# Patient Record
Sex: Female | Born: 1935 | Race: Black or African American | Hispanic: No | Smoking: Never smoker
Health system: Southern US, Community
[De-identification: ages and names within clinical notes are randomized; demographics above are authoritative.]

## PROBLEM LIST (undated history)

## (undated) DIAGNOSIS — I10 Essential (primary) hypertension: Secondary | ICD-10-CM

## (undated) DIAGNOSIS — E119 Type 2 diabetes mellitus without complications: Secondary | ICD-10-CM

## (undated) DIAGNOSIS — N189 Chronic kidney disease, unspecified: Secondary | ICD-10-CM

## (undated) DIAGNOSIS — I4891 Unspecified atrial fibrillation: Secondary | ICD-10-CM

## (undated) DIAGNOSIS — F419 Anxiety disorder, unspecified: Secondary | ICD-10-CM

## (undated) DIAGNOSIS — I509 Heart failure, unspecified: Secondary | ICD-10-CM

## (undated) DIAGNOSIS — I5022 Chronic systolic (congestive) heart failure: Secondary | ICD-10-CM

## (undated) HISTORY — PX: BACK SURGERY: SHX140

## (undated) HISTORY — DX: Unspecified atrial fibrillation: I48.91

## (undated) HISTORY — DX: Chronic kidney disease, unspecified: N18.9

## (undated) HISTORY — DX: Chronic systolic (congestive) heart failure: I50.22

## (undated) HISTORY — PX: EYE SURGERY: SHX253

---

## 2004-10-08 ENCOUNTER — Ambulatory Visit (HOSPITAL_COMMUNITY): Admission: RE | Admit: 2004-10-08 | Discharge: 2004-10-08 | Payer: Self-pay | Admitting: Family Medicine

## 2005-12-29 ENCOUNTER — Ambulatory Visit (HOSPITAL_COMMUNITY): Admission: RE | Admit: 2005-12-29 | Discharge: 2005-12-29 | Payer: Self-pay | Admitting: Family Medicine

## 2006-01-07 ENCOUNTER — Ambulatory Visit (HOSPITAL_COMMUNITY): Admission: RE | Admit: 2006-01-07 | Discharge: 2006-01-08 | Payer: Self-pay | Admitting: Ophthalmology

## 2006-07-06 ENCOUNTER — Emergency Department (HOSPITAL_COMMUNITY): Admission: EM | Admit: 2006-07-06 | Discharge: 2006-07-06 | Payer: Self-pay | Admitting: Emergency Medicine

## 2006-07-14 ENCOUNTER — Ambulatory Visit (HOSPITAL_COMMUNITY): Admission: RE | Admit: 2006-07-14 | Discharge: 2006-07-14 | Payer: Self-pay | Admitting: Family Medicine

## 2006-09-07 ENCOUNTER — Ambulatory Visit (HOSPITAL_COMMUNITY): Admission: RE | Admit: 2006-09-07 | Discharge: 2006-09-08 | Payer: Self-pay | Admitting: Orthopedic Surgery

## 2010-06-10 NOTE — Op Note (Signed)
NAMEEMELEE, KIEPER NO.:  [id number]   MEDICAL RECORD NO.:  [id number]          PATIENT TYPE:  OIB   LOCATION:  [id number]                         FACILITY:  [facility]   PHYSICIAN:  Kipp Brood. Gioffre, M.D.DATE OF BIRTH:  December 04, 1935   DATE OF PROCEDURE:  09/07/2006  DATE OF DISCHARGE:  09/08/2006                               OPERATIVE REPORT   SURGEON:  Jori Moll A. Gladstone Lighter, M.D.   ASSISTANT:  Susa Day, M.D.   PREOPERATIVE DIAGNOSIS:  1. Spinal stenosis with severe stenosis at L5-S1 on the left.  2. Herniated lumbar disc L5-S1 on the left.  This was a foraminal disc      compressing the L5 root.   POSTOPERATIVE DIAGNOSIS:  1. Spinal stenosis with severe stenosis at L5-S1 on the left.  2. Herniated lumbar disc L5-S1 on the left.  This was a foraminal disc      compressing the L5 root.  Note, all her pain was on the left leg      only.   OPERATION:  1. Decompressive lumbar laminectomy at L5-S1 for spinal stenosis.  2. Microdiscectomy and foraminotomy of L5 root and the S1 root at L5-      S1 on the left.  She had a foraminal disc herniation.   PROCEDURE:  Under general anesthesia with the patient on a spinal frame,  routine orthopedic prepping and draping of the back was carried out.  She had 1 gram of IV Ancef preop.  At this time, two needles were placed  on the back for localization purpose and an x-ray was taken.  Once we  identified the L5-S1 interspace, an incision was made.  Bleeders were  identified and cauterized.  We separated the muscle from the spinous  process and the lamina.  Self-retaining retractors were inserted.  Another x-ray was taken to verify our position.  Once we established the  L5-S1 interspace, we then carried out a decompressive lumbar laminectomy  and decompressed the lateral recess.  She had rather significant lateral  recess stenosis on the left.  We did foraminotomy to follow the L5 root  out as well as the foraminotomies for the  S1 root.  We cauterized the  lateral recess veins.  We gently retracted the dura, identified the  root, and then made a cruciate incision into the disc space.  At this  time, we went out far laterally under the root of L5 to decompress the  L5 root.  We did a microdiscectomy.  We then went down into the space  per se and cleaned the space out, as well.  Once the procedure was  completed, we were able to easily pass a hockey stick out the foramina  of both L5 roots and S1 roots on the left.  We had a nice decompression.  We thoroughly irrigated out the area.  Good hemostasis was maintained.  We did check the dura to make sure there were no leaks and there were  none at the time of surgery.  We then loosely applied thrombin soaked  Gelfoam and closed the wound in layers  in the usual fashion.  Note, I  left a small portion of the proximal and distal deep portions of the  wound open for  drainage purposes.  The subcu was closed with 0 Vicryl.  The skin was  closed with metal staples.  A sterile Neosporin dressing was applied.  The patient left the operating room in satisfactory condition.  She had  15 mg IV of Toradol before leaving surgery.           ______________________________  Kipp Brood Gladstone Lighter, M.D.     RAG/MEDQ  D:  09/07/2006  T:  09/08/2006  Job:  [id number]

## 2010-06-13 NOTE — Op Note (Signed)
Barbara Stone, THURNER NO.:  [id number]   MEDICAL RECORD NO.:  [id number]          PATIENT TYPE:  OIB   LOCATION:  [id number]                         FACILITY:  [facility]   PHYSICIAN:  Chrystie Nose. Zigmund Daniel, M.D. DATE OF BIRTH:  Aug 28, 1935   DATE OF PROCEDURE:  01/07/2006  DATE OF DISCHARGE:                               OPERATIVE REPORT   ADMISSION DIAGNOSIS:  Vitreous hemorrhage, proliferative diabetic  retinopathy, traction retinal detachment, neovascularization, left eye.   PROCEDURES:  Pars plana vitrectomy with retinal photocoagulation,  membrane peel, left eye.   SURGEON:  Chrystie Nose. Zigmund Daniel, M.D.   ASSISTANT:  Deatra Ina, MA   ANESTHESIA:  General.   DETAILS:  Usual prep and drape, 25 gauge trocars placed at 10, 2, and 4  o'clock.  Infusion at 4 o'clock.  Provisc placed on the corneal surface.  BIOM viewing system moved into place.  Pars plana vitrectomy was begun  just behind the cataractous lens.  The vitrectomy was carried down  posteriorly to the retinal surface where several areas of traction  detachment were seen on the lower arcade, the upper arcade, and nasally.  At this point, it was decided to convert to 20 gauge in order to have  increased flow and access to 20 gauge  instruments.  The trocars were  removed and held until tight.  Conjunctival peritomies were made at 10,  2 and 4 o'clock.  The 5 mm infusion port anchored into place at 4  o'clock.  The pars plana vitrectomy was then continued with the lighted  pick and the 20 gauge vitreous cutter.  The traction detachment hyaloid  was incised along the lower arcade, along the upper arcade, and nasally.  The hyaloid was teased away from its attachments to the vascular  arcades.  Large clots of blood were encountered and these were carefully  removed under low suction and rapid cutting.  The [place] ophthalmics  brush was used for vacuuming of blood from the surface of the retina.  Each area of traction was  carefully removed with scissors, forceps, and  vitreous cutters. Once all blood and all traction was removed, the  endolaser was positioned in the eye. 1215 burns were placed around the  retinal periphery with a power 1000 milliwatts, 1000 microns each, and  0.1 seconds each.  The blood was vacuumed from the macular surface with  the [place] ophthalmics brush. The vitreous cavity was clear.  The  instruments were removed from the eye.  A 9-0 nylon was used to close  the sclerotomy sites.  The conjunctiva was closed with wet field  cautery.  The wounds were tested and found to be tight.  Polymyxin and  gentamicin were irrigated into Ten[place]'s space.  Atropine solution was  applied.  Marcaine was injected around the globe for postop pain.  Decadron 10 mg was injected to the lower subconjunctival space.  TobraDex ophthalmic ointment, a patch and shield were placed.  Closing  pressure was 15 with a Production manager.  Complications none.  Duration one hour.  The patient was awakened and taken to recovery  in  satisfactory condition.      Chrystie Nose. Zigmund Daniel, M.D.  Electronically Signed    JDM/MEDQ  D:  01/07/2006  T:  01/07/2006  Job:  [id number]

## 2010-08-25 ENCOUNTER — Encounter (INDEPENDENT_AMBULATORY_CARE_PROVIDER_SITE_OTHER): Payer: Medicare Other | Admitting: Ophthalmology

## 2010-08-25 DIAGNOSIS — E11311 Type 2 diabetes mellitus with unspecified diabetic retinopathy with macular edema: Secondary | ICD-10-CM

## 2010-09-10 ENCOUNTER — Encounter (INDEPENDENT_AMBULATORY_CARE_PROVIDER_SITE_OTHER): Payer: Medicare Other | Admitting: Ophthalmology

## 2010-09-10 DIAGNOSIS — H43819 Vitreous degeneration, unspecified eye: Secondary | ICD-10-CM

## 2010-09-10 DIAGNOSIS — E11311 Type 2 diabetes mellitus with unspecified diabetic retinopathy with macular edema: Secondary | ICD-10-CM

## 2010-09-10 DIAGNOSIS — H2510 Age-related nuclear cataract, unspecified eye: Secondary | ICD-10-CM

## 2010-09-10 DIAGNOSIS — E11359 Type 2 diabetes mellitus with proliferative diabetic retinopathy without macular edema: Secondary | ICD-10-CM

## 2010-09-24 ENCOUNTER — Encounter (INDEPENDENT_AMBULATORY_CARE_PROVIDER_SITE_OTHER): Payer: Medicare Other | Admitting: Ophthalmology

## 2010-09-24 DIAGNOSIS — H3581 Retinal edema: Secondary | ICD-10-CM

## 2010-09-24 DIAGNOSIS — H348392 Tributary (branch) retinal vein occlusion, unspecified eye, stable: Secondary | ICD-10-CM

## 2010-10-08 ENCOUNTER — Encounter (INDEPENDENT_AMBULATORY_CARE_PROVIDER_SITE_OTHER): Payer: Medicare Other | Admitting: Ophthalmology

## 2010-10-08 DIAGNOSIS — E11311 Type 2 diabetes mellitus with unspecified diabetic retinopathy with macular edema: Secondary | ICD-10-CM

## 2010-11-05 ENCOUNTER — Encounter (INDEPENDENT_AMBULATORY_CARE_PROVIDER_SITE_OTHER): Payer: Medicare Other | Admitting: Ophthalmology

## 2010-11-05 DIAGNOSIS — E11311 Type 2 diabetes mellitus with unspecified diabetic retinopathy with macular edema: Secondary | ICD-10-CM

## 2010-11-05 DIAGNOSIS — H43819 Vitreous degeneration, unspecified eye: Secondary | ICD-10-CM

## 2010-11-05 DIAGNOSIS — E11359 Type 2 diabetes mellitus with proliferative diabetic retinopathy without macular edema: Secondary | ICD-10-CM

## 2010-11-10 LAB — BASIC METABOLIC PANEL
CO2: 26
Chloride: 108
GFR calc Af Amer: 55 — ABNORMAL LOW
Potassium: 3.9
Sodium: 143

## 2010-11-10 LAB — URINALYSIS, ROUTINE W REFLEX MICROSCOPIC
Hgb urine dipstick: NEGATIVE
Nitrite: NEGATIVE
Specific Gravity, Urine: 1.026
Urobilinogen, UA: 0.2
pH: 5

## 2010-11-10 LAB — PROTIME-INR: INR: 0.9

## 2010-11-10 LAB — APTT: aPTT: 23 — ABNORMAL LOW

## 2010-11-10 LAB — HEMOGLOBIN AND HEMATOCRIT, BLOOD: Hemoglobin: 12.7

## 2010-11-19 ENCOUNTER — Encounter (INDEPENDENT_AMBULATORY_CARE_PROVIDER_SITE_OTHER): Payer: Medicare Other | Admitting: Ophthalmology

## 2010-11-19 DIAGNOSIS — E11359 Type 2 diabetes mellitus with proliferative diabetic retinopathy without macular edema: Secondary | ICD-10-CM

## 2010-11-19 DIAGNOSIS — E11311 Type 2 diabetes mellitus with unspecified diabetic retinopathy with macular edema: Secondary | ICD-10-CM

## 2010-11-19 DIAGNOSIS — H2510 Age-related nuclear cataract, unspecified eye: Secondary | ICD-10-CM

## 2010-11-19 DIAGNOSIS — E1139 Type 2 diabetes mellitus with other diabetic ophthalmic complication: Secondary | ICD-10-CM

## 2010-11-19 DIAGNOSIS — H43819 Vitreous degeneration, unspecified eye: Secondary | ICD-10-CM

## 2010-12-17 ENCOUNTER — Encounter (INDEPENDENT_AMBULATORY_CARE_PROVIDER_SITE_OTHER): Payer: Medicare Other | Admitting: Ophthalmology

## 2010-12-17 DIAGNOSIS — E11359 Type 2 diabetes mellitus with proliferative diabetic retinopathy without macular edema: Secondary | ICD-10-CM

## 2010-12-17 DIAGNOSIS — E1139 Type 2 diabetes mellitus with other diabetic ophthalmic complication: Secondary | ICD-10-CM

## 2010-12-17 DIAGNOSIS — H43819 Vitreous degeneration, unspecified eye: Secondary | ICD-10-CM

## 2010-12-17 DIAGNOSIS — E11311 Type 2 diabetes mellitus with unspecified diabetic retinopathy with macular edema: Secondary | ICD-10-CM

## 2010-12-31 ENCOUNTER — Encounter (INDEPENDENT_AMBULATORY_CARE_PROVIDER_SITE_OTHER): Payer: Medicare Other | Admitting: Ophthalmology

## 2010-12-31 DIAGNOSIS — E11359 Type 2 diabetes mellitus with proliferative diabetic retinopathy without macular edema: Secondary | ICD-10-CM

## 2010-12-31 DIAGNOSIS — H43819 Vitreous degeneration, unspecified eye: Secondary | ICD-10-CM

## 2010-12-31 DIAGNOSIS — E1139 Type 2 diabetes mellitus with other diabetic ophthalmic complication: Secondary | ICD-10-CM

## 2010-12-31 DIAGNOSIS — E11311 Type 2 diabetes mellitus with unspecified diabetic retinopathy with macular edema: Secondary | ICD-10-CM

## 2011-01-28 ENCOUNTER — Encounter (INDEPENDENT_AMBULATORY_CARE_PROVIDER_SITE_OTHER): Payer: Medicare Other | Admitting: Ophthalmology

## 2011-01-28 DIAGNOSIS — H43819 Vitreous degeneration, unspecified eye: Secondary | ICD-10-CM

## 2011-01-28 DIAGNOSIS — H2510 Age-related nuclear cataract, unspecified eye: Secondary | ICD-10-CM

## 2011-01-28 DIAGNOSIS — E11359 Type 2 diabetes mellitus with proliferative diabetic retinopathy without macular edema: Secondary | ICD-10-CM

## 2011-01-28 DIAGNOSIS — E11311 Type 2 diabetes mellitus with unspecified diabetic retinopathy with macular edema: Secondary | ICD-10-CM

## 2011-01-28 DIAGNOSIS — E1165 Type 2 diabetes mellitus with hyperglycemia: Secondary | ICD-10-CM

## 2011-02-11 ENCOUNTER — Encounter (INDEPENDENT_AMBULATORY_CARE_PROVIDER_SITE_OTHER): Payer: Medicare Other | Admitting: Ophthalmology

## 2011-02-11 DIAGNOSIS — E1139 Type 2 diabetes mellitus with other diabetic ophthalmic complication: Secondary | ICD-10-CM

## 2011-02-11 DIAGNOSIS — E11359 Type 2 diabetes mellitus with proliferative diabetic retinopathy without macular edema: Secondary | ICD-10-CM

## 2011-02-11 DIAGNOSIS — E11311 Type 2 diabetes mellitus with unspecified diabetic retinopathy with macular edema: Secondary | ICD-10-CM

## 2011-02-11 DIAGNOSIS — H43819 Vitreous degeneration, unspecified eye: Secondary | ICD-10-CM

## 2011-02-25 ENCOUNTER — Encounter (INDEPENDENT_AMBULATORY_CARE_PROVIDER_SITE_OTHER): Payer: Medicare Other | Admitting: Ophthalmology

## 2011-02-25 DIAGNOSIS — H43819 Vitreous degeneration, unspecified eye: Secondary | ICD-10-CM

## 2011-02-25 DIAGNOSIS — E1139 Type 2 diabetes mellitus with other diabetic ophthalmic complication: Secondary | ICD-10-CM

## 2011-02-25 DIAGNOSIS — E11359 Type 2 diabetes mellitus with proliferative diabetic retinopathy without macular edema: Secondary | ICD-10-CM

## 2011-02-25 DIAGNOSIS — E11311 Type 2 diabetes mellitus with unspecified diabetic retinopathy with macular edema: Secondary | ICD-10-CM

## 2011-03-09 ENCOUNTER — Encounter (INDEPENDENT_AMBULATORY_CARE_PROVIDER_SITE_OTHER): Payer: Medicare Other | Admitting: Ophthalmology

## 2011-03-16 ENCOUNTER — Encounter (INDEPENDENT_AMBULATORY_CARE_PROVIDER_SITE_OTHER): Payer: Medicare Other | Admitting: Ophthalmology

## 2011-03-16 DIAGNOSIS — E11359 Type 2 diabetes mellitus with proliferative diabetic retinopathy without macular edema: Secondary | ICD-10-CM

## 2011-03-16 DIAGNOSIS — H2510 Age-related nuclear cataract, unspecified eye: Secondary | ICD-10-CM

## 2011-03-16 DIAGNOSIS — E1139 Type 2 diabetes mellitus with other diabetic ophthalmic complication: Secondary | ICD-10-CM

## 2011-03-16 DIAGNOSIS — E11311 Type 2 diabetes mellitus with unspecified diabetic retinopathy with macular edema: Secondary | ICD-10-CM

## 2011-03-16 DIAGNOSIS — H43819 Vitreous degeneration, unspecified eye: Secondary | ICD-10-CM

## 2011-03-25 ENCOUNTER — Encounter (INDEPENDENT_AMBULATORY_CARE_PROVIDER_SITE_OTHER): Payer: Medicare Other | Admitting: Ophthalmology

## 2011-03-25 DIAGNOSIS — E11359 Type 2 diabetes mellitus with proliferative diabetic retinopathy without macular edema: Secondary | ICD-10-CM

## 2011-03-25 DIAGNOSIS — E11311 Type 2 diabetes mellitus with unspecified diabetic retinopathy with macular edema: Secondary | ICD-10-CM

## 2011-03-25 DIAGNOSIS — H43819 Vitreous degeneration, unspecified eye: Secondary | ICD-10-CM

## 2011-03-25 DIAGNOSIS — E1165 Type 2 diabetes mellitus with hyperglycemia: Secondary | ICD-10-CM

## 2011-03-25 DIAGNOSIS — H2510 Age-related nuclear cataract, unspecified eye: Secondary | ICD-10-CM

## 2011-04-13 ENCOUNTER — Encounter (INDEPENDENT_AMBULATORY_CARE_PROVIDER_SITE_OTHER): Payer: Medicare Other | Admitting: Ophthalmology

## 2011-04-13 DIAGNOSIS — H43819 Vitreous degeneration, unspecified eye: Secondary | ICD-10-CM

## 2011-04-13 DIAGNOSIS — E11359 Type 2 diabetes mellitus with proliferative diabetic retinopathy without macular edema: Secondary | ICD-10-CM

## 2011-04-13 DIAGNOSIS — E1139 Type 2 diabetes mellitus with other diabetic ophthalmic complication: Secondary | ICD-10-CM

## 2011-04-13 DIAGNOSIS — E11311 Type 2 diabetes mellitus with unspecified diabetic retinopathy with macular edema: Secondary | ICD-10-CM

## 2011-05-06 ENCOUNTER — Encounter (INDEPENDENT_AMBULATORY_CARE_PROVIDER_SITE_OTHER): Payer: Medicare Other | Admitting: Ophthalmology

## 2011-05-07 ENCOUNTER — Encounter (INDEPENDENT_AMBULATORY_CARE_PROVIDER_SITE_OTHER): Payer: Medicare Other | Admitting: Ophthalmology

## 2011-05-07 DIAGNOSIS — E1139 Type 2 diabetes mellitus with other diabetic ophthalmic complication: Secondary | ICD-10-CM

## 2011-05-07 DIAGNOSIS — E11359 Type 2 diabetes mellitus with proliferative diabetic retinopathy without macular edema: Secondary | ICD-10-CM

## 2011-05-07 DIAGNOSIS — H43819 Vitreous degeneration, unspecified eye: Secondary | ICD-10-CM

## 2011-05-07 DIAGNOSIS — E11311 Type 2 diabetes mellitus with unspecified diabetic retinopathy with macular edema: Secondary | ICD-10-CM

## 2011-05-11 ENCOUNTER — Encounter (INDEPENDENT_AMBULATORY_CARE_PROVIDER_SITE_OTHER): Payer: Medicare Other | Admitting: Ophthalmology

## 2011-05-11 DIAGNOSIS — H43819 Vitreous degeneration, unspecified eye: Secondary | ICD-10-CM

## 2011-05-11 DIAGNOSIS — E1165 Type 2 diabetes mellitus with hyperglycemia: Secondary | ICD-10-CM

## 2011-05-11 DIAGNOSIS — E11359 Type 2 diabetes mellitus with proliferative diabetic retinopathy without macular edema: Secondary | ICD-10-CM

## 2011-05-11 DIAGNOSIS — E11311 Type 2 diabetes mellitus with unspecified diabetic retinopathy with macular edema: Secondary | ICD-10-CM

## 2011-06-08 ENCOUNTER — Encounter (INDEPENDENT_AMBULATORY_CARE_PROVIDER_SITE_OTHER): Payer: Medicare Other | Admitting: Ophthalmology

## 2011-06-08 DIAGNOSIS — E11311 Type 2 diabetes mellitus with unspecified diabetic retinopathy with macular edema: Secondary | ICD-10-CM

## 2011-06-08 DIAGNOSIS — E1165 Type 2 diabetes mellitus with hyperglycemia: Secondary | ICD-10-CM

## 2011-06-17 ENCOUNTER — Encounter (INDEPENDENT_AMBULATORY_CARE_PROVIDER_SITE_OTHER): Payer: Medicare Other | Admitting: Ophthalmology

## 2011-06-23 ENCOUNTER — Encounter (INDEPENDENT_AMBULATORY_CARE_PROVIDER_SITE_OTHER): Payer: Medicare Other | Admitting: Ophthalmology

## 2011-06-23 DIAGNOSIS — H43819 Vitreous degeneration, unspecified eye: Secondary | ICD-10-CM

## 2011-06-23 DIAGNOSIS — E11359 Type 2 diabetes mellitus with proliferative diabetic retinopathy without macular edema: Secondary | ICD-10-CM

## 2011-06-23 DIAGNOSIS — E11311 Type 2 diabetes mellitus with unspecified diabetic retinopathy with macular edema: Secondary | ICD-10-CM

## 2011-06-23 DIAGNOSIS — E1139 Type 2 diabetes mellitus with other diabetic ophthalmic complication: Secondary | ICD-10-CM

## 2011-06-23 DIAGNOSIS — H2510 Age-related nuclear cataract, unspecified eye: Secondary | ICD-10-CM

## 2011-07-06 ENCOUNTER — Encounter (INDEPENDENT_AMBULATORY_CARE_PROVIDER_SITE_OTHER): Payer: Medicare Other | Admitting: Ophthalmology

## 2011-07-06 DIAGNOSIS — E1165 Type 2 diabetes mellitus with hyperglycemia: Secondary | ICD-10-CM

## 2011-07-06 DIAGNOSIS — H43819 Vitreous degeneration, unspecified eye: Secondary | ICD-10-CM

## 2011-07-06 DIAGNOSIS — E1139 Type 2 diabetes mellitus with other diabetic ophthalmic complication: Secondary | ICD-10-CM

## 2011-07-06 DIAGNOSIS — E11359 Type 2 diabetes mellitus with proliferative diabetic retinopathy without macular edema: Secondary | ICD-10-CM

## 2011-07-06 DIAGNOSIS — H2510 Age-related nuclear cataract, unspecified eye: Secondary | ICD-10-CM

## 2011-07-06 DIAGNOSIS — E11311 Type 2 diabetes mellitus with unspecified diabetic retinopathy with macular edema: Secondary | ICD-10-CM

## 2011-08-05 ENCOUNTER — Encounter (INDEPENDENT_AMBULATORY_CARE_PROVIDER_SITE_OTHER): Payer: Medicare Other | Admitting: Ophthalmology

## 2011-08-07 ENCOUNTER — Encounter (INDEPENDENT_AMBULATORY_CARE_PROVIDER_SITE_OTHER): Payer: Medicare Other | Admitting: Ophthalmology

## 2011-08-07 DIAGNOSIS — I10 Essential (primary) hypertension: Secondary | ICD-10-CM

## 2011-08-07 DIAGNOSIS — E11359 Type 2 diabetes mellitus with proliferative diabetic retinopathy without macular edema: Secondary | ICD-10-CM

## 2011-08-07 DIAGNOSIS — H35039 Hypertensive retinopathy, unspecified eye: Secondary | ICD-10-CM

## 2011-08-07 DIAGNOSIS — E11311 Type 2 diabetes mellitus with unspecified diabetic retinopathy with macular edema: Secondary | ICD-10-CM

## 2011-08-07 DIAGNOSIS — H43399 Other vitreous opacities, unspecified eye: Secondary | ICD-10-CM

## 2011-08-07 DIAGNOSIS — E1165 Type 2 diabetes mellitus with hyperglycemia: Secondary | ICD-10-CM

## 2011-08-07 DIAGNOSIS — H2510 Age-related nuclear cataract, unspecified eye: Secondary | ICD-10-CM

## 2011-08-10 ENCOUNTER — Encounter (INDEPENDENT_AMBULATORY_CARE_PROVIDER_SITE_OTHER): Payer: Medicare Other | Admitting: Ophthalmology

## 2011-08-10 DIAGNOSIS — E11359 Type 2 diabetes mellitus with proliferative diabetic retinopathy without macular edema: Secondary | ICD-10-CM

## 2011-08-10 DIAGNOSIS — E11311 Type 2 diabetes mellitus with unspecified diabetic retinopathy with macular edema: Secondary | ICD-10-CM

## 2011-08-10 DIAGNOSIS — E1139 Type 2 diabetes mellitus with other diabetic ophthalmic complication: Secondary | ICD-10-CM

## 2011-08-10 DIAGNOSIS — H35039 Hypertensive retinopathy, unspecified eye: Secondary | ICD-10-CM

## 2011-08-10 DIAGNOSIS — I10 Essential (primary) hypertension: Secondary | ICD-10-CM

## 2011-09-14 ENCOUNTER — Encounter (INDEPENDENT_AMBULATORY_CARE_PROVIDER_SITE_OTHER): Payer: Medicare Other | Admitting: Ophthalmology

## 2011-09-14 DIAGNOSIS — H35379 Puckering of macula, unspecified eye: Secondary | ICD-10-CM

## 2011-09-14 DIAGNOSIS — H35039 Hypertensive retinopathy, unspecified eye: Secondary | ICD-10-CM

## 2011-09-14 DIAGNOSIS — E11311 Type 2 diabetes mellitus with unspecified diabetic retinopathy with macular edema: Secondary | ICD-10-CM

## 2011-09-14 DIAGNOSIS — E11359 Type 2 diabetes mellitus with proliferative diabetic retinopathy without macular edema: Secondary | ICD-10-CM

## 2011-09-14 DIAGNOSIS — I10 Essential (primary) hypertension: Secondary | ICD-10-CM

## 2011-09-14 DIAGNOSIS — H2510 Age-related nuclear cataract, unspecified eye: Secondary | ICD-10-CM

## 2011-09-14 DIAGNOSIS — H43819 Vitreous degeneration, unspecified eye: Secondary | ICD-10-CM

## 2011-09-14 DIAGNOSIS — E1139 Type 2 diabetes mellitus with other diabetic ophthalmic complication: Secondary | ICD-10-CM

## 2011-09-18 ENCOUNTER — Encounter (INDEPENDENT_AMBULATORY_CARE_PROVIDER_SITE_OTHER): Payer: Medicare Other | Admitting: Ophthalmology

## 2011-09-21 ENCOUNTER — Encounter (INDEPENDENT_AMBULATORY_CARE_PROVIDER_SITE_OTHER): Payer: Medicare Other | Admitting: Ophthalmology

## 2011-09-21 DIAGNOSIS — E1139 Type 2 diabetes mellitus with other diabetic ophthalmic complication: Secondary | ICD-10-CM

## 2011-09-21 DIAGNOSIS — E11359 Type 2 diabetes mellitus with proliferative diabetic retinopathy without macular edema: Secondary | ICD-10-CM

## 2011-09-21 DIAGNOSIS — H35039 Hypertensive retinopathy, unspecified eye: Secondary | ICD-10-CM

## 2011-09-21 DIAGNOSIS — I10 Essential (primary) hypertension: Secondary | ICD-10-CM

## 2011-09-21 DIAGNOSIS — H43819 Vitreous degeneration, unspecified eye: Secondary | ICD-10-CM

## 2011-09-21 DIAGNOSIS — E11311 Type 2 diabetes mellitus with unspecified diabetic retinopathy with macular edema: Secondary | ICD-10-CM

## 2011-10-19 ENCOUNTER — Encounter (INDEPENDENT_AMBULATORY_CARE_PROVIDER_SITE_OTHER): Payer: Medicare Other | Admitting: Ophthalmology

## 2011-10-19 DIAGNOSIS — H2510 Age-related nuclear cataract, unspecified eye: Secondary | ICD-10-CM

## 2011-10-19 DIAGNOSIS — I10 Essential (primary) hypertension: Secondary | ICD-10-CM

## 2011-10-19 DIAGNOSIS — E11359 Type 2 diabetes mellitus with proliferative diabetic retinopathy without macular edema: Secondary | ICD-10-CM

## 2011-10-19 DIAGNOSIS — H43819 Vitreous degeneration, unspecified eye: Secondary | ICD-10-CM

## 2011-10-19 DIAGNOSIS — H35039 Hypertensive retinopathy, unspecified eye: Secondary | ICD-10-CM

## 2011-10-19 DIAGNOSIS — E11311 Type 2 diabetes mellitus with unspecified diabetic retinopathy with macular edema: Secondary | ICD-10-CM

## 2011-10-19 DIAGNOSIS — E1165 Type 2 diabetes mellitus with hyperglycemia: Secondary | ICD-10-CM

## 2011-11-02 ENCOUNTER — Encounter (INDEPENDENT_AMBULATORY_CARE_PROVIDER_SITE_OTHER): Payer: Medicare Other | Admitting: Ophthalmology

## 2011-11-04 ENCOUNTER — Encounter (INDEPENDENT_AMBULATORY_CARE_PROVIDER_SITE_OTHER): Payer: Medicare Other | Admitting: Ophthalmology

## 2011-11-04 DIAGNOSIS — H35039 Hypertensive retinopathy, unspecified eye: Secondary | ICD-10-CM

## 2011-11-04 DIAGNOSIS — E11311 Type 2 diabetes mellitus with unspecified diabetic retinopathy with macular edema: Secondary | ICD-10-CM

## 2011-11-04 DIAGNOSIS — H43819 Vitreous degeneration, unspecified eye: Secondary | ICD-10-CM

## 2011-11-04 DIAGNOSIS — I10 Essential (primary) hypertension: Secondary | ICD-10-CM

## 2011-11-04 DIAGNOSIS — E11359 Type 2 diabetes mellitus with proliferative diabetic retinopathy without macular edema: Secondary | ICD-10-CM

## 2011-11-04 DIAGNOSIS — E1165 Type 2 diabetes mellitus with hyperglycemia: Secondary | ICD-10-CM

## 2011-11-05 ENCOUNTER — Encounter (INDEPENDENT_AMBULATORY_CARE_PROVIDER_SITE_OTHER): Payer: Medicare Other | Admitting: Ophthalmology

## 2011-11-20 ENCOUNTER — Encounter (INDEPENDENT_AMBULATORY_CARE_PROVIDER_SITE_OTHER): Payer: Medicare Other | Admitting: Ophthalmology

## 2011-11-20 DIAGNOSIS — E11311 Type 2 diabetes mellitus with unspecified diabetic retinopathy with macular edema: Secondary | ICD-10-CM

## 2011-11-20 DIAGNOSIS — H43819 Vitreous degeneration, unspecified eye: Secondary | ICD-10-CM

## 2011-11-20 DIAGNOSIS — H35039 Hypertensive retinopathy, unspecified eye: Secondary | ICD-10-CM

## 2011-11-20 DIAGNOSIS — E11359 Type 2 diabetes mellitus with proliferative diabetic retinopathy without macular edema: Secondary | ICD-10-CM

## 2011-11-20 DIAGNOSIS — H2510 Age-related nuclear cataract, unspecified eye: Secondary | ICD-10-CM

## 2011-11-20 DIAGNOSIS — E1165 Type 2 diabetes mellitus with hyperglycemia: Secondary | ICD-10-CM

## 2011-11-20 DIAGNOSIS — I10 Essential (primary) hypertension: Secondary | ICD-10-CM

## 2011-11-23 ENCOUNTER — Encounter (INDEPENDENT_AMBULATORY_CARE_PROVIDER_SITE_OTHER): Payer: Medicare Other | Admitting: Ophthalmology

## 2011-12-16 ENCOUNTER — Encounter (INDEPENDENT_AMBULATORY_CARE_PROVIDER_SITE_OTHER): Payer: Medicare Other | Admitting: Ophthalmology

## 2011-12-18 ENCOUNTER — Encounter (INDEPENDENT_AMBULATORY_CARE_PROVIDER_SITE_OTHER): Payer: Medicare Other | Admitting: Ophthalmology

## 2011-12-18 DIAGNOSIS — E1165 Type 2 diabetes mellitus with hyperglycemia: Secondary | ICD-10-CM

## 2011-12-18 DIAGNOSIS — I10 Essential (primary) hypertension: Secondary | ICD-10-CM

## 2011-12-18 DIAGNOSIS — H35039 Hypertensive retinopathy, unspecified eye: Secondary | ICD-10-CM

## 2011-12-18 DIAGNOSIS — H2510 Age-related nuclear cataract, unspecified eye: Secondary | ICD-10-CM

## 2011-12-18 DIAGNOSIS — E11359 Type 2 diabetes mellitus with proliferative diabetic retinopathy without macular edema: Secondary | ICD-10-CM

## 2011-12-18 DIAGNOSIS — E11311 Type 2 diabetes mellitus with unspecified diabetic retinopathy with macular edema: Secondary | ICD-10-CM

## 2011-12-18 DIAGNOSIS — H43819 Vitreous degeneration, unspecified eye: Secondary | ICD-10-CM

## 2011-12-21 ENCOUNTER — Encounter (INDEPENDENT_AMBULATORY_CARE_PROVIDER_SITE_OTHER): Payer: Medicare Other | Admitting: Ophthalmology

## 2011-12-21 DIAGNOSIS — E11359 Type 2 diabetes mellitus with proliferative diabetic retinopathy without macular edema: Secondary | ICD-10-CM

## 2011-12-21 DIAGNOSIS — E1139 Type 2 diabetes mellitus with other diabetic ophthalmic complication: Secondary | ICD-10-CM

## 2011-12-21 DIAGNOSIS — E11311 Type 2 diabetes mellitus with unspecified diabetic retinopathy with macular edema: Secondary | ICD-10-CM

## 2011-12-21 DIAGNOSIS — I10 Essential (primary) hypertension: Secondary | ICD-10-CM

## 2011-12-21 DIAGNOSIS — H2510 Age-related nuclear cataract, unspecified eye: Secondary | ICD-10-CM

## 2011-12-21 DIAGNOSIS — H43819 Vitreous degeneration, unspecified eye: Secondary | ICD-10-CM

## 2011-12-21 DIAGNOSIS — H35039 Hypertensive retinopathy, unspecified eye: Secondary | ICD-10-CM

## 2012-01-29 ENCOUNTER — Encounter (INDEPENDENT_AMBULATORY_CARE_PROVIDER_SITE_OTHER): Payer: Medicare Other | Admitting: Ophthalmology

## 2012-01-29 DIAGNOSIS — I10 Essential (primary) hypertension: Secondary | ICD-10-CM

## 2012-01-29 DIAGNOSIS — H43819 Vitreous degeneration, unspecified eye: Secondary | ICD-10-CM

## 2012-01-29 DIAGNOSIS — E11311 Type 2 diabetes mellitus with unspecified diabetic retinopathy with macular edema: Secondary | ICD-10-CM

## 2012-01-29 DIAGNOSIS — E1139 Type 2 diabetes mellitus with other diabetic ophthalmic complication: Secondary | ICD-10-CM

## 2012-01-29 DIAGNOSIS — H35039 Hypertensive retinopathy, unspecified eye: Secondary | ICD-10-CM

## 2012-01-29 DIAGNOSIS — E11359 Type 2 diabetes mellitus with proliferative diabetic retinopathy without macular edema: Secondary | ICD-10-CM

## 2012-02-22 ENCOUNTER — Encounter (INDEPENDENT_AMBULATORY_CARE_PROVIDER_SITE_OTHER): Payer: Medicare Other | Admitting: Ophthalmology

## 2012-02-22 DIAGNOSIS — I10 Essential (primary) hypertension: Secondary | ICD-10-CM

## 2012-02-22 DIAGNOSIS — E1139 Type 2 diabetes mellitus with other diabetic ophthalmic complication: Secondary | ICD-10-CM

## 2012-02-22 DIAGNOSIS — E11359 Type 2 diabetes mellitus with proliferative diabetic retinopathy without macular edema: Secondary | ICD-10-CM

## 2012-02-22 DIAGNOSIS — H2510 Age-related nuclear cataract, unspecified eye: Secondary | ICD-10-CM

## 2012-02-22 DIAGNOSIS — H35039 Hypertensive retinopathy, unspecified eye: Secondary | ICD-10-CM

## 2012-02-22 DIAGNOSIS — H43819 Vitreous degeneration, unspecified eye: Secondary | ICD-10-CM

## 2012-02-22 DIAGNOSIS — E11311 Type 2 diabetes mellitus with unspecified diabetic retinopathy with macular edema: Secondary | ICD-10-CM

## 2012-03-14 ENCOUNTER — Encounter (INDEPENDENT_AMBULATORY_CARE_PROVIDER_SITE_OTHER): Payer: Medicare Other | Admitting: Ophthalmology

## 2012-03-17 ENCOUNTER — Encounter (INDEPENDENT_AMBULATORY_CARE_PROVIDER_SITE_OTHER): Payer: Medicare Other | Admitting: Ophthalmology

## 2012-03-21 ENCOUNTER — Encounter (INDEPENDENT_AMBULATORY_CARE_PROVIDER_SITE_OTHER): Payer: Medicare Other | Admitting: Ophthalmology

## 2012-03-21 DIAGNOSIS — H35039 Hypertensive retinopathy, unspecified eye: Secondary | ICD-10-CM

## 2012-03-21 DIAGNOSIS — E11359 Type 2 diabetes mellitus with proliferative diabetic retinopathy without macular edema: Secondary | ICD-10-CM

## 2012-03-21 DIAGNOSIS — I10 Essential (primary) hypertension: Secondary | ICD-10-CM

## 2012-03-21 DIAGNOSIS — E1139 Type 2 diabetes mellitus with other diabetic ophthalmic complication: Secondary | ICD-10-CM

## 2012-03-21 DIAGNOSIS — H2510 Age-related nuclear cataract, unspecified eye: Secondary | ICD-10-CM

## 2012-03-21 DIAGNOSIS — H43819 Vitreous degeneration, unspecified eye: Secondary | ICD-10-CM

## 2012-03-21 DIAGNOSIS — E11311 Type 2 diabetes mellitus with unspecified diabetic retinopathy with macular edema: Secondary | ICD-10-CM

## 2012-04-25 ENCOUNTER — Encounter (INDEPENDENT_AMBULATORY_CARE_PROVIDER_SITE_OTHER): Payer: Medicare Other | Admitting: Ophthalmology

## 2012-04-25 DIAGNOSIS — E11311 Type 2 diabetes mellitus with unspecified diabetic retinopathy with macular edema: Secondary | ICD-10-CM

## 2012-04-25 DIAGNOSIS — I10 Essential (primary) hypertension: Secondary | ICD-10-CM

## 2012-04-25 DIAGNOSIS — H43819 Vitreous degeneration, unspecified eye: Secondary | ICD-10-CM

## 2012-04-25 DIAGNOSIS — E11359 Type 2 diabetes mellitus with proliferative diabetic retinopathy without macular edema: Secondary | ICD-10-CM

## 2012-04-25 DIAGNOSIS — H35039 Hypertensive retinopathy, unspecified eye: Secondary | ICD-10-CM

## 2012-04-25 DIAGNOSIS — E1139 Type 2 diabetes mellitus with other diabetic ophthalmic complication: Secondary | ICD-10-CM

## 2012-05-03 ENCOUNTER — Encounter (INDEPENDENT_AMBULATORY_CARE_PROVIDER_SITE_OTHER): Payer: Medicare Other | Admitting: Ophthalmology

## 2012-05-03 DIAGNOSIS — E1139 Type 2 diabetes mellitus with other diabetic ophthalmic complication: Secondary | ICD-10-CM

## 2012-05-03 DIAGNOSIS — E11359 Type 2 diabetes mellitus with proliferative diabetic retinopathy without macular edema: Secondary | ICD-10-CM

## 2012-05-03 DIAGNOSIS — E11311 Type 2 diabetes mellitus with unspecified diabetic retinopathy with macular edema: Secondary | ICD-10-CM

## 2012-05-03 DIAGNOSIS — I10 Essential (primary) hypertension: Secondary | ICD-10-CM

## 2012-05-03 DIAGNOSIS — H35039 Hypertensive retinopathy, unspecified eye: Secondary | ICD-10-CM

## 2012-05-03 DIAGNOSIS — H2510 Age-related nuclear cataract, unspecified eye: Secondary | ICD-10-CM

## 2012-05-03 DIAGNOSIS — H43819 Vitreous degeneration, unspecified eye: Secondary | ICD-10-CM

## 2012-06-14 ENCOUNTER — Encounter (INDEPENDENT_AMBULATORY_CARE_PROVIDER_SITE_OTHER): Payer: Medicare Other | Admitting: Ophthalmology

## 2012-06-14 DIAGNOSIS — E1139 Type 2 diabetes mellitus with other diabetic ophthalmic complication: Secondary | ICD-10-CM

## 2012-06-14 DIAGNOSIS — H43819 Vitreous degeneration, unspecified eye: Secondary | ICD-10-CM

## 2012-06-14 DIAGNOSIS — H35039 Hypertensive retinopathy, unspecified eye: Secondary | ICD-10-CM

## 2012-06-14 DIAGNOSIS — I10 Essential (primary) hypertension: Secondary | ICD-10-CM

## 2012-06-14 DIAGNOSIS — E11359 Type 2 diabetes mellitus with proliferative diabetic retinopathy without macular edema: Secondary | ICD-10-CM

## 2012-06-27 ENCOUNTER — Encounter (INDEPENDENT_AMBULATORY_CARE_PROVIDER_SITE_OTHER): Payer: Medicare Other | Admitting: Ophthalmology

## 2012-06-27 DIAGNOSIS — E1139 Type 2 diabetes mellitus with other diabetic ophthalmic complication: Secondary | ICD-10-CM

## 2012-06-27 DIAGNOSIS — H2510 Age-related nuclear cataract, unspecified eye: Secondary | ICD-10-CM

## 2012-06-27 DIAGNOSIS — H35039 Hypertensive retinopathy, unspecified eye: Secondary | ICD-10-CM

## 2012-06-27 DIAGNOSIS — H43819 Vitreous degeneration, unspecified eye: Secondary | ICD-10-CM

## 2012-06-27 DIAGNOSIS — E11311 Type 2 diabetes mellitus with unspecified diabetic retinopathy with macular edema: Secondary | ICD-10-CM

## 2012-06-27 DIAGNOSIS — E11359 Type 2 diabetes mellitus with proliferative diabetic retinopathy without macular edema: Secondary | ICD-10-CM

## 2012-06-27 DIAGNOSIS — I10 Essential (primary) hypertension: Secondary | ICD-10-CM

## 2012-07-19 ENCOUNTER — Encounter (INDEPENDENT_AMBULATORY_CARE_PROVIDER_SITE_OTHER): Payer: Medicare Other | Admitting: Ophthalmology

## 2012-07-19 DIAGNOSIS — H43819 Vitreous degeneration, unspecified eye: Secondary | ICD-10-CM

## 2012-07-19 DIAGNOSIS — H35039 Hypertensive retinopathy, unspecified eye: Secondary | ICD-10-CM

## 2012-07-19 DIAGNOSIS — I10 Essential (primary) hypertension: Secondary | ICD-10-CM

## 2012-07-19 DIAGNOSIS — E11311 Type 2 diabetes mellitus with unspecified diabetic retinopathy with macular edema: Secondary | ICD-10-CM

## 2012-07-19 DIAGNOSIS — H2510 Age-related nuclear cataract, unspecified eye: Secondary | ICD-10-CM

## 2012-07-19 DIAGNOSIS — E1165 Type 2 diabetes mellitus with hyperglycemia: Secondary | ICD-10-CM

## 2012-07-19 DIAGNOSIS — E1139 Type 2 diabetes mellitus with other diabetic ophthalmic complication: Secondary | ICD-10-CM

## 2012-07-19 DIAGNOSIS — E11359 Type 2 diabetes mellitus with proliferative diabetic retinopathy without macular edema: Secondary | ICD-10-CM

## 2012-08-15 ENCOUNTER — Encounter (INDEPENDENT_AMBULATORY_CARE_PROVIDER_SITE_OTHER): Payer: Medicare Other | Admitting: Ophthalmology

## 2012-08-15 DIAGNOSIS — H2510 Age-related nuclear cataract, unspecified eye: Secondary | ICD-10-CM

## 2012-08-15 DIAGNOSIS — I10 Essential (primary) hypertension: Secondary | ICD-10-CM

## 2012-08-15 DIAGNOSIS — E11311 Type 2 diabetes mellitus with unspecified diabetic retinopathy with macular edema: Secondary | ICD-10-CM

## 2012-08-15 DIAGNOSIS — E11359 Type 2 diabetes mellitus with proliferative diabetic retinopathy without macular edema: Secondary | ICD-10-CM

## 2012-08-15 DIAGNOSIS — E1139 Type 2 diabetes mellitus with other diabetic ophthalmic complication: Secondary | ICD-10-CM

## 2012-08-15 DIAGNOSIS — H35039 Hypertensive retinopathy, unspecified eye: Secondary | ICD-10-CM

## 2012-08-15 DIAGNOSIS — H43819 Vitreous degeneration, unspecified eye: Secondary | ICD-10-CM

## 2012-08-30 ENCOUNTER — Encounter (INDEPENDENT_AMBULATORY_CARE_PROVIDER_SITE_OTHER): Payer: Medicare Other | Admitting: Ophthalmology

## 2012-09-15 ENCOUNTER — Encounter (INDEPENDENT_AMBULATORY_CARE_PROVIDER_SITE_OTHER): Payer: Medicare Other | Admitting: Ophthalmology

## 2012-09-15 DIAGNOSIS — I10 Essential (primary) hypertension: Secondary | ICD-10-CM

## 2012-09-15 DIAGNOSIS — E11359 Type 2 diabetes mellitus with proliferative diabetic retinopathy without macular edema: Secondary | ICD-10-CM

## 2012-09-15 DIAGNOSIS — H43819 Vitreous degeneration, unspecified eye: Secondary | ICD-10-CM

## 2012-09-15 DIAGNOSIS — H35039 Hypertensive retinopathy, unspecified eye: Secondary | ICD-10-CM

## 2012-09-15 DIAGNOSIS — E1139 Type 2 diabetes mellitus with other diabetic ophthalmic complication: Secondary | ICD-10-CM

## 2012-09-15 DIAGNOSIS — E11311 Type 2 diabetes mellitus with unspecified diabetic retinopathy with macular edema: Secondary | ICD-10-CM

## 2012-09-19 ENCOUNTER — Encounter (INDEPENDENT_AMBULATORY_CARE_PROVIDER_SITE_OTHER): Payer: Medicare Other | Admitting: Ophthalmology

## 2012-09-19 DIAGNOSIS — I10 Essential (primary) hypertension: Secondary | ICD-10-CM

## 2012-09-19 DIAGNOSIS — H43819 Vitreous degeneration, unspecified eye: Secondary | ICD-10-CM

## 2012-09-19 DIAGNOSIS — E1139 Type 2 diabetes mellitus with other diabetic ophthalmic complication: Secondary | ICD-10-CM

## 2012-09-19 DIAGNOSIS — H35039 Hypertensive retinopathy, unspecified eye: Secondary | ICD-10-CM

## 2012-09-19 DIAGNOSIS — E11359 Type 2 diabetes mellitus with proliferative diabetic retinopathy without macular edema: Secondary | ICD-10-CM

## 2012-09-19 DIAGNOSIS — E11311 Type 2 diabetes mellitus with unspecified diabetic retinopathy with macular edema: Secondary | ICD-10-CM

## 2012-10-25 ENCOUNTER — Encounter (INDEPENDENT_AMBULATORY_CARE_PROVIDER_SITE_OTHER): Payer: Medicare Other | Admitting: Ophthalmology

## 2012-10-25 DIAGNOSIS — H35039 Hypertensive retinopathy, unspecified eye: Secondary | ICD-10-CM

## 2012-10-25 DIAGNOSIS — E11359 Type 2 diabetes mellitus with proliferative diabetic retinopathy without macular edema: Secondary | ICD-10-CM

## 2012-10-25 DIAGNOSIS — H2510 Age-related nuclear cataract, unspecified eye: Secondary | ICD-10-CM

## 2012-10-25 DIAGNOSIS — E1139 Type 2 diabetes mellitus with other diabetic ophthalmic complication: Secondary | ICD-10-CM

## 2012-10-25 DIAGNOSIS — H43819 Vitreous degeneration, unspecified eye: Secondary | ICD-10-CM

## 2012-10-25 DIAGNOSIS — H3581 Retinal edema: Secondary | ICD-10-CM

## 2012-10-25 DIAGNOSIS — I10 Essential (primary) hypertension: Secondary | ICD-10-CM

## 2012-10-27 ENCOUNTER — Encounter (INDEPENDENT_AMBULATORY_CARE_PROVIDER_SITE_OTHER): Payer: Medicare Other | Admitting: Ophthalmology

## 2012-10-31 ENCOUNTER — Encounter (INDEPENDENT_AMBULATORY_CARE_PROVIDER_SITE_OTHER): Payer: Medicare Other | Admitting: Ophthalmology

## 2012-10-31 DIAGNOSIS — I10 Essential (primary) hypertension: Secondary | ICD-10-CM

## 2012-10-31 DIAGNOSIS — E11359 Type 2 diabetes mellitus with proliferative diabetic retinopathy without macular edema: Secondary | ICD-10-CM

## 2012-10-31 DIAGNOSIS — E11311 Type 2 diabetes mellitus with unspecified diabetic retinopathy with macular edema: Secondary | ICD-10-CM

## 2012-10-31 DIAGNOSIS — E1139 Type 2 diabetes mellitus with other diabetic ophthalmic complication: Secondary | ICD-10-CM

## 2012-10-31 DIAGNOSIS — H35039 Hypertensive retinopathy, unspecified eye: Secondary | ICD-10-CM

## 2012-10-31 DIAGNOSIS — H43819 Vitreous degeneration, unspecified eye: Secondary | ICD-10-CM

## 2012-12-26 ENCOUNTER — Encounter (INDEPENDENT_AMBULATORY_CARE_PROVIDER_SITE_OTHER): Payer: Medicare Other | Admitting: Ophthalmology

## 2012-12-26 DIAGNOSIS — I10 Essential (primary) hypertension: Secondary | ICD-10-CM

## 2012-12-26 DIAGNOSIS — H43819 Vitreous degeneration, unspecified eye: Secondary | ICD-10-CM

## 2012-12-26 DIAGNOSIS — E11359 Type 2 diabetes mellitus with proliferative diabetic retinopathy without macular edema: Secondary | ICD-10-CM

## 2012-12-26 DIAGNOSIS — E1139 Type 2 diabetes mellitus with other diabetic ophthalmic complication: Secondary | ICD-10-CM

## 2012-12-26 DIAGNOSIS — H35039 Hypertensive retinopathy, unspecified eye: Secondary | ICD-10-CM

## 2012-12-26 DIAGNOSIS — E11311 Type 2 diabetes mellitus with unspecified diabetic retinopathy with macular edema: Secondary | ICD-10-CM

## 2012-12-26 DIAGNOSIS — H2510 Age-related nuclear cataract, unspecified eye: Secondary | ICD-10-CM

## 2013-02-27 ENCOUNTER — Encounter (INDEPENDENT_AMBULATORY_CARE_PROVIDER_SITE_OTHER): Payer: Medicare Other | Admitting: Ophthalmology

## 2013-02-27 DIAGNOSIS — E11359 Type 2 diabetes mellitus with proliferative diabetic retinopathy without macular edema: Secondary | ICD-10-CM

## 2013-02-27 DIAGNOSIS — H35039 Hypertensive retinopathy, unspecified eye: Secondary | ICD-10-CM

## 2013-02-27 DIAGNOSIS — H2510 Age-related nuclear cataract, unspecified eye: Secondary | ICD-10-CM

## 2013-02-27 DIAGNOSIS — I10 Essential (primary) hypertension: Secondary | ICD-10-CM

## 2013-02-27 DIAGNOSIS — H43819 Vitreous degeneration, unspecified eye: Secondary | ICD-10-CM

## 2013-02-27 DIAGNOSIS — E11311 Type 2 diabetes mellitus with unspecified diabetic retinopathy with macular edema: Secondary | ICD-10-CM

## 2013-02-27 DIAGNOSIS — E1165 Type 2 diabetes mellitus with hyperglycemia: Secondary | ICD-10-CM

## 2013-02-27 DIAGNOSIS — E1139 Type 2 diabetes mellitus with other diabetic ophthalmic complication: Secondary | ICD-10-CM

## 2013-03-14 ENCOUNTER — Encounter (INDEPENDENT_AMBULATORY_CARE_PROVIDER_SITE_OTHER): Payer: Medicare Other | Admitting: Ophthalmology

## 2013-03-20 ENCOUNTER — Encounter (INDEPENDENT_AMBULATORY_CARE_PROVIDER_SITE_OTHER): Payer: Medicare Other | Admitting: Ophthalmology

## 2013-03-20 DIAGNOSIS — E1139 Type 2 diabetes mellitus with other diabetic ophthalmic complication: Secondary | ICD-10-CM

## 2013-03-20 DIAGNOSIS — H2510 Age-related nuclear cataract, unspecified eye: Secondary | ICD-10-CM

## 2013-03-20 DIAGNOSIS — I10 Essential (primary) hypertension: Secondary | ICD-10-CM

## 2013-03-20 DIAGNOSIS — E1165 Type 2 diabetes mellitus with hyperglycemia: Secondary | ICD-10-CM

## 2013-03-20 DIAGNOSIS — H43819 Vitreous degeneration, unspecified eye: Secondary | ICD-10-CM

## 2013-03-20 DIAGNOSIS — E11359 Type 2 diabetes mellitus with proliferative diabetic retinopathy without macular edema: Secondary | ICD-10-CM

## 2013-03-20 DIAGNOSIS — H35039 Hypertensive retinopathy, unspecified eye: Secondary | ICD-10-CM

## 2013-04-18 ENCOUNTER — Encounter (INDEPENDENT_AMBULATORY_CARE_PROVIDER_SITE_OTHER): Payer: Medicare Other | Admitting: Ophthalmology

## 2013-04-18 DIAGNOSIS — E1139 Type 2 diabetes mellitus with other diabetic ophthalmic complication: Secondary | ICD-10-CM

## 2013-04-18 DIAGNOSIS — H43819 Vitreous degeneration, unspecified eye: Secondary | ICD-10-CM

## 2013-04-18 DIAGNOSIS — I10 Essential (primary) hypertension: Secondary | ICD-10-CM

## 2013-04-18 DIAGNOSIS — E11311 Type 2 diabetes mellitus with unspecified diabetic retinopathy with macular edema: Secondary | ICD-10-CM

## 2013-04-18 DIAGNOSIS — E11359 Type 2 diabetes mellitus with proliferative diabetic retinopathy without macular edema: Secondary | ICD-10-CM

## 2013-04-18 DIAGNOSIS — H35039 Hypertensive retinopathy, unspecified eye: Secondary | ICD-10-CM

## 2013-04-18 DIAGNOSIS — E1165 Type 2 diabetes mellitus with hyperglycemia: Secondary | ICD-10-CM

## 2013-04-24 ENCOUNTER — Encounter (INDEPENDENT_AMBULATORY_CARE_PROVIDER_SITE_OTHER): Payer: Medicare Other | Admitting: Ophthalmology

## 2013-04-25 ENCOUNTER — Encounter (INDEPENDENT_AMBULATORY_CARE_PROVIDER_SITE_OTHER): Payer: Medicare Other | Admitting: Ophthalmology

## 2013-04-25 DIAGNOSIS — E11311 Type 2 diabetes mellitus with unspecified diabetic retinopathy with macular edema: Secondary | ICD-10-CM

## 2013-04-25 DIAGNOSIS — E11359 Type 2 diabetes mellitus with proliferative diabetic retinopathy without macular edema: Secondary | ICD-10-CM

## 2013-04-25 DIAGNOSIS — H2510 Age-related nuclear cataract, unspecified eye: Secondary | ICD-10-CM

## 2013-04-25 DIAGNOSIS — H35039 Hypertensive retinopathy, unspecified eye: Secondary | ICD-10-CM

## 2013-04-25 DIAGNOSIS — H43819 Vitreous degeneration, unspecified eye: Secondary | ICD-10-CM

## 2013-04-25 DIAGNOSIS — I10 Essential (primary) hypertension: Secondary | ICD-10-CM

## 2013-04-25 DIAGNOSIS — E1139 Type 2 diabetes mellitus with other diabetic ophthalmic complication: Secondary | ICD-10-CM

## 2013-04-25 DIAGNOSIS — E1165 Type 2 diabetes mellitus with hyperglycemia: Secondary | ICD-10-CM

## 2013-05-16 ENCOUNTER — Encounter (INDEPENDENT_AMBULATORY_CARE_PROVIDER_SITE_OTHER): Payer: Medicare Other | Admitting: Ophthalmology

## 2013-05-16 DIAGNOSIS — H43819 Vitreous degeneration, unspecified eye: Secondary | ICD-10-CM

## 2013-05-16 DIAGNOSIS — E11359 Type 2 diabetes mellitus with proliferative diabetic retinopathy without macular edema: Secondary | ICD-10-CM

## 2013-05-16 DIAGNOSIS — I10 Essential (primary) hypertension: Secondary | ICD-10-CM

## 2013-05-16 DIAGNOSIS — E11311 Type 2 diabetes mellitus with unspecified diabetic retinopathy with macular edema: Secondary | ICD-10-CM

## 2013-05-16 DIAGNOSIS — H35039 Hypertensive retinopathy, unspecified eye: Secondary | ICD-10-CM

## 2013-05-16 DIAGNOSIS — E1139 Type 2 diabetes mellitus with other diabetic ophthalmic complication: Secondary | ICD-10-CM

## 2013-05-16 DIAGNOSIS — E1165 Type 2 diabetes mellitus with hyperglycemia: Secondary | ICD-10-CM

## 2013-06-13 ENCOUNTER — Encounter (INDEPENDENT_AMBULATORY_CARE_PROVIDER_SITE_OTHER): Payer: Medicare Other | Admitting: Ophthalmology

## 2013-06-13 DIAGNOSIS — E11311 Type 2 diabetes mellitus with unspecified diabetic retinopathy with macular edema: Secondary | ICD-10-CM

## 2013-06-13 DIAGNOSIS — I10 Essential (primary) hypertension: Secondary | ICD-10-CM

## 2013-06-13 DIAGNOSIS — E1139 Type 2 diabetes mellitus with other diabetic ophthalmic complication: Secondary | ICD-10-CM

## 2013-06-13 DIAGNOSIS — E11359 Type 2 diabetes mellitus with proliferative diabetic retinopathy without macular edema: Secondary | ICD-10-CM

## 2013-06-13 DIAGNOSIS — H35039 Hypertensive retinopathy, unspecified eye: Secondary | ICD-10-CM

## 2013-06-13 DIAGNOSIS — E1165 Type 2 diabetes mellitus with hyperglycemia: Secondary | ICD-10-CM

## 2013-07-11 ENCOUNTER — Encounter (INDEPENDENT_AMBULATORY_CARE_PROVIDER_SITE_OTHER): Payer: Medicare Other | Admitting: Ophthalmology

## 2013-07-11 DIAGNOSIS — H2510 Age-related nuclear cataract, unspecified eye: Secondary | ICD-10-CM

## 2013-07-11 DIAGNOSIS — H43819 Vitreous degeneration, unspecified eye: Secondary | ICD-10-CM

## 2013-07-11 DIAGNOSIS — E1139 Type 2 diabetes mellitus with other diabetic ophthalmic complication: Secondary | ICD-10-CM

## 2013-07-11 DIAGNOSIS — E1165 Type 2 diabetes mellitus with hyperglycemia: Secondary | ICD-10-CM

## 2013-07-11 DIAGNOSIS — E11311 Type 2 diabetes mellitus with unspecified diabetic retinopathy with macular edema: Secondary | ICD-10-CM

## 2013-07-11 DIAGNOSIS — E11359 Type 2 diabetes mellitus with proliferative diabetic retinopathy without macular edema: Secondary | ICD-10-CM

## 2013-07-11 DIAGNOSIS — H35039 Hypertensive retinopathy, unspecified eye: Secondary | ICD-10-CM

## 2013-07-11 DIAGNOSIS — I10 Essential (primary) hypertension: Secondary | ICD-10-CM

## 2013-08-08 ENCOUNTER — Encounter (INDEPENDENT_AMBULATORY_CARE_PROVIDER_SITE_OTHER): Payer: Medicare Other | Admitting: Ophthalmology

## 2013-08-08 DIAGNOSIS — H43819 Vitreous degeneration, unspecified eye: Secondary | ICD-10-CM

## 2013-08-08 DIAGNOSIS — E11311 Type 2 diabetes mellitus with unspecified diabetic retinopathy with macular edema: Secondary | ICD-10-CM

## 2013-08-08 DIAGNOSIS — E1165 Type 2 diabetes mellitus with hyperglycemia: Secondary | ICD-10-CM

## 2013-08-08 DIAGNOSIS — E1139 Type 2 diabetes mellitus with other diabetic ophthalmic complication: Secondary | ICD-10-CM

## 2013-08-08 DIAGNOSIS — H35039 Hypertensive retinopathy, unspecified eye: Secondary | ICD-10-CM

## 2013-08-08 DIAGNOSIS — I10 Essential (primary) hypertension: Secondary | ICD-10-CM

## 2013-08-08 DIAGNOSIS — H2510 Age-related nuclear cataract, unspecified eye: Secondary | ICD-10-CM

## 2013-08-08 DIAGNOSIS — E11359 Type 2 diabetes mellitus with proliferative diabetic retinopathy without macular edema: Secondary | ICD-10-CM

## 2013-09-05 ENCOUNTER — Encounter (INDEPENDENT_AMBULATORY_CARE_PROVIDER_SITE_OTHER): Payer: Medicare Other | Admitting: Ophthalmology

## 2013-09-05 DIAGNOSIS — E11311 Type 2 diabetes mellitus with unspecified diabetic retinopathy with macular edema: Secondary | ICD-10-CM

## 2013-09-05 DIAGNOSIS — H2510 Age-related nuclear cataract, unspecified eye: Secondary | ICD-10-CM

## 2013-09-05 DIAGNOSIS — H43819 Vitreous degeneration, unspecified eye: Secondary | ICD-10-CM

## 2013-09-05 DIAGNOSIS — E11359 Type 2 diabetes mellitus with proliferative diabetic retinopathy without macular edema: Secondary | ICD-10-CM

## 2013-09-05 DIAGNOSIS — I10 Essential (primary) hypertension: Secondary | ICD-10-CM

## 2013-09-05 DIAGNOSIS — E1165 Type 2 diabetes mellitus with hyperglycemia: Secondary | ICD-10-CM

## 2013-09-05 DIAGNOSIS — H35039 Hypertensive retinopathy, unspecified eye: Secondary | ICD-10-CM

## 2013-09-05 DIAGNOSIS — E1139 Type 2 diabetes mellitus with other diabetic ophthalmic complication: Secondary | ICD-10-CM

## 2013-10-24 ENCOUNTER — Encounter (INDEPENDENT_AMBULATORY_CARE_PROVIDER_SITE_OTHER): Payer: Medicare Other | Admitting: Ophthalmology

## 2013-10-24 DIAGNOSIS — I10 Essential (primary) hypertension: Secondary | ICD-10-CM

## 2013-10-24 DIAGNOSIS — E1139 Type 2 diabetes mellitus with other diabetic ophthalmic complication: Secondary | ICD-10-CM

## 2013-10-24 DIAGNOSIS — H43819 Vitreous degeneration, unspecified eye: Secondary | ICD-10-CM

## 2013-10-24 DIAGNOSIS — H35039 Hypertensive retinopathy, unspecified eye: Secondary | ICD-10-CM

## 2013-10-24 DIAGNOSIS — E1165 Type 2 diabetes mellitus with hyperglycemia: Secondary | ICD-10-CM

## 2013-10-24 DIAGNOSIS — H2510 Age-related nuclear cataract, unspecified eye: Secondary | ICD-10-CM

## 2013-10-24 DIAGNOSIS — E11311 Type 2 diabetes mellitus with unspecified diabetic retinopathy with macular edema: Secondary | ICD-10-CM

## 2013-10-24 DIAGNOSIS — E11359 Type 2 diabetes mellitus with proliferative diabetic retinopathy without macular edema: Secondary | ICD-10-CM

## 2013-12-12 ENCOUNTER — Encounter (INDEPENDENT_AMBULATORY_CARE_PROVIDER_SITE_OTHER): Payer: Medicare Other | Admitting: Ophthalmology

## 2013-12-13 ENCOUNTER — Encounter (INDEPENDENT_AMBULATORY_CARE_PROVIDER_SITE_OTHER): Payer: Medicare Other | Admitting: Ophthalmology

## 2013-12-13 DIAGNOSIS — E11311 Type 2 diabetes mellitus with unspecified diabetic retinopathy with macular edema: Secondary | ICD-10-CM

## 2013-12-13 DIAGNOSIS — H43811 Vitreous degeneration, right eye: Secondary | ICD-10-CM

## 2013-12-13 DIAGNOSIS — I10 Essential (primary) hypertension: Secondary | ICD-10-CM

## 2013-12-13 DIAGNOSIS — H2511 Age-related nuclear cataract, right eye: Secondary | ICD-10-CM

## 2013-12-13 DIAGNOSIS — H35033 Hypertensive retinopathy, bilateral: Secondary | ICD-10-CM

## 2013-12-13 DIAGNOSIS — E11351 Type 2 diabetes mellitus with proliferative diabetic retinopathy with macular edema: Secondary | ICD-10-CM

## 2013-12-25 ENCOUNTER — Ambulatory Visit (INDEPENDENT_AMBULATORY_CARE_PROVIDER_SITE_OTHER): Payer: Medicare Other | Admitting: Ophthalmology

## 2013-12-25 DIAGNOSIS — E11311 Type 2 diabetes mellitus with unspecified diabetic retinopathy with macular edema: Secondary | ICD-10-CM

## 2013-12-25 DIAGNOSIS — E11351 Type 2 diabetes mellitus with proliferative diabetic retinopathy with macular edema: Secondary | ICD-10-CM

## 2014-01-30 ENCOUNTER — Encounter (INDEPENDENT_AMBULATORY_CARE_PROVIDER_SITE_OTHER): Payer: Medicare Other | Admitting: Ophthalmology

## 2014-01-30 DIAGNOSIS — H43811 Vitreous degeneration, right eye: Secondary | ICD-10-CM

## 2014-01-30 DIAGNOSIS — E11351 Type 2 diabetes mellitus with proliferative diabetic retinopathy with macular edema: Secondary | ICD-10-CM

## 2014-01-30 DIAGNOSIS — I10 Essential (primary) hypertension: Secondary | ICD-10-CM

## 2014-01-30 DIAGNOSIS — E11311 Type 2 diabetes mellitus with unspecified diabetic retinopathy with macular edema: Secondary | ICD-10-CM

## 2014-01-30 DIAGNOSIS — H35033 Hypertensive retinopathy, bilateral: Secondary | ICD-10-CM

## 2014-01-30 DIAGNOSIS — H2511 Age-related nuclear cataract, right eye: Secondary | ICD-10-CM

## 2014-01-31 ENCOUNTER — Encounter (INDEPENDENT_AMBULATORY_CARE_PROVIDER_SITE_OTHER): Payer: Medicare Other | Admitting: Ophthalmology

## 2014-03-27 ENCOUNTER — Encounter (INDEPENDENT_AMBULATORY_CARE_PROVIDER_SITE_OTHER): Payer: Medicare Other | Admitting: Ophthalmology

## 2014-03-27 DIAGNOSIS — E11351 Type 2 diabetes mellitus with proliferative diabetic retinopathy with macular edema: Secondary | ICD-10-CM | POA: Diagnosis not present

## 2014-03-27 DIAGNOSIS — H35033 Hypertensive retinopathy, bilateral: Secondary | ICD-10-CM | POA: Diagnosis not present

## 2014-03-27 DIAGNOSIS — E11311 Type 2 diabetes mellitus with unspecified diabetic retinopathy with macular edema: Secondary | ICD-10-CM | POA: Diagnosis not present

## 2014-03-27 DIAGNOSIS — H43812 Vitreous degeneration, left eye: Secondary | ICD-10-CM

## 2014-03-27 DIAGNOSIS — I10 Essential (primary) hypertension: Secondary | ICD-10-CM

## 2014-05-22 ENCOUNTER — Encounter (INDEPENDENT_AMBULATORY_CARE_PROVIDER_SITE_OTHER): Payer: Medicare Other | Admitting: Ophthalmology

## 2014-05-25 ENCOUNTER — Encounter (INDEPENDENT_AMBULATORY_CARE_PROVIDER_SITE_OTHER): Payer: Medicare Other | Admitting: Ophthalmology

## 2014-05-25 DIAGNOSIS — H35033 Hypertensive retinopathy, bilateral: Secondary | ICD-10-CM

## 2014-05-25 DIAGNOSIS — H43813 Vitreous degeneration, bilateral: Secondary | ICD-10-CM

## 2014-05-25 DIAGNOSIS — H35372 Puckering of macula, left eye: Secondary | ICD-10-CM | POA: Diagnosis not present

## 2014-05-25 DIAGNOSIS — E11351 Type 2 diabetes mellitus with proliferative diabetic retinopathy with macular edema: Secondary | ICD-10-CM | POA: Diagnosis not present

## 2014-05-25 DIAGNOSIS — E11311 Type 2 diabetes mellitus with unspecified diabetic retinopathy with macular edema: Secondary | ICD-10-CM | POA: Diagnosis not present

## 2014-05-25 DIAGNOSIS — E11359 Type 2 diabetes mellitus with proliferative diabetic retinopathy without macular edema: Secondary | ICD-10-CM | POA: Diagnosis not present

## 2014-07-24 ENCOUNTER — Encounter (INDEPENDENT_AMBULATORY_CARE_PROVIDER_SITE_OTHER): Payer: Medicare Other | Admitting: Ophthalmology

## 2014-07-24 DIAGNOSIS — E11311 Type 2 diabetes mellitus with unspecified diabetic retinopathy with macular edema: Secondary | ICD-10-CM

## 2014-07-24 DIAGNOSIS — I10 Essential (primary) hypertension: Secondary | ICD-10-CM

## 2014-07-24 DIAGNOSIS — H43813 Vitreous degeneration, bilateral: Secondary | ICD-10-CM | POA: Diagnosis not present

## 2014-07-24 DIAGNOSIS — E11351 Type 2 diabetes mellitus with proliferative diabetic retinopathy with macular edema: Secondary | ICD-10-CM

## 2014-07-27 ENCOUNTER — Encounter (INDEPENDENT_AMBULATORY_CARE_PROVIDER_SITE_OTHER): Payer: Medicare Other | Admitting: Ophthalmology

## 2014-09-25 ENCOUNTER — Encounter (INDEPENDENT_AMBULATORY_CARE_PROVIDER_SITE_OTHER): Payer: Medicare Other | Admitting: Ophthalmology

## 2014-10-02 ENCOUNTER — Encounter (INDEPENDENT_AMBULATORY_CARE_PROVIDER_SITE_OTHER): Payer: Medicare Other | Admitting: Ophthalmology

## 2014-10-02 DIAGNOSIS — H35033 Hypertensive retinopathy, bilateral: Secondary | ICD-10-CM | POA: Diagnosis not present

## 2014-10-02 DIAGNOSIS — E11351 Type 2 diabetes mellitus with proliferative diabetic retinopathy with macular edema: Secondary | ICD-10-CM

## 2014-10-02 DIAGNOSIS — H43813 Vitreous degeneration, bilateral: Secondary | ICD-10-CM

## 2014-10-02 DIAGNOSIS — I10 Essential (primary) hypertension: Secondary | ICD-10-CM | POA: Diagnosis not present

## 2014-10-02 DIAGNOSIS — E11311 Type 2 diabetes mellitus with unspecified diabetic retinopathy with macular edema: Secondary | ICD-10-CM

## 2014-10-02 DIAGNOSIS — H35372 Puckering of macula, left eye: Secondary | ICD-10-CM

## 2014-10-16 ENCOUNTER — Encounter (INDEPENDENT_AMBULATORY_CARE_PROVIDER_SITE_OTHER): Payer: Medicare Other | Admitting: Ophthalmology

## 2014-10-16 DIAGNOSIS — E11311 Type 2 diabetes mellitus with unspecified diabetic retinopathy with macular edema: Secondary | ICD-10-CM

## 2014-10-16 DIAGNOSIS — E11351 Type 2 diabetes mellitus with proliferative diabetic retinopathy with macular edema: Secondary | ICD-10-CM | POA: Diagnosis not present

## 2014-11-22 ENCOUNTER — Encounter (INDEPENDENT_AMBULATORY_CARE_PROVIDER_SITE_OTHER): Payer: Medicare Other | Admitting: Ophthalmology

## 2014-11-22 DIAGNOSIS — E113513 Type 2 diabetes mellitus with proliferative diabetic retinopathy with macular edema, bilateral: Secondary | ICD-10-CM

## 2014-11-22 DIAGNOSIS — H2511 Age-related nuclear cataract, right eye: Secondary | ICD-10-CM | POA: Diagnosis not present

## 2014-11-22 DIAGNOSIS — I10 Essential (primary) hypertension: Secondary | ICD-10-CM

## 2014-11-22 DIAGNOSIS — E11311 Type 2 diabetes mellitus with unspecified diabetic retinopathy with macular edema: Secondary | ICD-10-CM | POA: Diagnosis not present

## 2014-11-22 DIAGNOSIS — H35033 Hypertensive retinopathy, bilateral: Secondary | ICD-10-CM

## 2014-11-22 DIAGNOSIS — H43811 Vitreous degeneration, right eye: Secondary | ICD-10-CM | POA: Diagnosis not present

## 2014-12-04 ENCOUNTER — Encounter (INDEPENDENT_AMBULATORY_CARE_PROVIDER_SITE_OTHER): Payer: Medicare Other | Admitting: Ophthalmology

## 2014-12-05 ENCOUNTER — Encounter (INDEPENDENT_AMBULATORY_CARE_PROVIDER_SITE_OTHER): Payer: Medicare Other | Admitting: Ophthalmology

## 2014-12-05 DIAGNOSIS — E11311 Type 2 diabetes mellitus with unspecified diabetic retinopathy with macular edema: Secondary | ICD-10-CM

## 2014-12-05 DIAGNOSIS — I10 Essential (primary) hypertension: Secondary | ICD-10-CM | POA: Diagnosis not present

## 2014-12-05 DIAGNOSIS — H2511 Age-related nuclear cataract, right eye: Secondary | ICD-10-CM | POA: Diagnosis not present

## 2014-12-05 DIAGNOSIS — H43811 Vitreous degeneration, right eye: Secondary | ICD-10-CM

## 2014-12-05 DIAGNOSIS — E113513 Type 2 diabetes mellitus with proliferative diabetic retinopathy with macular edema, bilateral: Secondary | ICD-10-CM

## 2014-12-05 DIAGNOSIS — H35033 Hypertensive retinopathy, bilateral: Secondary | ICD-10-CM | POA: Diagnosis not present

## 2014-12-18 ENCOUNTER — Encounter (INDEPENDENT_AMBULATORY_CARE_PROVIDER_SITE_OTHER): Payer: Medicare Other | Admitting: Ophthalmology

## 2014-12-19 ENCOUNTER — Encounter (INDEPENDENT_AMBULATORY_CARE_PROVIDER_SITE_OTHER): Payer: Medicare Other | Admitting: Ophthalmology

## 2014-12-19 DIAGNOSIS — H35033 Hypertensive retinopathy, bilateral: Secondary | ICD-10-CM

## 2014-12-19 DIAGNOSIS — E11311 Type 2 diabetes mellitus with unspecified diabetic retinopathy with macular edema: Secondary | ICD-10-CM | POA: Diagnosis not present

## 2014-12-19 DIAGNOSIS — H43811 Vitreous degeneration, right eye: Secondary | ICD-10-CM

## 2014-12-19 DIAGNOSIS — I10 Essential (primary) hypertension: Secondary | ICD-10-CM | POA: Diagnosis not present

## 2014-12-19 DIAGNOSIS — E113513 Type 2 diabetes mellitus with proliferative diabetic retinopathy with macular edema, bilateral: Secondary | ICD-10-CM

## 2014-12-26 ENCOUNTER — Encounter (INDEPENDENT_AMBULATORY_CARE_PROVIDER_SITE_OTHER): Payer: Medicare Other | Admitting: Ophthalmology

## 2015-01-16 ENCOUNTER — Encounter (INDEPENDENT_AMBULATORY_CARE_PROVIDER_SITE_OTHER): Payer: Medicare Other | Admitting: Ophthalmology

## 2015-01-16 DIAGNOSIS — I10 Essential (primary) hypertension: Secondary | ICD-10-CM | POA: Diagnosis not present

## 2015-01-16 DIAGNOSIS — E11311 Type 2 diabetes mellitus with unspecified diabetic retinopathy with macular edema: Secondary | ICD-10-CM

## 2015-01-16 DIAGNOSIS — H35033 Hypertensive retinopathy, bilateral: Secondary | ICD-10-CM

## 2015-01-16 DIAGNOSIS — H43813 Vitreous degeneration, bilateral: Secondary | ICD-10-CM | POA: Diagnosis not present

## 2015-01-16 DIAGNOSIS — E113513 Type 2 diabetes mellitus with proliferative diabetic retinopathy with macular edema, bilateral: Secondary | ICD-10-CM

## 2015-02-05 ENCOUNTER — Encounter (INDEPENDENT_AMBULATORY_CARE_PROVIDER_SITE_OTHER): Payer: Medicare Other | Admitting: Ophthalmology

## 2015-02-11 ENCOUNTER — Encounter (INDEPENDENT_AMBULATORY_CARE_PROVIDER_SITE_OTHER): Payer: Medicare Other | Admitting: Ophthalmology

## 2015-02-11 DIAGNOSIS — H35033 Hypertensive retinopathy, bilateral: Secondary | ICD-10-CM

## 2015-02-11 DIAGNOSIS — E11311 Type 2 diabetes mellitus with unspecified diabetic retinopathy with macular edema: Secondary | ICD-10-CM

## 2015-02-11 DIAGNOSIS — E113513 Type 2 diabetes mellitus with proliferative diabetic retinopathy with macular edema, bilateral: Secondary | ICD-10-CM

## 2015-02-11 DIAGNOSIS — I10 Essential (primary) hypertension: Secondary | ICD-10-CM | POA: Diagnosis not present

## 2015-02-11 DIAGNOSIS — H43813 Vitreous degeneration, bilateral: Secondary | ICD-10-CM

## 2015-02-28 ENCOUNTER — Encounter (INDEPENDENT_AMBULATORY_CARE_PROVIDER_SITE_OTHER): Payer: Medicare Other | Admitting: Ophthalmology

## 2015-03-11 ENCOUNTER — Encounter (INDEPENDENT_AMBULATORY_CARE_PROVIDER_SITE_OTHER): Payer: Medicare Other | Admitting: Ophthalmology

## 2015-03-11 DIAGNOSIS — H35033 Hypertensive retinopathy, bilateral: Secondary | ICD-10-CM

## 2015-03-11 DIAGNOSIS — H43813 Vitreous degeneration, bilateral: Secondary | ICD-10-CM

## 2015-03-11 DIAGNOSIS — E113513 Type 2 diabetes mellitus with proliferative diabetic retinopathy with macular edema, bilateral: Secondary | ICD-10-CM

## 2015-03-11 DIAGNOSIS — E11311 Type 2 diabetes mellitus with unspecified diabetic retinopathy with macular edema: Secondary | ICD-10-CM | POA: Diagnosis not present

## 2015-03-11 DIAGNOSIS — H2511 Age-related nuclear cataract, right eye: Secondary | ICD-10-CM

## 2015-03-11 DIAGNOSIS — I10 Essential (primary) hypertension: Secondary | ICD-10-CM | POA: Diagnosis not present

## 2015-04-05 ENCOUNTER — Other Ambulatory Visit (HOSPITAL_COMMUNITY): Payer: Self-pay | Admitting: Family Medicine

## 2015-04-05 DIAGNOSIS — Z1231 Encounter for screening mammogram for malignant neoplasm of breast: Secondary | ICD-10-CM

## 2015-04-15 ENCOUNTER — Encounter (INDEPENDENT_AMBULATORY_CARE_PROVIDER_SITE_OTHER): Payer: Medicare Other | Admitting: Ophthalmology

## 2015-04-15 DIAGNOSIS — E11311 Type 2 diabetes mellitus with unspecified diabetic retinopathy with macular edema: Secondary | ICD-10-CM | POA: Diagnosis not present

## 2015-04-15 DIAGNOSIS — H2511 Age-related nuclear cataract, right eye: Secondary | ICD-10-CM | POA: Diagnosis not present

## 2015-04-15 DIAGNOSIS — E113513 Type 2 diabetes mellitus with proliferative diabetic retinopathy with macular edema, bilateral: Secondary | ICD-10-CM

## 2015-04-15 DIAGNOSIS — H43813 Vitreous degeneration, bilateral: Secondary | ICD-10-CM

## 2015-04-15 DIAGNOSIS — H35033 Hypertensive retinopathy, bilateral: Secondary | ICD-10-CM | POA: Diagnosis not present

## 2015-04-15 DIAGNOSIS — I10 Essential (primary) hypertension: Secondary | ICD-10-CM

## 2015-04-18 ENCOUNTER — Ambulatory Visit (HOSPITAL_COMMUNITY)
Admission: RE | Admit: 2015-04-18 | Discharge: 2015-04-18 | Disposition: A | Discharge status: Home / Self Care | Payer: Medicare Other | Source: Ambulatory Visit | Attending: Family Medicine | Admitting: Family Medicine

## 2015-04-18 DIAGNOSIS — Z1231 Encounter for screening mammogram for malignant neoplasm of breast: Secondary | ICD-10-CM | POA: Diagnosis present

## 2015-04-29 ENCOUNTER — Encounter (INDEPENDENT_AMBULATORY_CARE_PROVIDER_SITE_OTHER): Payer: Medicare Other | Admitting: Ophthalmology

## 2015-05-02 ENCOUNTER — Encounter (INDEPENDENT_AMBULATORY_CARE_PROVIDER_SITE_OTHER): Payer: Medicare Other | Admitting: Ophthalmology

## 2015-05-02 DIAGNOSIS — H35033 Hypertensive retinopathy, bilateral: Secondary | ICD-10-CM

## 2015-05-02 DIAGNOSIS — E11311 Type 2 diabetes mellitus with unspecified diabetic retinopathy with macular edema: Secondary | ICD-10-CM

## 2015-05-02 DIAGNOSIS — H43813 Vitreous degeneration, bilateral: Secondary | ICD-10-CM

## 2015-05-02 DIAGNOSIS — E113513 Type 2 diabetes mellitus with proliferative diabetic retinopathy with macular edema, bilateral: Secondary | ICD-10-CM

## 2015-05-02 DIAGNOSIS — I10 Essential (primary) hypertension: Secondary | ICD-10-CM

## 2015-06-20 ENCOUNTER — Encounter (INDEPENDENT_AMBULATORY_CARE_PROVIDER_SITE_OTHER): Payer: Medicare Other | Admitting: Ophthalmology

## 2015-06-20 DIAGNOSIS — H35033 Hypertensive retinopathy, bilateral: Secondary | ICD-10-CM | POA: Diagnosis not present

## 2015-06-20 DIAGNOSIS — I10 Essential (primary) hypertension: Secondary | ICD-10-CM

## 2015-06-20 DIAGNOSIS — E11311 Type 2 diabetes mellitus with unspecified diabetic retinopathy with macular edema: Secondary | ICD-10-CM

## 2015-06-20 DIAGNOSIS — E113513 Type 2 diabetes mellitus with proliferative diabetic retinopathy with macular edema, bilateral: Secondary | ICD-10-CM

## 2015-06-20 DIAGNOSIS — H43811 Vitreous degeneration, right eye: Secondary | ICD-10-CM

## 2015-07-01 ENCOUNTER — Emergency Department (HOSPITAL_COMMUNITY)
Admission: EM | Admit: 2015-07-01 | Discharge: 2015-07-02 | Disposition: A | Discharge status: Home / Self Care | Payer: Medicare Other | Attending: Emergency Medicine | Admitting: Emergency Medicine

## 2015-07-01 ENCOUNTER — Emergency Department (HOSPITAL_COMMUNITY): Payer: Medicare Other

## 2015-07-01 ENCOUNTER — Encounter (HOSPITAL_COMMUNITY): Payer: Self-pay | Admitting: Emergency Medicine

## 2015-07-01 DIAGNOSIS — Z79899 Other long term (current) drug therapy: Secondary | ICD-10-CM | POA: Insufficient documentation

## 2015-07-01 DIAGNOSIS — I10 Essential (primary) hypertension: Secondary | ICD-10-CM | POA: Diagnosis not present

## 2015-07-01 DIAGNOSIS — E119 Type 2 diabetes mellitus without complications: Secondary | ICD-10-CM | POA: Diagnosis not present

## 2015-07-01 DIAGNOSIS — R42 Dizziness and giddiness: Secondary | ICD-10-CM | POA: Diagnosis present

## 2015-07-01 DIAGNOSIS — Z7982 Long term (current) use of aspirin: Secondary | ICD-10-CM | POA: Diagnosis not present

## 2015-07-01 HISTORY — DX: Anxiety disorder, unspecified: F41.9

## 2015-07-01 HISTORY — DX: Essential (primary) hypertension: I10

## 2015-07-01 HISTORY — DX: Type 2 diabetes mellitus without complications: E11.9

## 2015-07-01 LAB — BASIC METABOLIC PANEL
ANION GAP: 7 (ref 5–15)
BUN: 25 mg/dL — ABNORMAL HIGH (ref 6–20)
CALCIUM: 9.1 mg/dL (ref 8.9–10.3)
CO2: 26 mmol/L (ref 22–32)
Chloride: 104 mmol/L (ref 101–111)
Creatinine, Ser: 1.35 mg/dL — ABNORMAL HIGH (ref 0.44–1.00)
GFR, EST AFRICAN AMERICAN: 42 mL/min — AB (ref >60)
GFR, EST NON AFRICAN AMERICAN: 37 mL/min — AB (ref >60)
Glucose, Bld: 246 mg/dL — ABNORMAL HIGH (ref 65–99)
Potassium: 3.8 mmol/L (ref 3.5–5.1)
SODIUM: 137 mmol/L (ref 135–145)

## 2015-07-01 LAB — CBC WITH DIFFERENTIAL/PLATELET
BASOS ABS: 0 10*3/uL (ref 0.0–0.1)
BASOS PCT: 0 %
EOS ABS: 0.1 10*3/uL (ref 0.0–0.7)
EOS PCT: 3 %
HCT: 36.2 % (ref 36.0–46.0)
Hemoglobin: 11.4 g/dL — ABNORMAL LOW (ref 12.0–15.0)
Lymphocytes Relative: 24 %
Lymphs Abs: 1.3 10*3/uL (ref 0.7–4.0)
MCH: 28.9 pg (ref 26.0–34.0)
MCHC: 31.5 g/dL (ref 30.0–36.0)
MCV: 91.9 fL (ref 78.0–100.0)
MONO ABS: 0.3 10*3/uL (ref 0.1–1.0)
Monocytes Relative: 6 %
NEUTROS ABS: 3.7 10*3/uL (ref 1.7–7.7)
Neutrophils Relative %: 67 %
PLATELETS: 283 10*3/uL (ref 150–400)
RBC: 3.94 MIL/uL (ref 3.87–5.11)
RDW: 14.1 % (ref 11.5–15.5)
WBC: 5.5 10*3/uL (ref 4.0–10.5)

## 2015-07-01 LAB — URINALYSIS, ROUTINE W REFLEX MICROSCOPIC
BILIRUBIN URINE: NEGATIVE
Glucose, UA: NEGATIVE mg/dL
Hgb urine dipstick: NEGATIVE
KETONES UR: NEGATIVE mg/dL
LEUKOCYTES UA: NEGATIVE
NITRITE: NEGATIVE
PH: 6 (ref 5.0–8.0)
Protein, ur: NEGATIVE mg/dL
Specific Gravity, Urine: <1.005 — ABNORMAL LOW (ref 1.005–1.030)

## 2015-07-01 LAB — TROPONIN I: TROPONIN I: 0.03 ng/mL (ref <0.031)

## 2015-07-01 LAB — HEPATIC FUNCTION PANEL
ALBUMIN: 3.9 g/dL (ref 3.5–5.0)
ALK PHOS: 75 U/L (ref 38–126)
ALT: 12 U/L — ABNORMAL LOW (ref 14–54)
AST: 20 U/L (ref 15–41)
BILIRUBIN TOTAL: 0.8 mg/dL (ref 0.3–1.2)
Bilirubin, Direct: 0.1 mg/dL (ref 0.1–0.5)
Indirect Bilirubin: 0.7 mg/dL (ref 0.3–0.9)
TOTAL PROTEIN: 7.4 g/dL (ref 6.5–8.1)

## 2015-07-01 MED ORDER — SODIUM CHLORIDE 0.9 % IV BOLUS (SEPSIS)
500.0000 mL | Freq: Once | INTRAVENOUS | Status: AC
  Administered 2015-07-01: 500 mL via INTRAVENOUS

## 2015-07-01 MED ORDER — IOPAMIDOL (ISOVUE-370) INJECTION 76%
60.0000 mL | Freq: Once | INTRAVENOUS | Status: AC | PRN
  Administered 2015-07-01: 60 mL via INTRAVENOUS

## 2015-07-01 MED ORDER — SODIUM CHLORIDE 0.9 % IV BOLUS (SEPSIS)
1000.0000 mL | Freq: Once | INTRAVENOUS | Status: AC
  Administered 2015-07-01: 1000 mL via INTRAVENOUS

## 2015-07-01 NOTE — ED Provider Notes (Signed)
CSN: [id number]     Arrival date & time 07/01/15  1315 History   First MD Initiated Contact with Patient 07/01/15 1716     Chief Complaint  Patient presents with  . Dizziness     (Consider location/radiation/quality/duration/timing/severity/associated sxs/prior Treatment) Patient is a 80 y.o. female presenting with dizziness. The history is provided by the patient (Patient states that she was having some dizziness last night and then today she's been dizzy. She is not having vertigo symptoms just feeling lightheaded).  Dizziness Quality:  Lightheadedness Severity:  Moderate Onset quality:  Sudden Progression:  Unchanged Chronicity:  New Context: not when bending over   Associated symptoms: no chest pain, no diarrhea and no headaches     Past Medical History  Diagnosis Date  . Hypertension   . Diabetes mellitus without complication ([place])   . Anxiety    Past Surgical History  Procedure Laterality Date  . Back surgery    . Eye surgery     No family history on file. Social History  Substance Use Topics  . Smoking status: Never Smoker   . Smokeless tobacco: None  . Alcohol Use: No   OB History    No data available     Review of Systems  Constitutional: Negative for appetite change and fatigue.  HENT: Negative for congestion, ear discharge and sinus pressure.   Eyes: Negative for discharge.  Respiratory: Negative for cough.   Cardiovascular: Negative for chest pain.  Gastrointestinal: Negative for abdominal pain and diarrhea.  Genitourinary: Negative for frequency and hematuria.  Musculoskeletal: Negative for back pain.  Skin: Negative for rash.  Neurological: Positive for dizziness. Negative for seizures and headaches.  Psychiatric/Behavioral: Negative for hallucinations.      Allergies  Review of patient's allergies indicates no known allergies.  Home Medications   Prior to Admission medications   Medication Sig Start Date End Date Taking? Authorizing  Provider  amLODipine (NORVASC) 10 MG tablet Take 10 mg by mouth daily.   Yes Historical Provider, MD  aspirin EC 81 MG tablet Take 81 mg by mouth daily.   Yes Historical Provider, MD  lisinopril-hydrochlorothiazide (PRINZIDE,ZESTORETIC) 20-25 MG tablet Take 1 tablet by mouth daily.   Yes Historical Provider, MD  metFORMIN (GLUCOPHAGE) 500 MG tablet Take 1,000 mg by mouth 2 (two) times daily with a meal.   Yes Historical Provider, MD   BP 169/78 mmHg  Pulse 78  Temp(Src) 98.7 F (37.1 C) (Oral)  Resp 14  Ht 5\' 6"  (1.676 m)  Wt 215 lb (97.523 kg)  BMI 34.72 kg/m2  SpO2 100% Physical Exam  Constitutional: She is oriented to person, place, and time. She appears well-developed.  HENT:  Head: Normocephalic.  Eyes: Conjunctivae and EOM are normal. No scleral icterus.  Neck: Neck supple. No thyromegaly present.  Cardiovascular: Normal rate and regular rhythm.  Exam reveals no gallop and no friction rub.   No murmur heard. Pulmonary/Chest: No stridor. She has no wheezes. She has no rales. She exhibits no tenderness.  Abdominal: She exhibits no distension. There is no tenderness. There is no rebound.  Musculoskeletal: Normal range of motion. She exhibits no edema.  Lymphadenopathy:    She has no cervical adenopathy.  Neurological: She is oriented to person, place, and time. She exhibits normal muscle tone. Coordination normal.  Skin: No rash noted. No erythema.  Psychiatric: She has a normal mood and affect. Her behavior is normal.    ED Course  Procedures (including critical care time) Labs Review  Labs Reviewed  CBC WITH DIFFERENTIAL/PLATELET - Abnormal; Notable for the following:    Hemoglobin 11.4 (*)    All other components within normal limits  BASIC METABOLIC PANEL - Abnormal; Notable for the following:    Glucose, Bld 246 (*)    BUN 25 (*)    Creatinine, Ser 1.35 (*)    GFR calc non Af Amer 37 (*)    GFR calc Af Amer 42 (*)    All other components within normal limits   URINALYSIS, ROUTINE W REFLEX MICROSCOPIC (NOT AT [facility]) - Abnormal; Notable for the following:    Specific Gravity, Urine <1.005 (*)    All other components within normal limits  HEPATIC FUNCTION PANEL - Abnormal; Notable for the following:    ALT 12 (*)    All other components within normal limits  TROPONIN I    Imaging Review Ct Head Wo Contrast  07/01/2015  CLINICAL DATA:  Dizziness since last night. EXAM: CT HEAD WITHOUT CONTRAST TECHNIQUE: Contiguous axial images were obtained from the base of the skull through the vertex without intravenous contrast. COMPARISON:  None. FINDINGS: There is extensive intracranial atherosclerosis. There is focal calcification projecting centrally in the lumen of the basilar artery (series 2/image 9 and series 4/ image 33). No evidence of parenchymal hemorrhage or extra-axial fluid collection. No mass lesion, mass effect, or midline shift. No CT evidence of acute infarction. Nonspecific moderate subcortical and periventricular white matter hypodensity, most in keeping with chronic small vessel ischemic change. Symmetric globus pallidus calcifications. Cerebral volume is age appropriate. No ventriculomegaly. The visualized paranasal sinuses are essentially clear. The mastoid air cells are unopacified. No evidence of calvarial fracture. IMPRESSION: 1. Extensive intracranial atherosclerosis. Nonspecific focal calcification is seen centrally in the lumen of the basilar artery, which could represent eccentric calcified plaque versus an acute calcified arterial thrombus. Further management, including any decision to pursue additional imaging such as CT angiography, should be based on clinical assessment. 2. No CT evidence of acute infarction. No acute intracranial hemorrhage. Electronically Signed   By: Ilona Sorrel M.D.   On: 07/01/2015 19:25   I have personally reviewed and evaluated these images and lab results as part of my medical decision-making.   EKG  Interpretation   Date/Time:  Monday July 01 2015 17:33:14 EDT Ventricular Rate:  60 PR Interval:    QRS Duration: 134 QT Interval:  438 QTC Calculation: 438 R Axis:   -62 Text Interpretation:  Junctional rhythm Nonspecific IVCD with LAD LVH with  secondary repolarization abnormality Confirmed by Michale Weikel  MD, Ethelda Deangelo  [phone]) on 07/01/2015 11:52:08 PM      MDM   Final diagnoses:  Dizziness   Patient with dizziness. Her symptoms improved with some IV fluids. Labs suggest some dehydration with elevated glucose at about 250. Patient CT scan of her head shows possible arterial thrombus. Patient is to get a CT angiogram of her head and neck    Milton Ferguson, MD 07/02/15 0000

## 2015-07-01 NOTE — ED Notes (Signed)
Pt c/o dizziness since last night, worse today. Pt states she has inner ear problems. Per EMS-elevated bp today. Pt alert and oriented x 4. Denies weakness. Speech clear.

## 2015-07-01 NOTE — Discharge Instructions (Signed)
Drink plenty of fluids and follow up with [name] this week.

## 2015-07-02 NOTE — ED Provider Notes (Signed)
Patient signed out pending CTA. CTA shows widely patent vessels of the head and neck. She does have evidence of atheromatous plaques scattered including one seen on CT scan. However, vessels are patent and no thrombus is noted. She was updated. She continues to feel better. We'll discharge home with primary follow-up.  Results for orders placed or performed during the hospital encounter of 07/01/15  CBC with Differential  Result Value Ref Range   WBC 5.5 4.0 - 10.5 K/uL   RBC 3.94 3.87 - 5.11 MIL/uL   Hemoglobin 11.4 (L) 12.0 - 15.0 g/dL   HCT 36.2 36.0 - 46.0 %   MCV 91.9 78.0 - 100.0 fL   MCH 28.9 26.0 - 34.0 pg   MCHC 31.5 30.0 - 36.0 g/dL   RDW 14.1 11.5 - 15.5 %   Platelets 283 150 - 400 K/uL   Neutrophils Relative % 67 %   Neutro Abs 3.7 1.7 - 7.7 K/uL   Lymphocytes Relative 24 %   Lymphs Abs 1.3 0.7 - 4.0 K/uL   Monocytes Relative 6 %   Monocytes Absolute 0.3 0.1 - 1.0 K/uL   Eosinophils Relative 3 %   Eosinophils Absolute 0.1 0.0 - 0.7 K/uL   Basophils Relative 0 %   Basophils Absolute 0.0 0.0 - 0.1 K/uL  Basic metabolic panel  Result Value Ref Range   Sodium 137 135 - 145 mmol/L   Potassium 3.8 3.5 - 5.1 mmol/L   Chloride 104 101 - 111 mmol/L   CO2 26 22 - 32 mmol/L   Glucose, Bld 246 (H) 65 - 99 mg/dL   BUN 25 (H) 6 - 20 mg/dL   Creatinine, Ser 1.35 (H) 0.44 - 1.00 mg/dL   Calcium 9.1 8.9 - 10.3 mg/dL   GFR calc non Af Amer 37 (L) >60 mL/min   GFR calc Af Amer 42 (L) >60 mL/min   Anion gap 7 5 - 15  Urinalysis, Routine w reflex microscopic (not at [facility])  Result Value Ref Range   Color, Urine YELLOW YELLOW   APPearance CLEAR CLEAR   Specific Gravity, Urine <1.005 (L) 1.005 - 1.030   pH 6.0 5.0 - 8.0   Glucose, UA NEGATIVE NEGATIVE mg/dL   Hgb urine dipstick NEGATIVE NEGATIVE   Bilirubin Urine NEGATIVE NEGATIVE   Ketones, ur NEGATIVE NEGATIVE mg/dL   Protein, ur NEGATIVE NEGATIVE mg/dL   Nitrite NEGATIVE NEGATIVE   Leukocytes, UA NEGATIVE NEGATIVE  Hepatic  function panel  Result Value Ref Range   Total Protein 7.4 6.5 - 8.1 g/dL   Albumin 3.9 3.5 - 5.0 g/dL   AST 20 15 - 41 U/L   ALT 12 (L) 14 - 54 U/L   Alkaline Phosphatase 75 38 - 126 U/L   Total Bilirubin 0.8 0.3 - 1.2 mg/dL   Bilirubin, Direct 0.1 0.1 - 0.5 mg/dL   Indirect Bilirubin 0.7 0.3 - 0.9 mg/dL  Troponin I  Result Value Ref Range   Troponin I 0.03 <0.031 ng/mL   Ct Angio Head W/cm &/or Wo Cm  07/02/2015  CLINICAL DATA:  Initial valuation for acute dizziness. EXAM: CT ANGIOGRAPHY HEAD AND NECK TECHNIQUE: Multidetector CT imaging of the head and neck was performed using the standard protocol during bolus administration of intravenous contrast. Multiplanar CT image reconstructions and MIPs were obtained to evaluate the vascular anatomy. Carotid stenosis measurements (when applicable) are obtained utilizing NASCET criteria, using the distal internal carotid diameter as the denominator. CONTRAST:  60 cc of Isovue 370. COMPARISON:  Prior CT from earlier the same day. FINDINGS: CTA NECK Aortic arch: Visualized aortic arch of normal caliber with normal branch pattern. Scattered calcified plaque within the arch itself. No high-grade stenosis at the origin of the great vessels. Visualized subclavian arteries are widely patent. Right carotid system: Right common carotid artery patent from its origin to the bifurcation. A centric plaque about the right carotid bifurcation without flow-limiting stenosis. Right ICA patent from the bifurcation to the skullbase without stenosis, dissection, or occlusion. Right external carotid artery and its branches within normal limits. Left carotid system: Left common carotid artery patent from its origin to the bifurcation. Mild scattered plaque about the left bifurcation without significant stenosis. Left ICA patent from the bifurcation to the skullbase without stenosis, dissection, or occlusion. Vertebral arteries:Vertebral arteries arise from the subclavian arteries  bilaterally. Pre foraminal V1 segments not well evaluated due to extensive streak artifact. Vertebral arteries otherwise widely patent within the neck without dissection, stenosis, or occlusion. Skeleton: No acute osseous abnormality. Slight reversal of the normal cervical lordosis. No worrisome lytic or blastic osseous lesions. Other neck: Visualized lungs are clear. Visualized superior mediastinum within normal limits. Thyroid normal. No adenopathy within the neck. No acute soft tissue abnormality. CTA HEAD Anterior circulation: The petrous segments are widely patent. Scattered calcified atheromatous plaque within the cavernous/ supraclinoid ICAs with secondary mild to moderate multi focal narrowing (approximately 25-50%). Right ICA is slightly diminutive as compared to the left. Right A1 segment is hypoplastic/ absent, which likely accounts for the diminutive right ICA. Left A1 segment widely patent. Anterior communicating artery and anterior cerebral arteries well opacified. M1 segments patent without stenosis or occlusion. No proximal M2 branch occlusion or stenosis. Distal MCA branches well opacified and symmetric. Posterior circulation: Vertebral arteries are patent to the vertebrobasilar junction. Left vertebral artery is slightly dominant. Posterior inferior cerebral arteries patent bilaterally. Basilar artery is well opacified and widely patent to its distal aspect. Previously noted hyperdensity within the basilar artery favored to reflect a small atheromatous plaque (series 9, image 112). No significant intraluminal stenosis or thrombus identified. Basilar is well opacified distally. Superior cerebral arteries are patent bilaterally. Left SCA arises from the left P1 segment. Both of the posterior cerebral arteries arise from the basilar artery and are well opacified to their distal aspects. Venous sinuses: Patent without evidence for venous sinus thrombosis. Anatomic variants: Hypoplastic/absent right A1  segment with the anterior cerebral arteries supplied via the left carotid artery system. No aneurysm or vascular malformation. Delayed phase: No abnormal enhancement. IMPRESSION: CTA NECK IMPRESSION: 1. No high-grade or critical stenosis identified within the major arterial vasculature of the neck. 2. Mild for age atheromatous plaque about the carotid bifurcations without significant stenosis. 3. Atheromatous plaque within the partially visualized aortic arch. No high-grade stenosis at the origin of the great vessels. 4. Widely patent vertebral arteries within the neck. CTA HEAD IMPRESSION: 1. No large or proximal arterial branch occlusion within the intracranial circulation. No high-grade or flow-limiting stenosis. 2. Focal plaque within the distal basilar artery which accounts for abnormality seen on prior CT. No significant stenosis or intraluminal thrombus identified with this plaque. The vertebrobasilar system is otherwise widely patent. 3. Atheromatous plaque within the cavernous ICAs with secondary mild to moderate multi focal narrowing (25-50%). Electronically Signed   By: Jeannine Boga M.D.   On: 07/02/2015 00:05   Ct Head Wo Contrast  07/01/2015  CLINICAL DATA:  Dizziness since last night. EXAM: CT HEAD WITHOUT CONTRAST TECHNIQUE: Contiguous axial images  were obtained from the base of the skull through the vertex without intravenous contrast. COMPARISON:  None. FINDINGS: There is extensive intracranial atherosclerosis. There is focal calcification projecting centrally in the lumen of the basilar artery (series 2/image 9 and series 4/ image 33). No evidence of parenchymal hemorrhage or extra-axial fluid collection. No mass lesion, mass effect, or midline shift. No CT evidence of acute infarction. Nonspecific moderate subcortical and periventricular white matter hypodensity, most in keeping with chronic small vessel ischemic change. Symmetric globus pallidus calcifications. Cerebral volume is age  appropriate. No ventriculomegaly. The visualized paranasal sinuses are essentially clear. The mastoid air cells are unopacified. No evidence of calvarial fracture. IMPRESSION: 1. Extensive intracranial atherosclerosis. Nonspecific focal calcification is seen centrally in the lumen of the basilar artery, which could represent eccentric calcified plaque versus an acute calcified arterial thrombus. Further management, including any decision to pursue additional imaging such as CT angiography, should be based on clinical assessment. 2. No CT evidence of acute infarction. No acute intracranial hemorrhage. Electronically Signed   By: Ilona Sorrel M.D.   On: 07/01/2015 19:25   Ct Angio Neck W/cm &/or Wo/cm  07/02/2015  CLINICAL DATA:  Initial valuation for acute dizziness. EXAM: CT ANGIOGRAPHY HEAD AND NECK TECHNIQUE: Multidetector CT imaging of the head and neck was performed using the standard protocol during bolus administration of intravenous contrast. Multiplanar CT image reconstructions and MIPs were obtained to evaluate the vascular anatomy. Carotid stenosis measurements (when applicable) are obtained utilizing NASCET criteria, using the distal internal carotid diameter as the denominator. CONTRAST:  60 cc of Isovue 370. COMPARISON:  Prior CT from earlier the same day. FINDINGS: CTA NECK Aortic arch: Visualized aortic arch of normal caliber with normal branch pattern. Scattered calcified plaque within the arch itself. No high-grade stenosis at the origin of the great vessels. Visualized subclavian arteries are widely patent. Right carotid system: Right common carotid artery patent from its origin to the bifurcation. A centric plaque about the right carotid bifurcation without flow-limiting stenosis. Right ICA patent from the bifurcation to the skullbase without stenosis, dissection, or occlusion. Right external carotid artery and its branches within normal limits. Left carotid system: Left common carotid artery  patent from its origin to the bifurcation. Mild scattered plaque about the left bifurcation without significant stenosis. Left ICA patent from the bifurcation to the skullbase without stenosis, dissection, or occlusion. Vertebral arteries:Vertebral arteries arise from the subclavian arteries bilaterally. Pre foraminal V1 segments not well evaluated due to extensive streak artifact. Vertebral arteries otherwise widely patent within the neck without dissection, stenosis, or occlusion. Skeleton: No acute osseous abnormality. Slight reversal of the normal cervical lordosis. No worrisome lytic or blastic osseous lesions. Other neck: Visualized lungs are clear. Visualized superior mediastinum within normal limits. Thyroid normal. No adenopathy within the neck. No acute soft tissue abnormality. CTA HEAD Anterior circulation: The petrous segments are widely patent. Scattered calcified atheromatous plaque within the cavernous/ supraclinoid ICAs with secondary mild to moderate multi focal narrowing (approximately 25-50%). Right ICA is slightly diminutive as compared to the left. Right A1 segment is hypoplastic/ absent, which likely accounts for the diminutive right ICA. Left A1 segment widely patent. Anterior communicating artery and anterior cerebral arteries well opacified. M1 segments patent without stenosis or occlusion. No proximal M2 branch occlusion or stenosis. Distal MCA branches well opacified and symmetric. Posterior circulation: Vertebral arteries are patent to the vertebrobasilar junction. Left vertebral artery is slightly dominant. Posterior inferior cerebral arteries patent bilaterally. Basilar artery is well opacified  and widely patent to its distal aspect. Previously noted hyperdensity within the basilar artery favored to reflect a small atheromatous plaque (series 9, image 112). No significant intraluminal stenosis or thrombus identified. Basilar is well opacified distally. Superior cerebral arteries are  patent bilaterally. Left SCA arises from the left P1 segment. Both of the posterior cerebral arteries arise from the basilar artery and are well opacified to their distal aspects. Venous sinuses: Patent without evidence for venous sinus thrombosis. Anatomic variants: Hypoplastic/absent right A1 segment with the anterior cerebral arteries supplied via the left carotid artery system. No aneurysm or vascular malformation. Delayed phase: No abnormal enhancement. IMPRESSION: CTA NECK IMPRESSION: 1. No high-grade or critical stenosis identified within the major arterial vasculature of the neck. 2. Mild for age atheromatous plaque about the carotid bifurcations without significant stenosis. 3. Atheromatous plaque within the partially visualized aortic arch. No high-grade stenosis at the origin of the great vessels. 4. Widely patent vertebral arteries within the neck. CTA HEAD IMPRESSION: 1. No large or proximal arterial branch occlusion within the intracranial circulation. No high-grade or flow-limiting stenosis. 2. Focal plaque within the distal basilar artery which accounts for abnormality seen on prior CT. No significant stenosis or intraluminal thrombus identified with this plaque. The vertebrobasilar system is otherwise widely patent. 3. Atheromatous plaque within the cavernous ICAs with secondary mild to moderate multi focal narrowing (25-50%). Electronically Signed   By: Jeannine Boga M.D.   On: 07/02/2015 00:05      Merryl Hacker, MD 07/02/15 0020

## 2015-07-04 ENCOUNTER — Encounter (INDEPENDENT_AMBULATORY_CARE_PROVIDER_SITE_OTHER): Payer: Medicare Other | Admitting: Ophthalmology

## 2015-07-04 DIAGNOSIS — H2511 Age-related nuclear cataract, right eye: Secondary | ICD-10-CM

## 2015-07-04 DIAGNOSIS — H35033 Hypertensive retinopathy, bilateral: Secondary | ICD-10-CM

## 2015-07-04 DIAGNOSIS — H43813 Vitreous degeneration, bilateral: Secondary | ICD-10-CM | POA: Diagnosis not present

## 2015-07-04 DIAGNOSIS — E11311 Type 2 diabetes mellitus with unspecified diabetic retinopathy with macular edema: Secondary | ICD-10-CM | POA: Diagnosis not present

## 2015-07-04 DIAGNOSIS — I10 Essential (primary) hypertension: Secondary | ICD-10-CM | POA: Diagnosis not present

## 2015-07-04 DIAGNOSIS — E113513 Type 2 diabetes mellitus with proliferative diabetic retinopathy with macular edema, bilateral: Secondary | ICD-10-CM

## 2015-08-08 ENCOUNTER — Encounter (INDEPENDENT_AMBULATORY_CARE_PROVIDER_SITE_OTHER): Payer: Medicare Other | Admitting: Ophthalmology

## 2015-08-08 DIAGNOSIS — H35033 Hypertensive retinopathy, bilateral: Secondary | ICD-10-CM

## 2015-08-08 DIAGNOSIS — H2511 Age-related nuclear cataract, right eye: Secondary | ICD-10-CM | POA: Diagnosis not present

## 2015-08-08 DIAGNOSIS — I10 Essential (primary) hypertension: Secondary | ICD-10-CM | POA: Diagnosis not present

## 2015-08-08 DIAGNOSIS — H43811 Vitreous degeneration, right eye: Secondary | ICD-10-CM

## 2015-08-08 DIAGNOSIS — E113513 Type 2 diabetes mellitus with proliferative diabetic retinopathy with macular edema, bilateral: Secondary | ICD-10-CM | POA: Diagnosis not present

## 2015-09-26 ENCOUNTER — Encounter (INDEPENDENT_AMBULATORY_CARE_PROVIDER_SITE_OTHER): Payer: Medicare Other | Admitting: Ophthalmology

## 2015-09-26 DIAGNOSIS — E113513 Type 2 diabetes mellitus with proliferative diabetic retinopathy with macular edema, bilateral: Secondary | ICD-10-CM

## 2015-09-26 DIAGNOSIS — E11311 Type 2 diabetes mellitus with unspecified diabetic retinopathy with macular edema: Secondary | ICD-10-CM

## 2015-09-26 DIAGNOSIS — H35033 Hypertensive retinopathy, bilateral: Secondary | ICD-10-CM | POA: Diagnosis not present

## 2015-09-26 DIAGNOSIS — I10 Essential (primary) hypertension: Secondary | ICD-10-CM

## 2015-09-26 DIAGNOSIS — H35372 Puckering of macula, left eye: Secondary | ICD-10-CM | POA: Diagnosis not present

## 2015-09-26 DIAGNOSIS — H43813 Vitreous degeneration, bilateral: Secondary | ICD-10-CM | POA: Diagnosis not present

## 2015-10-03 ENCOUNTER — Encounter (INDEPENDENT_AMBULATORY_CARE_PROVIDER_SITE_OTHER): Payer: Medicare Other | Admitting: Ophthalmology

## 2015-10-03 DIAGNOSIS — H35033 Hypertensive retinopathy, bilateral: Secondary | ICD-10-CM

## 2015-10-03 DIAGNOSIS — H43811 Vitreous degeneration, right eye: Secondary | ICD-10-CM

## 2015-10-03 DIAGNOSIS — E11311 Type 2 diabetes mellitus with unspecified diabetic retinopathy with macular edema: Secondary | ICD-10-CM | POA: Diagnosis not present

## 2015-10-03 DIAGNOSIS — E113513 Type 2 diabetes mellitus with proliferative diabetic retinopathy with macular edema, bilateral: Secondary | ICD-10-CM

## 2015-10-03 DIAGNOSIS — I10 Essential (primary) hypertension: Secondary | ICD-10-CM | POA: Diagnosis not present

## 2015-11-28 ENCOUNTER — Encounter (INDEPENDENT_AMBULATORY_CARE_PROVIDER_SITE_OTHER): Payer: Medicare Other | Admitting: Ophthalmology

## 2015-11-28 DIAGNOSIS — I10 Essential (primary) hypertension: Secondary | ICD-10-CM | POA: Diagnosis not present

## 2015-11-28 DIAGNOSIS — H35033 Hypertensive retinopathy, bilateral: Secondary | ICD-10-CM | POA: Diagnosis not present

## 2015-11-28 DIAGNOSIS — E11311 Type 2 diabetes mellitus with unspecified diabetic retinopathy with macular edema: Secondary | ICD-10-CM | POA: Diagnosis not present

## 2015-11-28 DIAGNOSIS — H43813 Vitreous degeneration, bilateral: Secondary | ICD-10-CM | POA: Diagnosis not present

## 2015-11-28 DIAGNOSIS — E113513 Type 2 diabetes mellitus with proliferative diabetic retinopathy with macular edema, bilateral: Secondary | ICD-10-CM | POA: Diagnosis not present

## 2016-01-02 ENCOUNTER — Encounter (INDEPENDENT_AMBULATORY_CARE_PROVIDER_SITE_OTHER): Payer: Medicare Other | Admitting: Ophthalmology

## 2016-01-02 DIAGNOSIS — E113513 Type 2 diabetes mellitus with proliferative diabetic retinopathy with macular edema, bilateral: Secondary | ICD-10-CM | POA: Diagnosis not present

## 2016-01-02 DIAGNOSIS — I10 Essential (primary) hypertension: Secondary | ICD-10-CM

## 2016-01-02 DIAGNOSIS — E11311 Type 2 diabetes mellitus with unspecified diabetic retinopathy with macular edema: Secondary | ICD-10-CM | POA: Diagnosis not present

## 2016-01-02 DIAGNOSIS — H35033 Hypertensive retinopathy, bilateral: Secondary | ICD-10-CM

## 2016-01-02 DIAGNOSIS — H43813 Vitreous degeneration, bilateral: Secondary | ICD-10-CM

## 2016-01-24 ENCOUNTER — Other Ambulatory Visit (HOSPITAL_COMMUNITY): Payer: Self-pay | Admitting: Nephrology

## 2016-01-24 DIAGNOSIS — I159 Secondary hypertension, unspecified: Secondary | ICD-10-CM

## 2016-01-30 ENCOUNTER — Encounter (INDEPENDENT_AMBULATORY_CARE_PROVIDER_SITE_OTHER): Payer: Medicare Other | Admitting: Ophthalmology

## 2016-02-06 ENCOUNTER — Ambulatory Visit (HOSPITAL_COMMUNITY): Payer: Medicare Other | Attending: Nephrology

## 2016-02-06 ENCOUNTER — Encounter (HOSPITAL_COMMUNITY): Payer: Self-pay

## 2016-02-25 ENCOUNTER — Ambulatory Visit: Payer: Medicare Other | Admitting: Cardiovascular Disease

## 2016-02-26 ENCOUNTER — Encounter: Payer: Self-pay | Admitting: Cardiovascular Disease

## 2016-02-26 ENCOUNTER — Ambulatory Visit (INDEPENDENT_AMBULATORY_CARE_PROVIDER_SITE_OTHER): Payer: Medicare Other | Admitting: Cardiovascular Disease

## 2016-02-26 ENCOUNTER — Other Ambulatory Visit (HOSPITAL_COMMUNITY)
Admission: RE | Admit: 2016-02-26 | Discharge: 2016-02-26 | Disposition: A | Discharge status: Home / Self Care | Payer: Medicare Other | Source: Ambulatory Visit | Attending: Cardiovascular Disease | Admitting: Cardiovascular Disease

## 2016-02-26 VITALS — BP 174/96 | HR 71 | Ht 66.0 in | Wt 217.0 lb

## 2016-02-26 DIAGNOSIS — I4891 Unspecified atrial fibrillation: Secondary | ICD-10-CM

## 2016-02-26 DIAGNOSIS — I10 Essential (primary) hypertension: Secondary | ICD-10-CM

## 2016-02-26 DIAGNOSIS — I498 Other specified cardiac arrhythmias: Secondary | ICD-10-CM | POA: Diagnosis not present

## 2016-02-26 DIAGNOSIS — Z79899 Other long term (current) drug therapy: Secondary | ICD-10-CM | POA: Insufficient documentation

## 2016-02-26 DIAGNOSIS — Z7189 Other specified counseling: Secondary | ICD-10-CM

## 2016-02-26 LAB — BASIC METABOLIC PANEL
ANION GAP: 8 (ref 5–15)
BUN: 21 mg/dL — ABNORMAL HIGH (ref 6–20)
CO2: 27 mmol/L (ref 22–32)
Calcium: 8.5 mg/dL — ABNORMAL LOW (ref 8.9–10.3)
Chloride: 101 mmol/L (ref 101–111)
Creatinine, Ser: 1.22 mg/dL — ABNORMAL HIGH (ref 0.44–1.00)
GFR, EST AFRICAN AMERICAN: 48 mL/min — AB (ref >60)
GFR, EST NON AFRICAN AMERICAN: 41 mL/min — AB (ref >60)
Glucose, Bld: 115 mg/dL — ABNORMAL HIGH (ref 65–99)
POTASSIUM: 3.9 mmol/L (ref 3.5–5.1)
SODIUM: 136 mmol/L (ref 135–145)

## 2016-02-26 LAB — CBC WITH DIFFERENTIAL/PLATELET
BASOS PCT: 0 %
Basophils Absolute: 0 10*3/uL (ref 0.0–0.1)
EOS ABS: 0.1 10*3/uL (ref 0.0–0.7)
Eosinophils Relative: 2 %
HCT: 33.2 % — ABNORMAL LOW (ref 36.0–46.0)
Hemoglobin: 10.9 g/dL — ABNORMAL LOW (ref 12.0–15.0)
Lymphocytes Relative: 23 %
Lymphs Abs: 1.3 10*3/uL (ref 0.7–4.0)
MCH: 30.1 pg (ref 26.0–34.0)
MCHC: 32.8 g/dL (ref 30.0–36.0)
MCV: 91.7 fL (ref 78.0–100.0)
MONOS PCT: 9 %
Monocytes Absolute: 0.5 10*3/uL (ref 0.1–1.0)
NEUTROS PCT: 66 %
Neutro Abs: 3.8 10*3/uL (ref 1.7–7.7)
Platelets: 262 10*3/uL (ref 150–400)
RBC: 3.62 MIL/uL — ABNORMAL LOW (ref 3.87–5.11)
RDW: 14 % (ref 11.5–15.5)
WBC: 5.7 10*3/uL (ref 4.0–10.5)

## 2016-02-26 LAB — PROTIME-INR
INR: 1
PROTHROMBIN TIME: 13.2 s (ref 11.4–15.2)

## 2016-02-26 MED ORDER — CARVEDILOL 3.125 MG PO TABS: 3.1250 mg | ORAL_TABLET | Freq: Two times a day (BID) | ORAL | 3 refills | Status: DC

## 2016-02-26 NOTE — Patient Instructions (Signed)
Medication Instructions:  START COREG 3.125 MG - TWO TIMES DAILY  Labwork: Your physician recommends that you return for lab work in: TODAY CBC BMET INR   Testing/Procedures: Your physician has requested that you have an echocardiogram. Echocardiography is a painless test that uses sound waves to create images of your heart. It provides your doctor with information about the size and shape of your heart and how well your heart's chambers and valves are working. This procedure takes approximately one hour. There are no restrictions for this procedure.    Follow-Up: Your physician recommends that you schedule a follow-up appointment in: [place] A BP CHECK/EKG  Your physician recommends that you schedule a follow-up appointment in: [place] [facility]    Any Other Special Instructions Will Be Listed Below (If Applicable).     If you need a refill on your cardiac medications before your next appointment, please call your pharmacy.

## 2016-02-26 NOTE — Progress Notes (Signed)
CARDIOLOGY CONSULT NOTE  Patient ID: Barbara Stone MRN: [id number] DOB/AGE: 03-Aug-1935 81 y.o.  Admit date: (Not on file) Primary Physician: Robert Bellow, MD Referring Physician:   Reason for Consultation: abnormal ECG  HPI: 81 yr old woman with diabetes and hypertension referred for abnormal ECG.   ECG 06/2015: Probably normal sinus rhythm with low amplitude p waves and nonspecific IVCD (QRS 134 ms) with late R wave transition and possible LVH.  ECG at PCP's office appeared to demonstrate atrial fibrillation.  Was seen for dizziness in ED in 06/2015. Attributed to dehydration and given IV fluids.  Troponin normal. BUN 25, creatinine 1.35, Hgb 11.4.  CT angiography head and neck: No significant carotid stenosis and only focal plaque seen within distal basilar artery. No thrombus.  Denies chest pain, shortness of breath, dizziness, and palpitations. Denies h/o bleeding problems.  BP at PCP's office: 164/82.  Has sensation of "roaring in ears".  Sees nephrology for CKD stage 3 (Dr. Theador Hawthorne), who stopped lisinopril/HCTZ. Now only taking amlodipine 10 mg.  Creat 1.5.  ECG performed in our office today appears to show atrial fibrillation, nonspecific IVCD (QRS 126 ms), late R wave transition, and LAFB, HR 101 bpm.   No Known Allergies  Current Outpatient Prescriptions  Medication Sig Dispense Refill  . amLODipine (NORVASC) 10 MG tablet Take 10 mg by mouth daily.    Marland Kitchen aspirin EC 81 MG tablet Take 81 mg by mouth daily.    . metFORMIN (GLUCOPHAGE) 500 MG tablet Take 1,000 mg by mouth 2 (two) times daily with a meal.     No current facility-administered medications for this visit.     Past Medical History:  Diagnosis Date  . Anxiety   . Diabetes mellitus without complication ([place])   . Hypertension     Past Surgical History:  Procedure Laterality Date  . BACK SURGERY    . EYE SURGERY      Social History   Social History  . Marital status: Widowed      Spouse name: N/A  . Number of children: N/A  . Years of education: N/A   Occupational History  . Not on file.   Social History Main Topics  . Smoking status: Never Smoker  . Smokeless tobacco: Never Used  . Alcohol use No  . Drug use: No  . Sexual activity: Not on file   Other Topics Concern  . Not on file   Social History Narrative  . No narrative on file     No family history of premature CAD in 1st degree relatives.  Prior to Admission medications   Medication Sig Start Date End Date Taking? Authorizing Provider  amLODipine (NORVASC) 10 MG tablet Take 10 mg by mouth daily.    Historical Provider, MD  aspirin EC 81 MG tablet Take 81 mg by mouth daily.    Historical Provider, MD  lisinopril-hydrochlorothiazide (PRINZIDE,ZESTORETIC) 20-25 MG tablet Take 1 tablet by mouth daily.    Historical Provider, MD  metFORMIN (GLUCOPHAGE) 500 MG tablet Take 1,000 mg by mouth 2 (two) times daily with a meal.    Historical Provider, MD     Review of systems complete and found to be negative unless listed above in HPI     Physical exam Blood pressure (!) 174/96, pulse 71, height 5\' 6"  (1.676 m), weight 217 lb (98.4 kg), SpO2 94 %. General: NAD Neck: No JVD, no thyromegaly or thyroid nodule.  Lungs: Clear to auscultation bilaterally  with normal respiratory effort. CV: Nondisplaced PMI. Tachycardic, irregular rhythm, normal S1/S2, no S3, no murmur.  Trace b/l pretibial edema.  No carotid bruit.   Abdomen: Soft, nontender ,obese.  Skin: Intact without lesions or rashes.  Neurologic: Alert and oriented x 3.  Psych: Normal affect. Extremities: No clubbing or cyanosis.  HEENT: Normal.   ECG: Most recent ECG reviewed.  Labs:   Lab Results  Component Value Date   WBC 5.5 07/01/2015   HGB 11.4 (L) 07/01/2015   HCT 36.2 07/01/2015   MCV 91.9 07/01/2015   PLT 283 07/01/2015   No results for input(s): NA, K, CL, CO2, BUN, CREATININE, CALCIUM, PROT, BILITOT, ALKPHOS, ALT, AST,  GLUCOSE in the last 168 hours.  Invalid input(s): LABALBU Lab Results  Component Value Date   TROPONINI 0.03 07/01/2015   No results found for: CHOL No results found for: HDL No results found for: LDLCALC No results found for: TRIG No results found for: CHOLHDL No results found for: LDLDIRECT       Studies: No results found.  ASSESSMENT AND PLAN:  1. Atrial fibrillation: Symptomatically stable. Will start Coreg 3.125 mg bid for HR and BP control. Will initiate warfarin for thromboembolic risk reduction (CHADSVASC score of 5) and enroll in anticoagulation clinic. Will check BMET, CBC, and INR. I will order a 2-D echocardiogram with Doppler to evaluate cardiac structure, function, and regional wall motion. Will have her return in one week for vitals and ECG with nurse.  2. Hypertension: Will start Coreg 3.125 mg bid for HR and BP control. Will have her return in one week for vitals and ECG with nurse.  Dispo: fu with me in 6 weeks. Will have her return in one week for vitals and ECG with nurse.    Signed: Kate Sable, M.D., F.A.C.C.  02/26/2016, 2:25 PM

## 2016-03-02 ENCOUNTER — Encounter (INDEPENDENT_AMBULATORY_CARE_PROVIDER_SITE_OTHER): Payer: Medicare Other | Admitting: Ophthalmology

## 2016-03-04 ENCOUNTER — Ambulatory Visit (HOSPITAL_COMMUNITY)
Admission: RE | Admit: 2016-03-04 | Discharge: 2016-03-04 | Disposition: A | Discharge status: Home / Self Care | Payer: Medicare Other | Source: Ambulatory Visit | Attending: Cardiovascular Disease | Admitting: Cardiovascular Disease

## 2016-03-04 DIAGNOSIS — I4891 Unspecified atrial fibrillation: Secondary | ICD-10-CM | POA: Insufficient documentation

## 2016-03-04 DIAGNOSIS — I498 Other specified cardiac arrhythmias: Secondary | ICD-10-CM | POA: Diagnosis present

## 2016-03-04 NOTE — Progress Notes (Signed)
*  PRELIMINARY RESULTS* Echocardiogram 2D Echocardiogram has been performed.  Leavy Cella 03/04/2016, 3:14 PM

## 2016-03-05 ENCOUNTER — Encounter (INDEPENDENT_AMBULATORY_CARE_PROVIDER_SITE_OTHER): Payer: Medicare Other | Admitting: Ophthalmology

## 2016-03-05 DIAGNOSIS — I10 Essential (primary) hypertension: Secondary | ICD-10-CM

## 2016-03-05 DIAGNOSIS — E113513 Type 2 diabetes mellitus with proliferative diabetic retinopathy with macular edema, bilateral: Secondary | ICD-10-CM | POA: Diagnosis not present

## 2016-03-05 DIAGNOSIS — H35372 Puckering of macula, left eye: Secondary | ICD-10-CM

## 2016-03-05 DIAGNOSIS — H35033 Hypertensive retinopathy, bilateral: Secondary | ICD-10-CM

## 2016-03-05 DIAGNOSIS — H43813 Vitreous degeneration, bilateral: Secondary | ICD-10-CM

## 2016-03-05 DIAGNOSIS — E11311 Type 2 diabetes mellitus with unspecified diabetic retinopathy with macular edema: Secondary | ICD-10-CM

## 2016-03-09 ENCOUNTER — Ambulatory Visit (INDEPENDENT_AMBULATORY_CARE_PROVIDER_SITE_OTHER): Payer: Medicare Other

## 2016-03-09 ENCOUNTER — Ambulatory Visit (INDEPENDENT_AMBULATORY_CARE_PROVIDER_SITE_OTHER): Payer: Medicare Other | Admitting: *Deleted

## 2016-03-09 VITALS — BP 140/80 | HR 99

## 2016-03-09 DIAGNOSIS — I4891 Unspecified atrial fibrillation: Secondary | ICD-10-CM

## 2016-03-09 DIAGNOSIS — Z5181 Encounter for therapeutic drug level monitoring: Secondary | ICD-10-CM | POA: Diagnosis not present

## 2016-03-09 LAB — POCT INR: INR: 1.1

## 2016-03-09 MED ORDER — WARFARIN SODIUM 5 MG PO TABS
5.0000 mg | ORAL_TABLET | Freq: Every day | ORAL | 3 refills | Status: DC
Start: 2016-03-09 — End: 2016-03-16

## 2016-03-09 NOTE — Patient Instructions (Signed)
I will call you today with any changes to your to medication, after I hear from [place]        Thank you for choosing [facility] !

## 2016-03-09 NOTE — Progress Notes (Signed)
Nurse visit after seeing Coumadin clinic, BP 140/80, EKG obtained   Will forward to Dr Bronson Ing for instuctions, I will let patient go home and call her for any changes

## 2016-03-09 NOTE — Progress Notes (Signed)
Increase Coreg to 6.25 mg bid.

## 2016-03-10 ENCOUNTER — Telehealth: Payer: Self-pay

## 2016-03-10 NOTE — Telephone Encounter (Signed)
-----   Message from Laurine Blazer, LPN sent at [phone]/86/7737  5:37 PM EST -----   ----- Message ----- From: Herminio Commons, MD Sent: 03/09/2016   4:35 PM To: Laurine Blazer, LPN  Mildly reduced pumping function. Will aim to control HR.

## 2016-03-10 NOTE — Telephone Encounter (Signed)
Tried several times to reach pt, unable to leave voicemail. Mailed pt a letter with results.

## 2016-03-15 ENCOUNTER — Encounter (HOSPITAL_COMMUNITY): Payer: Self-pay | Admitting: Emergency Medicine

## 2016-03-15 ENCOUNTER — Emergency Department (HOSPITAL_COMMUNITY)
Admission: EM | Admit: 2016-03-15 | Discharge: 2016-03-15 | Disposition: A | Discharge status: Home / Self Care | Payer: Medicare Other | Attending: Emergency Medicine | Admitting: Emergency Medicine

## 2016-03-15 DIAGNOSIS — E119 Type 2 diabetes mellitus without complications: Secondary | ICD-10-CM | POA: Diagnosis not present

## 2016-03-15 DIAGNOSIS — H9312 Tinnitus, left ear: Secondary | ICD-10-CM | POA: Diagnosis present

## 2016-03-15 DIAGNOSIS — Z7982 Long term (current) use of aspirin: Secondary | ICD-10-CM | POA: Diagnosis not present

## 2016-03-15 DIAGNOSIS — I10 Essential (primary) hypertension: Secondary | ICD-10-CM | POA: Insufficient documentation

## 2016-03-15 DIAGNOSIS — Z79899 Other long term (current) drug therapy: Secondary | ICD-10-CM | POA: Insufficient documentation

## 2016-03-15 DIAGNOSIS — Z7984 Long term (current) use of oral hypoglycemic drugs: Secondary | ICD-10-CM | POA: Insufficient documentation

## 2016-03-15 DIAGNOSIS — H6692 Otitis media, unspecified, left ear: Secondary | ICD-10-CM | POA: Insufficient documentation

## 2016-03-15 MED ORDER — AMOXICILLIN 500 MG PO CAPS: 500.0000 mg | ORAL_CAPSULE | Freq: Three times a day (TID) | ORAL | 0 refills | Status: DC

## 2016-03-15 NOTE — ED Notes (Signed)
Pt followed by Dr Karie Kirks, had an recent ear infection to her left ear- treated- pt and daughter thought is was related to her hearing aid usage- hearing aids are ok per family, but pt has experienced roaring in her left ear, particularly when lying down at night- She describes it as a sound akin to cars going past and reports that it awakens her from sleep- she denies excessive asa usage, nor has she ever been evaluated by an ENT per family.

## 2016-03-15 NOTE — ED Triage Notes (Signed)
Pt reports ear infection about 2 months ago, wears bilateral hearing aids normally for age related hearing loss. Pt reports waking up to a roaring sounds twice in past 2 weeks in left ear.No other complaints at this time.

## 2016-03-15 NOTE — Discharge Instructions (Signed)
Return if any problems.  See the ENT for recheck

## 2016-03-15 NOTE — ED Provider Notes (Signed)
[facility] DEPT Provider Note   CSN: [id number] Arrival date & time: 03/15/16  1042  By signing my name below, I, Barbara Stone, attest that this documentation has been prepared under the direction and in the presence of Barbara Hashimoto, PA-C. Electronically Signed: Collene Stone, Scribe. 03/15/16. 11:24 AM.   History   Chief Complaint Chief Complaint  Patient presents with  . Tinnitus   HPI Comments: Barbara Stone is a 81 y.o. female with a hx of DM, anxiety, and HTN, who presents to the Emergency Department complaining of sudden-onset, constant left ear "roaring"  that began a few weeks ago. Patient has bilateral hearing aids due to hearing loss. Patient has no other complaints. Patient reports being treated by [name] for a left-sided ear infection, states her hearing has improved but the noise persists. Patient states the noise is worse when lying in bed. Noise improves when she is standing up, walking around. Patient denies any current pain, fever, or chills.   The history is provided by the patient. No language interpreter was used.    Past Medical History:  Diagnosis Date  . Anxiety   . Diabetes mellitus without complication ([place])   . Hypertension     Patient Active Problem List   Diagnosis Date Noted  . Atrial fibrillation ([place]) [I48.91] 03/09/2016  . Encounter for therapeutic drug monitoring 03/09/2016    Past Surgical History:  Procedure Laterality Date  . BACK SURGERY    . EYE SURGERY      OB History    No data available       Home Medications    Prior to Admission medications   Medication Sig Start Date End Date Taking? Authorizing Provider  amLODipine (NORVASC) 10 MG tablet Take 10 mg by mouth daily.    Historical Provider, MD  amoxicillin (AMOXIL) 500 MG capsule Take 1 capsule (500 mg total) by mouth 3 (three) times daily. 03/15/16   Barbara Meadow, PA-C  aspirin EC 81 MG tablet Take 81 mg by mouth daily.    Historical Provider, MD    carvedilol (COREG) 3.125 MG tablet Take 1 tablet (3.125 mg total) by mouth 2 (two) times daily. 02/26/16 05/26/16  Barbara Commons, MD  furosemide (LASIX) 20 MG tablet Take 20 mg by mouth.    Historical Provider, MD  metFORMIN (GLUCOPHAGE) 500 MG tablet Take 1,000 mg by mouth 2 (two) times daily with a meal.    Historical Provider, MD  warfarin (COUMADIN) 5 MG tablet Take 1 tablet (5 mg total) by mouth daily. 03/09/16   Barbara Commons, MD    Family History Family History  Problem Relation Age of Onset  . Family history unknown: Yes    Social History Social History  Substance Use Topics  . Smoking status: Never Smoker  . Smokeless tobacco: Never Used  . Alcohol use No     Allergies   Patient has no known allergies.   Review of Systems Review of Systems  Constitutional: Negative for chills and fever.  Eyes:       Left ear "roaring'  All other systems reviewed and are negative.    Physical Exam Updated Vital Signs BP 155/80   Pulse 99   Temp 98.6 F (37 C) (Oral)   Resp 18   Ht 5\' 6"  (1.676 m)   Wt 217 lb (98.4 kg)   SpO2 99%   BMI 35.02 kg/m   Physical Exam  Constitutional: She is oriented to person, place, and  time. She appears well-developed.  HENT:  Head: Normocephalic and atraumatic.  Mouth/Throat: Oropharynx is clear and moist.  Left TM is dull.   Eyes: Conjunctivae and EOM are normal. Pupils are equal, round, and reactive to light.  Neck: Normal range of motion. Neck supple.  Cardiovascular: Normal rate.   Pulmonary/Chest: Effort normal.  Musculoskeletal: Normal range of motion.  Neurological: She is alert and oriented to person, place, and time.  Skin: Skin is warm and dry.  Psychiatric: She has a normal mood and affect.     ED Treatments / Results  DIAGNOSTIC STUDIES: Oxygen Saturation is 99% on RA, normal by my interpretation.    COORDINATION OF CARE: 11:20 AM Discussed treatment plan with pt at bedside and pt agreed to plan, which  includes amoxicillin.   Labs (all labs ordered are listed, but only abnormal results are displayed) Labs Reviewed - No data to display  EKG  EKG Interpretation None       Radiology No results found.  Procedures Procedures (including critical care time)  Medications Ordered in ED Medications - No data to display   Initial Impression / Assessment and Plan / ED Course  I have reviewed the triage vital signs and the nursing notes.  Pertinent labs & imaging results that were available during my care of the patient were reviewed by me and considered in my medical decision making (see chart for details).     Given a prescription for amoxicillin. Patient was referred to Dr. Benjamine Stone for evaluation. I suspect symptoms are due to fluid eustachian tube dysfunction.     Final Clinical Impressions(s) / ED Diagnoses   Final diagnoses:  Tinnitus of left ear  Left otitis media, unspecified otitis media type    New Prescriptions New Prescriptions   AMOXICILLIN (AMOXIL) 500 MG CAPSULE    Take 1 capsule (500 mg total) by mouth 3 (three) times daily.   An After Visit Summary was printed and given to the patient.  I personally performed the services in this documentation, which was scribed in my presence.  The recorded information has been reviewed and considered.   Barbara Stone.   Barbara Stone [place], PA-C 03/15/16 [place], MD 03/16/16 [phone]

## 2016-03-16 ENCOUNTER — Ambulatory Visit (INDEPENDENT_AMBULATORY_CARE_PROVIDER_SITE_OTHER): Payer: Medicare Other | Admitting: *Deleted

## 2016-03-16 DIAGNOSIS — Z5181 Encounter for therapeutic drug level monitoring: Secondary | ICD-10-CM

## 2016-03-16 DIAGNOSIS — I4891 Unspecified atrial fibrillation: Secondary | ICD-10-CM | POA: Diagnosis not present

## 2016-03-16 LAB — POCT INR: INR: 1.5

## 2016-03-16 MED ORDER — WARFARIN SODIUM 5 MG PO TABS: ORAL_TABLET | ORAL | 3 refills | Status: DC

## 2016-03-19 ENCOUNTER — Inpatient Hospital Stay (HOSPITAL_COMMUNITY)
Admission: EM | Admit: 2016-03-19 | Discharge: 2016-03-22 | DRG: 291 | Disposition: A | Discharge status: Home / Self Care | Payer: Medicare Other | Attending: Internal Medicine | Admitting: Internal Medicine

## 2016-03-19 ENCOUNTER — Emergency Department (HOSPITAL_COMMUNITY): Payer: Medicare Other

## 2016-03-19 ENCOUNTER — Encounter (HOSPITAL_COMMUNITY): Payer: Self-pay

## 2016-03-19 DIAGNOSIS — I4891 Unspecified atrial fibrillation: Secondary | ICD-10-CM | POA: Diagnosis not present

## 2016-03-19 DIAGNOSIS — I130 Hypertensive heart and chronic kidney disease with heart failure and stage 1 through stage 4 chronic kidney disease, or unspecified chronic kidney disease: Secondary | ICD-10-CM | POA: Diagnosis present

## 2016-03-19 DIAGNOSIS — Z7901 Long term (current) use of anticoagulants: Secondary | ICD-10-CM | POA: Diagnosis not present

## 2016-03-19 DIAGNOSIS — N183 Chronic kidney disease, stage 3 (moderate): Secondary | ICD-10-CM | POA: Diagnosis present

## 2016-03-19 DIAGNOSIS — Z7984 Long term (current) use of oral hypoglycemic drugs: Secondary | ICD-10-CM | POA: Diagnosis not present

## 2016-03-19 DIAGNOSIS — Z79899 Other long term (current) drug therapy: Secondary | ICD-10-CM

## 2016-03-19 DIAGNOSIS — I5021 Acute systolic (congestive) heart failure: Secondary | ICD-10-CM | POA: Diagnosis not present

## 2016-03-19 DIAGNOSIS — I480 Paroxysmal atrial fibrillation: Secondary | ICD-10-CM

## 2016-03-19 DIAGNOSIS — J9601 Acute respiratory failure with hypoxia: Secondary | ICD-10-CM | POA: Diagnosis not present

## 2016-03-19 DIAGNOSIS — I509 Heart failure, unspecified: Secondary | ICD-10-CM

## 2016-03-19 DIAGNOSIS — Z7982 Long term (current) use of aspirin: Secondary | ICD-10-CM

## 2016-03-19 DIAGNOSIS — F419 Anxiety disorder, unspecified: Secondary | ICD-10-CM | POA: Diagnosis present

## 2016-03-19 DIAGNOSIS — I10 Essential (primary) hypertension: Secondary | ICD-10-CM | POA: Diagnosis not present

## 2016-03-19 DIAGNOSIS — I5022 Chronic systolic (congestive) heart failure: Secondary | ICD-10-CM

## 2016-03-19 DIAGNOSIS — R0603 Acute respiratory distress: Secondary | ICD-10-CM

## 2016-03-19 DIAGNOSIS — E1322 Other specified diabetes mellitus with diabetic chronic kidney disease: Secondary | ICD-10-CM | POA: Diagnosis present

## 2016-03-19 DIAGNOSIS — R0600 Dyspnea, unspecified: Secondary | ICD-10-CM | POA: Diagnosis present

## 2016-03-19 DIAGNOSIS — E876 Hypokalemia: Secondary | ICD-10-CM | POA: Diagnosis present

## 2016-03-19 DIAGNOSIS — J441 Chronic obstructive pulmonary disease with (acute) exacerbation: Secondary | ICD-10-CM

## 2016-03-19 DIAGNOSIS — N184 Chronic kidney disease, stage 4 (severe): Secondary | ICD-10-CM

## 2016-03-19 DIAGNOSIS — I429 Cardiomyopathy, unspecified: Secondary | ICD-10-CM | POA: Diagnosis not present

## 2016-03-19 LAB — BLOOD GAS, ARTERIAL
Acid-Base Excess: 2 mmol/L (ref 0.0–2.0)
Bicarbonate: 26.2 mmol/L (ref 20.0–28.0)
DRAWN BY: 234301
Delivery systems: POSITIVE
Expiratory PAP: 6
FIO2: 40
INSPIRATORY PAP: 12
MODE: POSITIVE
O2 Saturation: 97.3 %
Patient temperature: 37
pCO2 arterial: 41 mmHg (ref 32.0–48.0)
pH, Arterial: 7.42 (ref 7.350–7.450)
pO2, Arterial: 104 mmHg (ref 83.0–108.0)

## 2016-03-19 LAB — CBC WITH DIFFERENTIAL/PLATELET
Basophils Absolute: 0 10*3/uL (ref 0.0–0.1)
Basophils Relative: 0 %
Eosinophils Absolute: 0.1 10*3/uL (ref 0.0–0.7)
Eosinophils Relative: 1 %
HCT: 34.1 % — ABNORMAL LOW (ref 36.0–46.0)
Hemoglobin: 10.5 g/dL — ABNORMAL LOW (ref 12.0–15.0)
Lymphocytes Relative: 7 %
Lymphs Abs: 0.9 10*3/uL (ref 0.7–4.0)
MCH: 29.2 pg (ref 26.0–34.0)
MCHC: 30.8 g/dL (ref 30.0–36.0)
MCV: 94.7 fL (ref 78.0–100.0)
Monocytes Absolute: 0.6 10*3/uL (ref 0.1–1.0)
Monocytes Relative: 5 %
Neutro Abs: 10.2 10*3/uL — ABNORMAL HIGH (ref 1.7–7.7)
Neutrophils Relative %: 87 %
Platelets: 353 10*3/uL (ref 150–400)
RBC: 3.6 MIL/uL — ABNORMAL LOW (ref 3.87–5.11)
RDW: 15.1 % (ref 11.5–15.5)
WBC: 11.9 10*3/uL — ABNORMAL HIGH (ref 4.0–10.5)

## 2016-03-19 LAB — COMPREHENSIVE METABOLIC PANEL
ALT: 17 U/L (ref 14–54)
ANION GAP: 9 (ref 5–15)
AST: 26 U/L (ref 15–41)
Albumin: 3.9 g/dL (ref 3.5–5.0)
Alkaline Phosphatase: 76 U/L (ref 38–126)
BUN: 23 mg/dL — AB (ref 6–20)
CHLORIDE: 104 mmol/L (ref 101–111)
CO2: 26 mmol/L (ref 22–32)
Calcium: 8.2 mg/dL — ABNORMAL LOW (ref 8.9–10.3)
Creatinine, Ser: 1.28 mg/dL — ABNORMAL HIGH (ref 0.44–1.00)
GFR, EST AFRICAN AMERICAN: 45 mL/min — AB (ref >60)
GFR, EST NON AFRICAN AMERICAN: 39 mL/min — AB (ref >60)
Glucose, Bld: 232 mg/dL — ABNORMAL HIGH (ref 65–99)
POTASSIUM: 3.4 mmol/L — AB (ref 3.5–5.1)
Sodium: 139 mmol/L (ref 135–145)
TOTAL PROTEIN: 7.5 g/dL (ref 6.5–8.1)
Total Bilirubin: 1.4 mg/dL — ABNORMAL HIGH (ref 0.3–1.2)

## 2016-03-19 LAB — GLUCOSE, CAPILLARY
GLUCOSE-CAPILLARY: 320 mg/dL — AB (ref 65–99)
GLUCOSE-CAPILLARY: 273 mg/dL — AB (ref 65–99)

## 2016-03-19 LAB — BRAIN NATRIURETIC PEPTIDE: B NATRIURETIC PEPTIDE 5: 213 pg/mL — AB (ref 0.0–100.0)

## 2016-03-19 LAB — PROTIME-INR
INR: 1.64
PROTHROMBIN TIME: 19.6 s — AB (ref 11.4–15.2)

## 2016-03-19 LAB — TROPONIN I: Troponin I: <0.03 ng/mL (ref <0.03)

## 2016-03-19 MED ORDER — IPRATROPIUM BROMIDE 0.02 % IN SOLN
0.5000 mg | Freq: Once | RESPIRATORY_TRACT | Status: AC
  Administered 2016-03-19: 0.5 mg via RESPIRATORY_TRACT
  Filled 2016-03-19: qty 2.5

## 2016-03-19 MED ORDER — METHYLPREDNISOLONE SODIUM SUCC 125 MG IJ SOLR
125.0000 mg | Freq: Once | INTRAMUSCULAR | Status: AC
  Administered 2016-03-19: 125 mg via INTRAVENOUS
  Filled 2016-03-19: qty 2

## 2016-03-19 MED ORDER — WARFARIN SODIUM 5 MG PO TABS
7.5000 mg | ORAL_TABLET | Freq: Once | ORAL | Status: AC
  Administered 2016-03-19: 18:00:00 7.5 mg via ORAL
  Filled 2016-03-19: qty 1

## 2016-03-19 MED ORDER — FUROSEMIDE 10 MG/ML IJ SOLN
60.0000 mg | Freq: Once | INTRAMUSCULAR | Status: AC
  Administered 2016-03-19: 60 mg via INTRAVENOUS
  Filled 2016-03-19: qty 6

## 2016-03-19 MED ORDER — SODIUM CHLORIDE 0.9% FLUSH
3.0000 mL | Freq: Two times a day (BID) | INTRAVENOUS | Status: DC
  Administered 2016-03-19 – 2016-03-22 (×6): 3 mL via INTRAVENOUS

## 2016-03-19 MED ORDER — ACETAMINOPHEN 325 MG PO TABS
650.0000 mg | ORAL_TABLET | ORAL | Status: DC | PRN
  Administered 2016-03-21 – 2016-03-22 (×2): 650 mg via ORAL
  Filled 2016-03-19 (×2): qty 2

## 2016-03-19 MED ORDER — WARFARIN - PHARMACIST DOSING INPATIENT
Freq: Every day | Status: DC
  Administered 2016-03-19 – 2016-03-20 (×2)

## 2016-03-19 MED ORDER — SODIUM CHLORIDE 0.9% FLUSH: 3.0000 mL | INTRAVENOUS | Status: DC | PRN

## 2016-03-19 MED ORDER — INSULIN ASPART 100 UNIT/ML ~~LOC~~ SOLN
0.0000 [IU] | Freq: Every day | SUBCUTANEOUS | Status: DC
  Administered 2016-03-19: 3 [IU] via SUBCUTANEOUS

## 2016-03-19 MED ORDER — ASPIRIN EC 81 MG PO TBEC
81.0000 mg | DELAYED_RELEASE_TABLET | Freq: Every day | ORAL | Status: DC
  Administered 2016-03-19 – 2016-03-22 (×4): 81 mg via ORAL
  Filled 2016-03-19 (×4): qty 1

## 2016-03-19 MED ORDER — FUROSEMIDE 10 MG/ML IJ SOLN
40.0000 mg | Freq: Two times a day (BID) | INTRAMUSCULAR | Status: DC
  Administered 2016-03-19 – 2016-03-22 (×6): 40 mg via INTRAVENOUS
  Filled 2016-03-19 (×6): qty 4

## 2016-03-19 MED ORDER — CARVEDILOL 3.125 MG PO TABS
6.2500 mg | ORAL_TABLET | Freq: Two times a day (BID) | ORAL | Status: DC
  Administered 2016-03-19 – 2016-03-20 (×2): 6.25 mg via ORAL
  Filled 2016-03-19 (×2): qty 2

## 2016-03-19 MED ORDER — SODIUM CHLORIDE 0.9 % IV SOLN: 250.0000 mL | INTRAVENOUS | Status: DC | PRN

## 2016-03-19 MED ORDER — AMOXICILLIN 250 MG PO CAPS
500.0000 mg | ORAL_CAPSULE | Freq: Three times a day (TID) | ORAL | Status: AC
  Administered 2016-03-19 – 2016-03-21 (×6): 500 mg via ORAL
  Filled 2016-03-19 (×7): qty 2

## 2016-03-19 MED ORDER — ALBUTEROL SULFATE (2.5 MG/3ML) 0.083% IN NEBU
5.0000 mg | INHALATION_SOLUTION | Freq: Once | RESPIRATORY_TRACT | Status: AC
  Administered 2016-03-19: 5 mg via RESPIRATORY_TRACT
  Filled 2016-03-19: qty 6

## 2016-03-19 MED ORDER — INSULIN ASPART 100 UNIT/ML ~~LOC~~ SOLN
0.0000 [IU] | Freq: Three times a day (TID) | SUBCUTANEOUS | Status: DC
Start: 2016-03-19 — End: 2016-03-22
  Administered 2016-03-19: 7 [IU] via SUBCUTANEOUS
  Administered 2016-03-20: 1 [IU] via SUBCUTANEOUS
  Administered 2016-03-20: 2 [IU] via SUBCUTANEOUS
  Administered 2016-03-20: 1 [IU] via SUBCUTANEOUS
  Administered 2016-03-21 – 2016-03-22 (×4): 2 [IU] via SUBCUTANEOUS

## 2016-03-19 MED ORDER — ONDANSETRON HCL 4 MG/2ML IJ SOLN: 4.0000 mg | Freq: Four times a day (QID) | INTRAMUSCULAR | Status: DC | PRN

## 2016-03-19 NOTE — Progress Notes (Signed)
ANTICOAGULATION CONSULT NOTE - Initial Consult  Pharmacy Consult for coumadin Indication: atrial fibrillation  No Known Allergies  Patient Measurements: Height: 5\' 6"  (167.6 cm) Weight: 217 lb 9.5 oz (98.7 kg) IBW/kg (Calculated) : 59.3   Vital Signs: Temp: 98.6 F (37 C) (02/22 1547) Temp Source: Oral (02/22 1547) BP: 156/77 (02/22 1547) Pulse Rate: 89 (02/22 1547)  Labs:  Recent Labs  03/19/16 0552 03/19/16 0600 03/19/16 1654  HGB 10.5*  --   --   HCT 34.1*  --   --   PLT 353  --   --   LABPROT  --   --  19.6*  INR  --   --  1.64  CREATININE  --  1.28*  --   TROPONINI  --  <0.03  --     Estimated Creatinine Clearance: 42.9 mL/min (by C-G formula based on SCr of 1.28 mg/dL (H)).   Medical History: Past Medical History:  Diagnosis Date  . Anxiety   . Diabetes mellitus without complication ([place])   . Hypertension     Medications:  Prescriptions Prior to Admission  Medication Sig Dispense Refill Last Dose  . amLODipine (NORVASC) 10 MG tablet Take 10 mg by mouth daily.   03/18/2016 at Unknown time  . amoxicillin (AMOXIL) 500 MG capsule Take 1 capsule (500 mg total) by mouth 3 (three) times daily. 30 capsule 0 03/18/2016 at Unknown time  . aspirin EC 81 MG tablet Take 81 mg by mouth daily.   03/18/2016 at Unknown time  . carvedilol (COREG) 3.125 MG tablet Take 1 tablet (3.125 mg total) by mouth 2 (two) times daily. 180 tablet 3 03/18/2016 at 1800  . furosemide (LASIX) 20 MG tablet Take 20 mg by mouth.   03/18/2016 at Unknown time  . metFORMIN (GLUCOPHAGE) 500 MG tablet Take 1,000 mg by mouth 2 (two) times daily with a meal.   03/18/2016 at Unknown time  . warfarin (COUMADIN) 5 MG tablet Take 1 tablet daily except 1 1/2 tablets on Mondays and Thursdays 40 tablet 3 03/18/2016 at 1800    Assessment: 81 yo lady to continue coumadin for afib.  INR on admission is subtherapeutic. Goal of Therapy:  INR 2-3 Monitor platelets by anticoagulation protocol: Yes   Plan:   Coumadin 7.5 mg po today Daily PT/INR Monitor for bleeding complications  Aili Casillas [place] 03/19/2016,5:20 PM

## 2016-03-19 NOTE — ED Notes (Signed)
Pt wearing bi-pap.  No distress.

## 2016-03-19 NOTE — ED Notes (Signed)
Bi pap off .  Pt tolerating well.  No distress. sats 98%

## 2016-03-19 NOTE — H&P (Signed)
History and Physical    CHELLI YERKES HQI:[id number] DOB: [birth year] DOA: 03/19/2016  Referring MD/NP/PA: Rolland Porter, AGP PCP: Robert Bellow, MD  Patient coming from: Home  Chief Complaint: Shortness of breath  HPI: Barbara Stone is a 81 y.o. female with history of hypertension and type 2 diabetes on metformin was recently seen by cardiology due to dizziness and hypertension, echocardiogram was ordered and showed a mildly reduced ejection fraction of 45%. This morning patient woke up and thought she had a chest cold because she felt very short of breath. Shortness of breath progressed very rapidly to the point where they had to call EMS. She was placed on a BiPAP. Upon arrival to the ED was noted to have significant crackles and decreased oxygen sats. After a dose of Lasix with significant urinary output or shortness of breath improved and she was able to be taken off BiPAP. Admission has been requested for further evaluation and management.  Past Medical/Surgical History: Past Medical History:  Diagnosis Date  . Anxiety   . Diabetes mellitus without complication ([place])   . Hypertension     Past Surgical History:  Procedure Laterality Date  . BACK SURGERY    . EYE SURGERY      Social History:  reports that she has never smoked. She has never used smokeless tobacco. She reports that she does not drink alcohol or use drugs.  Allergies: No Known Allergies  Family History:  Family History  Problem Relation Age of Onset  . Family history unknown: Yes    Prior to Admission medications   Medication Sig Start Date End Date Taking? Authorizing Provider  amLODipine (NORVASC) 10 MG tablet Take 10 mg by mouth daily.   Yes Historical Provider, MD  amoxicillin (AMOXIL) 500 MG capsule Take 1 capsule (500 mg total) by mouth 3 (three) times daily. 03/15/16  Yes Fransico Meadow, PA-C  aspirin EC 81 MG tablet Take 81 mg by mouth daily.   Yes Historical Provider, MD  carvedilol (COREG) 3.125  MG tablet Take 1 tablet (3.125 mg total) by mouth 2 (two) times daily. 02/26/16 05/26/16 Yes Herminio Commons, MD  furosemide (LASIX) 20 MG tablet Take 20 mg by mouth.   Yes Historical Provider, MD  metFORMIN (GLUCOPHAGE) 500 MG tablet Take 1,000 mg by mouth 2 (two) times daily with a meal.   Yes Historical Provider, MD  warfarin (COUMADIN) 5 MG tablet Take 1 tablet daily except 1 1/2 tablets on Mondays and Thursdays 03/16/16  Yes Herminio Commons, MD    Review of Systems:  Constitutional: Denies fever, chills, diaphoresis, appetite change and fatigue.  HEENT: Denies photophobia, eye pain, redness, hearing loss, ear pain, congestion, sore throat, rhinorrhea, sneezing, mouth sores, trouble swallowing, neck pain, neck stiffness and tinnitus.   Respiratory: Denies  cough, chest tightness,  and wheezing.   Cardiovascular: Denies chest pain, palpitations and leg swelling.  Gastrointestinal: Denies nausea, vomiting, abdominal pain, diarrhea, constipation, blood in stool and abdominal distention.  Genitourinary: Denies dysuria, urgency, frequency, hematuria, flank pain and difficulty urinating.  Endocrine: Denies: hot or cold intolerance, sweats, changes in hair or nails, polyuria, polydipsia. Musculoskeletal: Denies myalgias, back pain, joint swelling, arthralgias and gait problem.  Skin: Denies pallor, rash and wound.  Neurological: Denies dizziness, seizures, syncope, weakness, light-headedness, numbness and headaches.  Hematological: Denies adenopathy. Easy bruising, personal or family bleeding history  Psychiatric/Behavioral: Denies suicidal ideation, mood changes, confusion, nervousness, sleep disturbance and agitation    Physical Exam: Vitals:  03/19/16 1000 03/19/16 1030 03/19/16 1217 03/19/16 1547  BP: 141/75 (!) 122/104 139/69 (!) 156/77  Pulse: 93 97 87 89  Resp: 21 20 18 18   Temp:   98.7 F (37.1 C) 98.6 F (37 C)  TempSrc:   Oral Oral  SpO2: 98% 97% 97% 99%  Weight:   98.7  kg (217 lb 9.5 oz)   Height:   5\' 6"  (1.676 m)      Constitutional: NAD, calm, comfortable Eyes: PERRL, lids and conjunctivae normal ENMT: Mucous membranes are moist. Posterior pharynx clear of any exudate or lesions.Normal dentition.  Neck: normal, supple, no masses, no thyromegaly Respiratory: clear to auscultation bilaterally, no wheezing, no crackles. Normal respiratory effort. No accessory muscle use.  Cardiovascular: Regular rate and rhythm, no murmurs / rubs / gallops. 2+ lower extremity edema. 2+ pedal pulses. No carotid bruits.  Abdomen: no tenderness, no masses palpated. No hepatosplenomegaly. Bowel sounds positive.  Musculoskeletal: no clubbing / cyanosis. No joint deformity upper and lower extremities. Good ROM, no contractures. Normal muscle tone.  Skin: no rashes, lesions, ulcers. No induration Neurologic: CN 2-12 grossly intact. Sensation intact, DTR normal. Strength 5/5 in all 4.  Psychiatric: Normal judgment and insight. Alert and oriented x 3. Normal mood.    Labs on Admission: I have personally reviewed the following labs and imaging studies  CBC:  Recent Labs Lab 03/19/16 0552  WBC 11.9*  NEUTROABS 10.2*  HGB 10.5*  HCT 34.1*  MCV 94.7  PLT 625   Basic Metabolic Panel:  Recent Labs Lab 03/19/16 0600  NA 139  K 3.4*  CL 104  CO2 26  GLUCOSE 232*  BUN 23*  CREATININE 1.28*  CALCIUM 8.2*   GFR: Estimated Creatinine Clearance: 42.9 mL/min (by C-G formula based on SCr of 1.28 mg/dL (H)). Liver Function Tests:  Recent Labs Lab 03/19/16 0600  AST 26  ALT 17  ALKPHOS 76  BILITOT 1.4*  PROT 7.5  ALBUMIN 3.9   No results for input(s): LIPASE, AMYLASE in the last 168 hours. No results for input(s): AMMONIA in the last 168 hours. Coagulation Profile:  Recent Labs Lab 03/16/16 1120  INR 1.5   Cardiac Enzymes:  Recent Labs Lab 03/19/16 0600  TROPONINI <0.03   BNP (last 3 results) No results for input(s): PROBNP in the last 8760  hours. HbA1C: No results for input(s): HGBA1C in the last 72 hours. CBG: No results for input(s): GLUCAP in the last 168 hours. Lipid Profile: No results for input(s): CHOL, HDL, LDLCALC, TRIG, CHOLHDL, LDLDIRECT in the last 72 hours. Thyroid Function Tests: No results for input(s): TSH, T4TOTAL, FREET4, T3FREE, THYROIDAB in the last 72 hours. Anemia Panel: No results for input(s): VITAMINB12, FOLATE, FERRITIN, TIBC, IRON, RETICCTPCT in the last 72 hours. Urine analysis:    Component Value Date/Time   COLORURINE YELLOW 07/01/2015 1700   APPEARANCEUR CLEAR 07/01/2015 1700   LABSPEC <1.005 (L) 07/01/2015 1700   PHURINE 6.0 07/01/2015 1700   GLUCOSEU NEGATIVE 07/01/2015 1700   HGBUR NEGATIVE 07/01/2015 1700   BILIRUBINUR NEGATIVE 07/01/2015 1700   KETONESUR NEGATIVE 07/01/2015 1700   PROTEINUR NEGATIVE 07/01/2015 1700   UROBILINOGEN 0.2 09/06/2006 1425   NITRITE NEGATIVE 07/01/2015 1700   LEUKOCYTESUR NEGATIVE 07/01/2015 1700   Sepsis Labs: @LABRCNTIP (procalcitonin:4,lacticidven:4) )No results found for this or any previous visit (from the past 240 hour(s)).   Radiological Exams on Admission: Dg Chest Port 1 View  Result Date: 03/19/2016 CLINICAL DATA:  Shortness of breath EXAM: PORTABLE CHEST 1 VIEW COMPARISON:  Chest radiograph 12/29/2005 FINDINGS: There are bibasilar opacities. Cardiomediastinal silhouette is mildly enlarged with atherosclerotic calcification in the aortic arch. No pneumothorax. IMPRESSION: Bibasilar opacities, suspected to be atelectasis. Mild cardiomegaly and aortic atherosclerosis. Electronically Signed   By: Ulyses Jarred M.D.   On: 03/19/2016 06:25    EKG: Independently reviewed. Atrial fibrillation, no acute ischemic abnormalities  Assessment/Plan Active Problems:   Acute systolic CHF (congestive heart failure) ([facility])   Atrial fibrillation ([facility]) [I48.91]   Acute respiratory failure with hypoxemia ([facility])   HTN (hypertension)   CKD (chronic kidney  disease) stage 3, GFR 30-59 ml/min    Acute systolic CHF -Per recent echo her ejection fraction is 45-50% with diffuse hypokinesis, not technically sufficient to allow evaluation of LV diastolic function. -Placed on Lasix 40 mg IV twice a day, strive for negative fluid balance. Daily weights, strict intake and output. -Continue Coreg but will increase dose from 3.125-6.25 twice daily to improve blood pressure and heart rate control. -No ACE inhibitor for now due to chronic kidney dysfunction. -Continue aspirin. Check lipid panel and consider statin initiation pending results. -Will request cardiology evaluation as this is a new finding.  Atrial fibrillation -Weights not optimally controlled, at times increasing to 120 range. -Increase Coreg, continue warfarin for anticoagulation.  Stage III chronic kidney disease -Creatinine is at baseline of 1.1-1.3. -No ACE inhibitor due to this.  Acute hypoxemic respiratory failure -Due to acute systolic CHF. -Continue to wean oxygen as tolerated. Has been able to be weaned off noninvasive positive pressure ventilation.     DVT prophylaxis: Warfarin   Code Status: Full code   Family Communication: Son at bedside updated on plan of care and all questions answered   Disposition Plan: [facility] for discharge home in approximately 48 hours   Consults called: Cardiology   Admission status: Inpatient   Time Spent: 85 minutes   Lelon Frohlich MD [facility] Pager [phone]  If 7PM-7AM, please contact night-coverage [email or web address] Password [facility]  03/19/2016, 4:35 PM

## 2016-03-19 NOTE — ED Triage Notes (Signed)
Pt in by [facility] with resp distress this am.  EMS has pt on cpap and has 1 inch nitro paste to left anterior chest.

## 2016-03-19 NOTE — ED Provider Notes (Signed)
[place] DEPT Provider Note   CSN: [id number] Arrival date & time: 03/19/16  0545  Time seem 05:44 AM  patient seen on arrival   History   Chief Complaint Chief Complaint  Patient presents with  . Respiratory Distress   Level V caveat due to respiratory distress  HPI Barbara Stone is a 81 y.o. female.  HPI  EMS report patient called her son at 4:45 AM for complaints of shortness of breath. On their arrival he states the patient was in tripod position and was diaphoretic. She had diffuse rales in all lung fields. Her blood pressure was 187/128. He states he was not able to get a pulse ox because he put her immediately into their ambulance to transport to the hospital. He did start her on CPap put her on nitroglycerin 1 inch. He states she is looking improved. He states on oxygen on the CPap her pulse ox was 97-99%. She is not on oxygen at home, patient is hard of hearing and left her hearing aids at home  PCP Robert Bellow, MD   Past Medical History:  Diagnosis Date  . Anxiety   . Diabetes mellitus without complication ([place])   . Hypertension     Patient Active Problem List   Diagnosis Date Noted  . Atrial fibrillation ([place]) [I48.91] 03/09/2016  . Encounter for therapeutic drug monitoring 03/09/2016    Past Surgical History:  Procedure Laterality Date  . BACK SURGERY    . EYE SURGERY      OB History    No data available       Home Medications    Prior to Admission medications   Medication Sig Start Date End Date Taking? Authorizing Provider  amLODipine (NORVASC) 10 MG tablet Take 10 mg by mouth daily.    Historical Provider, MD  amoxicillin (AMOXIL) 500 MG capsule Take 1 capsule (500 mg total) by mouth 3 (three) times daily. 03/15/16   Fransico Meadow, PA-C  aspirin EC 81 MG tablet Take 81 mg by mouth daily.    Historical Provider, MD  carvedilol (COREG) 3.125 MG tablet Take 1 tablet (3.125 mg total) by mouth 2 (two) times daily. 02/26/16 05/26/16  Herminio Commons, MD  furosemide (LASIX) 20 MG tablet Take 20 mg by mouth.    Historical Provider, MD  metFORMIN (GLUCOPHAGE) 500 MG tablet Take 1,000 mg by mouth 2 (two) times daily with a meal.    Historical Provider, MD  warfarin (COUMADIN) 5 MG tablet Take 1 tablet daily except 1 1/2 tablets on Mondays and Thursdays 03/16/16   Herminio Commons, MD    Family History Family History  Problem Relation Age of Onset  . Family history unknown: Yes    Social History Social History  Substance Use Topics  . Smoking status: Never Smoker  . Smokeless tobacco: Never Used  . Alcohol use No  lives at home   Allergies   Patient has no known allergies.   Review of Systems Review of Systems  Unable to perform ROS: Severe respiratory distress     Physical Exam Updated Vital Signs BP 143/82   Pulse 94   Resp 24   SpO2 100%   Vital signs normal    Physical Exam  Constitutional: She is oriented to person, place, and time. She appears well-developed and well-nourished.  Non-toxic appearance. She does not appear ill. She appears distressed.  HENT:  Head: Normocephalic and atraumatic.  Right Ear: External ear normal.  Left Ear:  External ear normal.  Nose: Nose normal. No mucosal edema or rhinorrhea.  Mouth/Throat: Oropharynx is clear and moist and mucous membranes are normal. No dental abscesses or uvula swelling.  Eyes: Conjunctivae and EOM are normal. Pupils are equal, round, and reactive to light.  Neck: Normal range of motion and full passive range of motion without pain. Neck supple.  Cardiovascular: Normal rate, regular rhythm and normal heart sounds.  Exam reveals no gallop and no friction rub.   No murmur heard. Pulmonary/Chest: Accessory muscle usage present. Tachypnea noted. She is in respiratory distress. She has decreased breath sounds. She has wheezes. She has no rhonchi. She has no rales. She exhibits no tenderness and no crepitus.  Pt has end expir wheezing  Abdominal:  Soft. Normal appearance and bowel sounds are normal. She exhibits no distension. There is no tenderness. There is no rebound and no guarding.  Musculoskeletal: Normal range of motion. She exhibits edema. She exhibits no tenderness.  Moves all extremities well.  Patient has 1+ pain edema on both lower extremities  Neurological: She is alert and oriented to person, place, and time. She has normal strength. No cranial nerve deficit.  Skin: Skin is warm, dry and intact. No rash noted. No erythema. No pallor.  Psychiatric: She has a normal mood and affect. Her speech is normal and behavior is normal. Her mood appears not anxious.  Nursing note and vitals reviewed.    ED Treatments / Results  Labs (all labs ordered are listed, but only abnormal results are displayed) Results for orders placed or performed during the hospital encounter of 03/19/16  Comprehensive metabolic panel  Result Value Ref Range   Sodium 139 135 - 145 mmol/L   Potassium 3.4 (L) 3.5 - 5.1 mmol/L   Chloride 104 101 - 111 mmol/L   CO2 26 22 - 32 mmol/L   Glucose, Bld 232 (H) 65 - 99 mg/dL   BUN 23 (H) 6 - 20 mg/dL   Creatinine, Ser 1.28 (H) 0.44 - 1.00 mg/dL   Calcium 8.2 (L) 8.9 - 10.3 mg/dL   Total Protein 7.5 6.5 - 8.1 g/dL   Albumin 3.9 3.5 - 5.0 g/dL   AST 26 15 - 41 U/L   ALT 17 14 - 54 U/L   Alkaline Phosphatase 76 38 - 126 U/L   Total Bilirubin 1.4 (H) 0.3 - 1.2 mg/dL   GFR calc non Af Amer 39 (L) >60 mL/min   GFR calc Af Amer 45 (L) >60 mL/min   Anion gap 9 5 - 15  Brain natriuretic peptide  Result Value Ref Range   B Natriuretic Peptide 213.0 (H) 0.0 - 100.0 pg/mL  CBC with Differential  Result Value Ref Range   WBC 11.9 (H) 4.0 - 10.5 K/uL   RBC 3.60 (L) 3.87 - 5.11 MIL/uL   Hemoglobin 10.5 (L) 12.0 - 15.0 g/dL   HCT 34.1 (L) 36.0 - 46.0 %   MCV 94.7 78.0 - 100.0 fL   MCH 29.2 26.0 - 34.0 pg   MCHC 30.8 30.0 - 36.0 g/dL   RDW 15.1 11.5 - 15.5 %   Platelets 353 150 - 400 K/uL   Neutrophils  Relative % 87 %   Neutro Abs 10.2 (H) 1.7 - 7.7 K/uL   Lymphocytes Relative 7 %   Lymphs Abs 0.9 0.7 - 4.0 K/uL   Monocytes Relative 5 %   Monocytes Absolute 0.6 0.1 - 1.0 K/uL   Eosinophils Relative 1 %   Eosinophils Absolute 0.1  0.0 - 0.7 K/uL   Basophils Relative 0 %   Basophils Absolute 0.0 0.0 - 0.1 K/uL  Troponin I  Result Value Ref Range   Troponin I <0.03 <0.03 ng/mL  Blood gas, arterial  Result Value Ref Range   FIO2 40.00    Delivery systems BILEVEL POSITIVE AIRWAY PRESSURE    Mode BILEVEL POSITIVE AIRWAY PRESSURE    Inspiratory PAP 12    Expiratory PAP 6    pH, Arterial 7.420 7.350 - 7.450   pCO2 arterial 41.0 32.0 - 48.0 mmHg   pO2, Arterial 104 83.0 - 108.0 mmHg   Bicarbonate 26.2 20.0 - 28.0 mmol/L   Acid-Base Excess 2.0 0.0 - 2.0 mmol/L   O2 Saturation 97.3 %   Patient temperature 37.0    Collection site LEFT BRACHIAL    Drawn by [id number]    Sample type ARTERIAL DRAW    Laboratory interpretation all normal except Stable anemia, renal insufficiency, hyperglycemia, minimal elevation of BNP     EKG  EKG Interpretation  Date/Time:  Thursday March 19 2016 05:55:24 EST Ventricular Rate:  97 PR Interval:    QRS Duration: 136 QT Interval:  400 QTC Calculation: 509 R Axis:   -61 Text Interpretation:  Undetermined rhythm Left bundle branch block Electrode noise Confirmed by Cloey Sferrazza  MD-I, Norberta Stobaugh ([id number]) on 03/19/2016 6:24:49 AM       Radiology Dg Chest Port 1 View  Result Date: 03/19/2016 CLINICAL DATA:  Shortness of breath EXAM: PORTABLE CHEST 1 VIEW COMPARISON:  Chest radiograph 12/29/2005 FINDINGS: There are bibasilar opacities. Cardiomediastinal silhouette is mildly enlarged with atherosclerotic calcification in the aortic arch. No pneumothorax. IMPRESSION: Bibasilar opacities, suspected to be atelectasis. Mild cardiomegaly and aortic atherosclerosis. Electronically Signed   By: Ulyses Jarred M.D.   On: 03/19/2016 06:25    Procedures Procedures (including  critical care time)  CRITICAL CARE Performed by: Ifrah Vest L Darnesha Diloreto Total critical care time: 39 minutes Critical care time was exclusive of separately billable procedures and treating other patients. Critical care was necessary to treat or prevent imminent or life-threatening deterioration. Critical care was time spent personally by me on the following activities: development of treatment plan with patient and/or surrogate as well as nursing, discussions with consultants, evaluation of patient's response to treatment, examination of patient, obtaining history from patient or surrogate, ordering and performing treatments and interventions, ordering and review of laboratory studies, ordering and review of radiographic studies, pulse oximetry and re-evaluation of patient's condition.   Medications Ordered in ED Medications  albuterol (PROVENTIL) (2.5 MG/3ML) 0.083% nebulizer solution 5 mg (5 mg Nebulization Given 03/19/16 0557)  ipratropium (ATROVENT) nebulizer solution 0.5 mg (0.5 mg Nebulization Given 03/19/16 0557)  furosemide (LASIX) injection 60 mg (60 mg Intravenous Given 03/19/16 0558)  methylPREDNISolone sodium succinate (SOLU-MEDROL) 125 mg/2 mL injection 125 mg (125 mg Intravenous Given 03/19/16 0716)     Initial Impression / Assessment and Plan / ED Course  I have reviewed the triage vital signs and the nursing notes.  Pertinent labs & imaging results that were available during my care of the patient were reviewed by me and considered in my medical decision making (see chart for details).  Patient was taken off EMS's CPAP and started on Bipap on arrival.   Patient was started on albuterol and Atrovent nebulizer. She was given Lasix IV.  After reviewing her chest x-ray her BMP she was given Solu-Medrol IV for possible COPD exacerbation.  Recheck at 7:15 AM patient states she's feeling much better.  When I listen to her lungs now seen clear. Discussed her test results. ABG was  ordered.  07:44 AM Dr Jerilee Hoh, we discussed she can probably come off the Bipap now, will take her off Bipap and if she does well, she can be admitted to tele, if not will need to stay on bipap and be admitted to ICU.   08:20 AM pt is doing well off the bipap.    Review of Dr. Kristian Covey note from January 31--shows she was in atrial fibrillation during her office visit. He adjusted her coreg and ordered an echo.   Echocardiogram 03/04/2016 Impressions:  - Mild LVH with LVEF 45-50% and indeterminate diastolic function in   the setting of atrial fibrillation. Moderate to severe left   atrial enlargement. Mild mitral regurgitation. Sclerotic aortic   valve with mild aortic regurgitation. Mildly dilated ascending   aorta. Mild tricuspid regurgitation with PASP 44 mmHg.  Final Clinical Impressions(s) / ED Diagnoses   Final diagnoses:  Respiratory distress  Acute congestive heart failure, unspecified congestive heart failure type ([place])  COPD exacerbation ([place])    Plan admission  Rolland Porter, MD, Barbette Or, MD 03/19/16 [phone]

## 2016-03-20 DIAGNOSIS — N183 Chronic kidney disease, stage 3 (moderate): Secondary | ICD-10-CM

## 2016-03-20 DIAGNOSIS — I429 Cardiomyopathy, unspecified: Secondary | ICD-10-CM

## 2016-03-20 DIAGNOSIS — I10 Essential (primary) hypertension: Secondary | ICD-10-CM

## 2016-03-20 DIAGNOSIS — I5021 Acute systolic (congestive) heart failure: Secondary | ICD-10-CM

## 2016-03-20 DIAGNOSIS — J9601 Acute respiratory failure with hypoxia: Secondary | ICD-10-CM

## 2016-03-20 DIAGNOSIS — I4891 Unspecified atrial fibrillation: Secondary | ICD-10-CM

## 2016-03-20 LAB — GLUCOSE, CAPILLARY
GLUCOSE-CAPILLARY: 176 mg/dL — AB (ref 65–99)
GLUCOSE-CAPILLARY: 134 mg/dL — AB (ref 65–99)
GLUCOSE-CAPILLARY: 158 mg/dL — AB (ref 65–99)
Glucose-Capillary: 139 mg/dL — ABNORMAL HIGH (ref 65–99)

## 2016-03-20 LAB — BASIC METABOLIC PANEL
Anion gap: 12 (ref 5–15)
BUN: 25 mg/dL — AB (ref 6–20)
CALCIUM: 8.5 mg/dL — AB (ref 8.9–10.3)
CHLORIDE: 102 mmol/L (ref 101–111)
CO2: 28 mmol/L (ref 22–32)
CREATININE: 1.27 mg/dL — AB (ref 0.44–1.00)
GFR calc non Af Amer: 39 mL/min — ABNORMAL LOW (ref >60)
GFR, EST AFRICAN AMERICAN: 46 mL/min — AB (ref >60)
Glucose, Bld: 179 mg/dL — ABNORMAL HIGH (ref 65–99)
Potassium: 3.2 mmol/L — ABNORMAL LOW (ref 3.5–5.1)
Sodium: 142 mmol/L (ref 135–145)

## 2016-03-20 LAB — PROTIME-INR
INR: 1.89
Prothrombin Time: 22 seconds — ABNORMAL HIGH (ref 11.4–15.2)

## 2016-03-20 LAB — HEMOGLOBIN A1C
HEMOGLOBIN A1C: 6.3 % — AB (ref 4.8–5.6)
Mean Plasma Glucose: 134

## 2016-03-20 LAB — MAGNESIUM: MAGNESIUM: 1.5 mg/dL — AB (ref 1.7–2.4)

## 2016-03-20 MED ORDER — POTASSIUM CHLORIDE CRYS ER 20 MEQ PO TBCR
40.0000 meq | EXTENDED_RELEASE_TABLET | Freq: Once | ORAL | Status: AC
  Administered 2016-03-20: 40 meq via ORAL
  Filled 2016-03-20: qty 2

## 2016-03-20 MED ORDER — POTASSIUM CHLORIDE CRYS ER 20 MEQ PO TBCR
40.0000 meq | EXTENDED_RELEASE_TABLET | Freq: Once | ORAL | Status: AC
  Administered 2016-03-20: 40 meq via ORAL

## 2016-03-20 MED ORDER — CARVEDILOL 3.125 MG PO TABS
6.2500 mg | ORAL_TABLET | Freq: Once | ORAL | Status: AC
  Administered 2016-03-20: 6.25 mg via ORAL
  Filled 2016-03-20: qty 2

## 2016-03-20 MED ORDER — CARVEDILOL 12.5 MG PO TABS
12.5000 mg | ORAL_TABLET | Freq: Two times a day (BID) | ORAL | Status: DC
  Administered 2016-03-20 – 2016-03-22 (×4): 12.5 mg via ORAL
  Filled 2016-03-20 (×4): qty 1

## 2016-03-20 MED ORDER — POTASSIUM CHLORIDE CRYS ER 20 MEQ PO TBCR
20.0000 meq | EXTENDED_RELEASE_TABLET | Freq: Every day | ORAL | Status: DC
  Administered 2016-03-21 – 2016-03-22 (×2): 20 meq via ORAL
  Filled 2016-03-20 (×4): qty 1

## 2016-03-20 MED ORDER — WARFARIN SODIUM 5 MG PO TABS
5.0000 mg | ORAL_TABLET | Freq: Once | ORAL | Status: AC
  Administered 2016-03-20: 5 mg via ORAL
  Filled 2016-03-20: qty 1

## 2016-03-20 NOTE — Progress Notes (Signed)
ANTICOAGULATION CONSULT NOTE - follow up  Pharmacy Consult for coumadin Indication: atrial fibrillation  No Known Allergies  Patient Measurements: Height: 5\' 6"  (167.6 cm) Weight: 214 lb 11.7 oz (97.4 kg) IBW/kg (Calculated) : 59.3   Vital Signs: Temp: 98.9 F (37.2 C) (02/23 0500) Temp Source: Oral (02/23 0500) BP: 164/84 (02/23 0500) Pulse Rate: 84 (02/23 0500)  Labs:  Recent Labs  03/19/16 0552 03/19/16 0600 03/19/16 1654 03/20/16 0409  HGB 10.5*  --   --   --   HCT 34.1*  --   --   --   PLT 353  --   --   --   LABPROT  --   --  19.6* 22.0*  INR  --   --  1.64 1.89  CREATININE  --  1.28*  --  1.27*  TROPONINI  --  <0.03  --   --     Estimated Creatinine Clearance: 42.9 mL/min (by C-G formula based on SCr of 1.27 mg/dL (H)).  Medical History: Past Medical History:  Diagnosis Date  . Anxiety   . Diabetes mellitus without complication ([place])   . Hypertension    Medications:  Prescriptions Prior to Admission  Medication Sig Dispense Refill Last Dose  . amLODipine (NORVASC) 10 MG tablet Take 10 mg by mouth daily.   03/18/2016 at Unknown time  . amoxicillin (AMOXIL) 500 MG capsule Take 1 capsule (500 mg total) by mouth 3 (three) times daily. 30 capsule 0 03/18/2016 at Unknown time  . aspirin EC 81 MG tablet Take 81 mg by mouth daily.   03/18/2016 at Unknown time  . carvedilol (COREG) 3.125 MG tablet Take 1 tablet (3.125 mg total) by mouth 2 (two) times daily. 180 tablet 3 03/18/2016 at 1800  . furosemide (LASIX) 20 MG tablet Take 20 mg by mouth.   03/18/2016 at Unknown time  . metFORMIN (GLUCOPHAGE) 500 MG tablet Take 1,000 mg by mouth 2 (two) times daily with a meal.   03/18/2016 at Unknown time  . warfarin (COUMADIN) 5 MG tablet Take 1 tablet daily except 1 1/2 tablets on Mondays and Thursdays 40 tablet 3 03/18/2016 at 1800   Assessment: 81 yo lady to continue coumadin for afib.  INR on admission is subtherapeutic.  INR trending up toward goal.   Goal of Therapy:   INR 2-3 Monitor platelets by anticoagulation protocol: Yes   Plan:  Coumadin 5 mg po today Daily PT/INR Monitor for bleeding complications  Hart Robinsons A 03/20/2016,8:19 AM

## 2016-03-20 NOTE — Care Management Note (Signed)
Case Management Note  Patient Details  Name: Barbara Stone MRN: [id number] Date of Birth: [birth year]  Subjective/Objective:                  Pt is from home, lives with her SIL. She is ind with ADL's. She has PCP, [name], and drives herself to appointment. She has insurance with drug coverage. She has no HH or DME PTA. She plans to return home with self care. Family at bedside for DC planning discussion.   Action/Plan: No CM needs anticipated.   Expected Discharge Date:     03/21/2016             Expected Discharge Plan:  Home/Self Care  In-House Referral:  NA  Discharge planning Services  CM Consult  Post Acute Care Choice:  NA Choice offered to:  NA  Status of Service:  Completed, signed off  Sherald Barge, RN 03/20/2016, 9:28 AM

## 2016-03-20 NOTE — Care Management Important Message (Signed)
Important Message  Patient Details  Name: Barbara Stone MRN: [id number] Date of Birth: 1935-07-14   Medicare Important Message Given:  Yes    Sherald Barge, RN 03/20/2016, 9:30 AM

## 2016-03-20 NOTE — Progress Notes (Signed)
PROGRESS NOTE    Barbara Stone  IZT:[id number] DOB: 01-22-1936 DOA: 03/19/2016 PCP: Robert Bellow, MD     Brief Narrative:  81 year old woman admitted to the hospital from home on 2/22 due to shortness of breath, found to have acute systolic heart failure which is new onset.   Assessment & Plan:   Active Problems:   Acute systolic CHF (congestive heart failure) ([facility])   Atrial fibrillation ([facility]) [I48.91]   Acute respiratory failure with hypoxemia ([facility])   HTN (hypertension)   CKD (chronic kidney disease) stage 3, GFR 30-59 ml/min   Acute systolic CHF -Echo with ejection fraction of 45-50% with diffuse hypokinesis. Suspect may be tachycardia mediated due to her uncontrolled A. Fib. -She is 1.4 L negative since admission, volume status is improved but is still hypervolemic. -Plan to continue Lasix 40 mg IV twice a day. -Not on ACE inhibitor due to chronic kidney disease.  Atrial fibrillation -Rates are improved in the 80s to 90s. -Coreg has been increased to 12.5 mg twice a day for more optimal heart rate and blood pressure control. -She is on Coumadin for anticoagulation purposes.  Acute hypoxemic respiratory failure -Due to acute systolic CHF, improved, does not have any current oxygen requirements.  Chronic kidney disease stage III  -creatinine remains at baseline of between 1.1 and 1.3, would anticipate some worsening with diuresis. -No ACE inhibitor due to this.  Hypokalemia -Due to diuresis, replete orally and check magnesium.  DVT prophylaxis: Warfarin  Code Status: Full code  Family Communication: Multiple family members at bedside updated on plan of care and questions answered  Disposition Plan: Anticipate discharge home in 48-72 hours  Consultants:  Cardiology  Procedures:  None  Antimicrobials:  Anti-infectives    Start     Dose/Rate Route Frequency Ordered Stop   03/19/16 1645  amoxicillin (AMOXIL) capsule 500 mg     500 mg Oral 3 times daily  03/19/16 1635 03/21/16 1559       Subjective: Feels much improved, anxious for discharge home.   Objective: Vitals:   03/20/16 0500 03/20/16 1007 03/20/16 1316 03/20/16 1447  BP: (!) 164/84 (!) 148/72 (!) 148/74 (!) 144/71  Pulse: 84 82 87 86  Resp: 18   18  Temp: 98.9 F (37.2 C)   98.5 F (36.9 C)  TempSrc: Oral   Oral  SpO2: 97%   98%  Weight: 97.4 kg (214 lb 11.7 oz)     Height:        Intake/Output Summary (Last 24 hours) at 03/20/16 1645 Last data filed at 03/20/16 1300  Gross per 24 hour  Intake              723 ml  Output             1000 ml  Net             -277 ml   Filed Weights   03/19/16 1217 03/19/16 1623 03/20/16 0500  Weight: 98.7 kg (217 lb 9.5 oz) 98.7 kg (217 lb 9.5 oz) 97.4 kg (214 lb 11.7 oz)    Examination:  General exam: Alert, awake, oriented x 3 Respiratory system: Clear to auscultation. Respiratory effort normal. Cardiovascular system:RRR. No murmurs, rubs, gallops. Gastrointestinal system: Abdomen is nondistended, soft and nontender. No organomegaly or masses felt. Normal bowel sounds heard. Central nervous system: Alert and oriented. No focal neurological deficits. Extremities:2+ pitting edema bilaterally  Skin: No rashes, lesions or ulcers Psychiatry: Judgement and insight appear normal. Mood &  affect appropriate.     Data Reviewed: I have personally reviewed following labs and imaging studies  CBC:  Recent Labs Lab 03/19/16 0552  WBC 11.9*  NEUTROABS 10.2*  HGB 10.5*  HCT 34.1*  MCV 94.7  PLT 629   Basic Metabolic Panel:  Recent Labs Lab 03/19/16 0600 03/20/16 0409  NA 139 142  K 3.4* 3.2*  CL 104 102  CO2 26 28  GLUCOSE 232* 179*  BUN 23* 25*  CREATININE 1.28* 1.27*  CALCIUM 8.2* 8.5*   GFR: Estimated Creatinine Clearance: 42.9 mL/min (by C-G formula based on SCr of 1.27 mg/dL (H)). Liver Function Tests:  Recent Labs Lab 03/19/16 0600  AST 26  ALT 17  ALKPHOS 76  BILITOT 1.4*  PROT 7.5  ALBUMIN 3.9    No results for input(s): LIPASE, AMYLASE in the last 168 hours. No results for input(s): AMMONIA in the last 168 hours. Coagulation Profile:  Recent Labs Lab 03/16/16 1120 03/19/16 1654 03/20/16 0409  INR 1.5 1.64 1.89   Cardiac Enzymes:  Recent Labs Lab 03/19/16 0600  TROPONINI <0.03   BNP (last 3 results) No results for input(s): PROBNP in the last 8760 hours. HbA1C:  Recent Labs  03/19/16 0600  HGBA1C 6.3*   CBG:  Recent Labs Lab 03/19/16 1658 03/19/16 2135 03/20/16 0725 03/20/16 1136 03/20/16 1627  GLUCAP 320* 273* 176* 134* 139*   Lipid Profile: No results for input(s): CHOL, HDL, LDLCALC, TRIG, CHOLHDL, LDLDIRECT in the last 72 hours. Thyroid Function Tests: No results for input(s): TSH, T4TOTAL, FREET4, T3FREE, THYROIDAB in the last 72 hours. Anemia Panel: No results for input(s): VITAMINB12, FOLATE, FERRITIN, TIBC, IRON, RETICCTPCT in the last 72 hours. Urine analysis:    Component Value Date/Time   COLORURINE YELLOW 07/01/2015 1700   APPEARANCEUR CLEAR 07/01/2015 1700   LABSPEC <1.005 (L) 07/01/2015 1700   PHURINE 6.0 07/01/2015 1700   GLUCOSEU NEGATIVE 07/01/2015 1700   HGBUR NEGATIVE 07/01/2015 1700   BILIRUBINUR NEGATIVE 07/01/2015 1700   KETONESUR NEGATIVE 07/01/2015 1700   PROTEINUR NEGATIVE 07/01/2015 1700   UROBILINOGEN 0.2 09/06/2006 1425   NITRITE NEGATIVE 07/01/2015 1700   LEUKOCYTESUR NEGATIVE 07/01/2015 1700   Sepsis Labs: @LABRCNTIP (procalcitonin:4,lacticidven:4)  )No results found for this or any previous visit (from the past 240 hour(s)).       Radiology Studies: Dg Chest Port 1 View  Result Date: 03/19/2016 CLINICAL DATA:  Shortness of breath EXAM: PORTABLE CHEST 1 VIEW COMPARISON:  Chest radiograph 12/29/2005 FINDINGS: There are bibasilar opacities. Cardiomediastinal silhouette is mildly enlarged with atherosclerotic calcification in the aortic arch. No pneumothorax. IMPRESSION: Bibasilar opacities, suspected to be  atelectasis. Mild cardiomegaly and aortic atherosclerosis. Electronically Signed   By: Ulyses Jarred M.D.   On: 03/19/2016 06:25        Scheduled Meds: . amoxicillin  500 mg Oral TID  . aspirin EC  81 mg Oral Daily  . carvedilol  12.5 mg Oral BID  . furosemide  40 mg Intravenous Q12H  . insulin aspart  0-5 Units Subcutaneous QHS  . insulin aspart  0-9 Units Subcutaneous TID WC  . [START ON 03/21/2016] potassium chloride  20 mEq Oral Daily  . sodium chloride flush  3 mL Intravenous Q12H  . warfarin  5 mg Oral Once  . Warfarin - Pharmacist Dosing Inpatient   Does not apply q1800   Continuous Infusions:   LOS: 1 day    Time spent: 25 minutes. Greater than 50% of this time was spent in direct contact  with the patient coordinating care.     Lelon Frohlich, MD [facility] Pager [phone]  If 7PM-7AM, please contact night-coverage [email or web address] Password [facility] 03/20/2016, 4:45 PM

## 2016-03-20 NOTE — Consult Note (Signed)
CARDIOLOGY CONSULT NOTE   Patient ID: MAIGAN BITTINGER MRN: [id number] DOB/AGE: 79-Apr-1937 79 y.o.  Admit Date: 03/19/2016 Referring Physician: TRH-Hernandez  Primary Physician: Robert Bellow, MD Consulting Cardiologist: Kate Sable MD Primary Cardiologist: Kate Sable MD Reason for Consultation: CHF  Clinical Summary [name] is a 81 y.o.female with known history of diabetes, hypertension, CKD, Stage III,  and atrial fibrillation. Was last seen by Dr. Bronson Ing on 02/26/2016, and was started on coreg 3.125 mg BID,warfarin due to CHADS VASC Score of 5, and enrolled in coumadin clinic. Echocardiogram was ordered. Results revealed reduced EF of 40% and she was called to come to office for closer follow up. Office was unable to contact her. She admits to frequent salt intake.   She presented to the ER with respiratory distress, acute systolic CHF, and COPD exacerbation.   On arrival to ER, BP 143/82, HR 94 bpm, O2 Sat 100%, afebrile. She was placed on BiPAP. Labs revealed hypokalemia, potassium of 3.4, creatinine of 1.28. BNP 213. Hgb 10.5.Hct 34.1. INR 1.89. CXR revealed cardiomegaly without evidence of CHF. She was treated with neb tx and IV lasix, along with IV steroids. EKG revealed LBBB with underlying atrial fib unchanged from previous EKG in January 2018. Troponin <0.03.   No Known Allergies  Medications Scheduled Medications: . amoxicillin  500 mg Oral TID  . aspirin EC  81 mg Oral Daily  . carvedilol  6.25 mg Oral BID  . furosemide  40 mg Intravenous Q12H  . insulin aspart  0-5 Units Subcutaneous QHS  . insulin aspart  0-9 Units Subcutaneous TID WC  . sodium chloride flush  3 mL Intravenous Q12H  . warfarin  5 mg Oral Once  . Warfarin - Pharmacist Dosing Inpatient   Does not apply q1800      PRN Medications:  sodium chloride, acetaminophen, ondansetron (ZOFRAN) IV, sodium chloride flush   Past Medical History:  Diagnosis Date  . Anxiety   .  Diabetes mellitus without complication ([place])   . Hypertension     Past Surgical History:  Procedure Laterality Date  . BACK SURGERY    . EYE SURGERY      Family History  Problem Relation Age of Onset  . Family history unknown: Yes     Social History [name] reports that she has never smoked. She has never used smokeless tobacco. [name] reports that she does not drink alcohol.  Review of Systems Complete review of systems are found to be negative unless outlined in H&P above.  Physical Examination Blood pressure (!) 148/72, pulse 82, temperature 98.9 F (37.2 C), temperature source Oral, resp. rate 18, height 5\' 6"  (1.676 m), weight 214 lb 11.7 oz (97.4 kg), SpO2 97 %.  Intake/Output Summary (Last 24 hours) at 03/20/16 1030 Last data filed at 03/20/16 1007  Gross per 24 hour  Intake              723 ml  Output             1500 ml  Net             -777 ml    Telemetry: Atrial fib with LBBB  GEN: No acute distress.  HEENT: Conjunctiva and lids normal, oropharynx clear with moist mucosa. Neck: Supple, no elevated JVP or carotid bruits, no thyromegaly. Lungs: Clear to auscultation, nonlabored breathing at rest. Cardiac: Irregular rate and rhythm, systolic murmur, best heard at the LSB.  Abdomen: Soft, nontender, no hepatomegaly,  bowel sounds present, no guarding or rebound. Extremities: No pitting edema, distal pulses 2+.Mild non-pitting edema of the left leg.  Skin: Warm and dry. Musculoskeletal: No kyphosis. Neuropsychiatric: Alert and oriented x3, affect grossly appropriate.  Prior Cardiac Testing/Procedures  Echocardiogram 03/04/2016 Left ventricle: The cavity size was normal. Wall thickness was   increased in a pattern of mild LVH. Systolic function was mildly   reduced. The estimated ejection fraction was in the range of 45%   to 50%. Diffuse hypokinesis. The study is not technically   sufficient to allow evaluation of LV diastolic function. - Aortic  valve: Moderately calcified annulus. Trileaflet; mildly   calcified leaflets. There was mild regurgitation. - Ascending aorta: The ascending aorta was mildly dilated. - Mitral valve: There was mild regurgitation. - Left atrium: The atrium was moderately to severely dilated. - Right atrium: Central venous pressure (est): 15 mm Hg. - Tricuspid valve: There was mild regurgitation. - Pulmonary arteries: PA peak pressure: 44 mm Hg (S). - Pericardium, extracardiac: There was no pericardial effusion.   Lab Results  Basic Metabolic Panel:  Recent Labs Lab 03/19/16 0600 03/20/16 0409  NA 139 142  K 3.4* 3.2*  CL 104 102  CO2 26 28  GLUCOSE 232* 179*  BUN 23* 25*  CREATININE 1.28* 1.27*  CALCIUM 8.2* 8.5*    Liver Function Tests:  Recent Labs Lab 03/19/16 0600  AST 26  ALT 17  ALKPHOS 76  BILITOT 1.4*  PROT 7.5  ALBUMIN 3.9    CBC:  Recent Labs Lab 03/19/16 0552  WBC 11.9*  NEUTROABS 10.2*  HGB 10.5*  HCT 34.1*  MCV 94.7  PLT 353    Cardiac Enzymes:  Recent Labs Lab 03/19/16 0600  TROPONINI <0.03     Radiology: Dg Chest Port 1 View  Result Date: 03/19/2016 CLINICAL DATA:  Shortness of breath EXAM: PORTABLE CHEST 1 VIEW COMPARISON:  Chest radiograph 12/29/2005 FINDINGS: There are bibasilar opacities. Cardiomediastinal silhouette is mildly enlarged with atherosclerotic calcification in the aortic arch. No pneumothorax. IMPRESSION: Bibasilar opacities, suspected to be atelectasis. Mild cardiomegaly and aortic atherosclerosis. Electronically Signed   By: Ulyses Jarred M.D.   On: 03/19/2016 06:25     ECG: Atrial fib with LBBB    Impression and Recommendations  1.Acute on Chronic Systolic CHF: She has diuresed 2.1 liter overnight with total output 1.627 since admission. Weight is down from 217 lbs to 214 lbs. She is breathing and feeling better with diureses. Will replete potassium to keep it at least 4.0. Will consider increasing coreg dose as HR and BP at  rest, and is not optimal for reduced EF. Continue IV diureses. Yet to determine "dry wt." Check magnesium. Creatinine 1.27 this am. Follow up labs. May need to consider ischemic work up at some point once with CVRF.   2. Hypertension: BP is slightly elevated this am. Will continue to diurese. May consider increasing coreg if remains elevated.    3. Atrial fib: Heart rate is moderately controlled. On coumadin per pharmacy. Anemia noted. Likely related to CKD.   4. Diabetes: Per PCP   Signed: Phill Myron. Lawrence NP [place]  03/20/2016, 10:30 AM Co-Sign MD  The patient was seen and examined, and I agree with the history, physical exam, assessment and plan as documented above, with modifications as noted below. Pt known to me from clinic admitted for shortness of breath which began acutely on the morning of 2/22. Denies fevers/chills and chest pain. BNP mildly elevated at 213. Chest xray  without pulmonary edema.  Admitted for acute systolic heart failure, EF 45-50% with diffuse hypokinesis. She has atrial fibrillation and I initiated Coreg and warfarin on 02/26/16.  I then increased Coreg to 6.25 mg bid for more optimal HR and BP control on 03/09/16.  She is now feeling much better. HR in high 80 bpm range and BP remains elevated. I will increase Coreg to 12.5 mg bid for more optimal BP and HR control.  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  Continue IV Lasix 40 mg bid for now.   Consider discharging on 40 mg daily if she goes home over the weekend, which I suspect she will.  Kate Sable, MD, [facility]  03/20/2016 12:05 PM

## 2016-03-20 NOTE — Plan of Care (Signed)
Problem: Food- and Nutrition-Related Knowledge Deficit (NB-1.1) Goal: Nutrition education Formal process to instruct or train a patient/client in a skill or to impart knowledge to help patients/clients voluntarily manage or modify food choices and eating behavior to maintain or improve health. Outcome: Adequate for Discharge Nutrition Education Note  RD consulted for nutrition education regarding new onset CHF.  RD provided "Eating Plan for Heart Failure" handout. Reviewed patient's dietary recall. Provided examples on ways to decrease sodium intake in diet. Discouraged intake of processed foods and use of salt shaker. Encouraged fresh fruits and vegetables as well as whole grain sources of carbohydrates to maximize fiber intake.   RD discussed why it is important for patient to adhere to diet recommendations, and emphasized the role of fluids, foods to avoid, and importance of weighing self daily. Teach back method used.  Expect good compliance. Patient affirms she is ready to make changes in her usual habits such as using the salt shaker and adding cheese to her sandwiches.   Body mass index is 34.66 kg/m. Pt meets criteria for obese class I based on current BMI.  Current diet order is Heart Healthy/CHO Modified, patient is consuming approximately 75% of meals at this time. Labs and medications reviewed. No further nutrition interventions warranted at this time. RD contact information provided. If additional nutrition issues arise, please re-consult RD.   Colman Cater MS,RD,CSG,LDN Office: [phone] Pager: [phone]

## 2016-03-21 LAB — BASIC METABOLIC PANEL
Anion gap: 12 (ref 5–15)
BUN: 25 mg/dL — AB (ref 6–20)
CO2: 28 mmol/L (ref 22–32)
CREATININE: 1.32 mg/dL — AB (ref 0.44–1.00)
Calcium: 8.5 mg/dL — ABNORMAL LOW (ref 8.9–10.3)
Chloride: 101 mmol/L (ref 101–111)
GFR calc Af Amer: 44 mL/min — ABNORMAL LOW (ref >60)
GFR, EST NON AFRICAN AMERICAN: 38 mL/min — AB (ref >60)
Glucose, Bld: 155 mg/dL — ABNORMAL HIGH (ref 65–99)
Potassium: 4 mmol/L (ref 3.5–5.1)
SODIUM: 141 mmol/L (ref 135–145)

## 2016-03-21 LAB — GLUCOSE, CAPILLARY
GLUCOSE-CAPILLARY: 154 mg/dL — AB (ref 65–99)
GLUCOSE-CAPILLARY: 117 mg/dL — AB (ref 65–99)
Glucose-Capillary: 160 mg/dL — ABNORMAL HIGH (ref 65–99)

## 2016-03-21 LAB — PROTIME-INR
INR: 2
PROTHROMBIN TIME: 23 s — AB (ref 11.4–15.2)

## 2016-03-21 MED ORDER — BISACODYL 10 MG RE SUPP
10.0000 mg | Freq: Once | RECTAL | Status: AC
  Administered 2016-03-21: 10 mg via RECTAL
  Filled 2016-03-21: qty 1

## 2016-03-21 MED ORDER — WARFARIN SODIUM 5 MG PO TABS
5.0000 mg | ORAL_TABLET | Freq: Once | ORAL | Status: AC
  Administered 2016-03-21: 5 mg via ORAL
  Filled 2016-03-21: qty 1

## 2016-03-21 NOTE — Progress Notes (Signed)
ANTICOAGULATION CONSULT NOTE - follow up  Pharmacy Consult for coumadin Indication: atrial fibrillation  No Known Allergies  Patient Measurements: Height: 5\' 6"  (167.6 cm) Weight: 214 lb 11.7 oz (97.4 kg) IBW/kg (Calculated) : 59.3   Vital Signs: Temp: 98.6 F (37 C) (02/24 0652) Temp Source: Oral (02/24 0652) BP: 134/94 (02/24 0652) Pulse Rate: 93 (02/24 0652)  Labs:  Recent Labs  03/19/16 0552 03/19/16 0600 03/19/16 1654 03/20/16 0409 03/21/16 0609  HGB 10.5*  --   --   --   --   HCT 34.1*  --   --   --   --   PLT 353  --   --   --   --   LABPROT  --   --  19.6* 22.0* 23.0*  INR  --   --  1.64 1.89 2.00  CREATININE  --  1.28*  --  1.27* 1.32*  TROPONINI  --  <0.03  --   --   --     Estimated Creatinine Clearance: 41.3 mL/min (by C-G formula based on SCr of 1.32 mg/dL (H)).  Medical History: Past Medical History:  Diagnosis Date  . Anxiety   . Diabetes mellitus without complication ([place])   . Hypertension    Medications:  Prescriptions Prior to Admission  Medication Sig Dispense Refill Last Dose  . amLODipine (NORVASC) 10 MG tablet Take 10 mg by mouth daily.   03/18/2016 at Unknown time  . amoxicillin (AMOXIL) 500 MG capsule Take 1 capsule (500 mg total) by mouth 3 (three) times daily. 30 capsule 0 03/18/2016 at Unknown time  . aspirin EC 81 MG tablet Take 81 mg by mouth daily.   03/18/2016 at Unknown time  . carvedilol (COREG) 3.125 MG tablet Take 1 tablet (3.125 mg total) by mouth 2 (two) times daily. 180 tablet 3 03/18/2016 at 1800  . furosemide (LASIX) 20 MG tablet Take 20 mg by mouth.   03/18/2016 at Unknown time  . metFORMIN (GLUCOPHAGE) 500 MG tablet Take 1,000 mg by mouth 2 (two) times daily with a meal.   03/18/2016 at Unknown time  . warfarin (COUMADIN) 5 MG tablet Take 1 tablet daily except 1 1/2 tablets on Mondays and Thursdays 40 tablet 3 03/18/2016 at 1800   Assessment: 81 yo lady to continue coumadin for afib.  INR now therapeutic.    Goal of  Therapy:  INR 2-3 Monitor platelets by anticoagulation protocol: Yes   Plan:  Coumadin 5 mg po today Daily PT/INR Monitor for bleeding complications  Hart Robinsons A 03/21/2016,8:36 AM

## 2016-03-21 NOTE — Progress Notes (Signed)
PROGRESS NOTE    Barbara Stone  DZH:[id number] DOB: 19-May-1935 DOA: 03/19/2016 PCP: Robert Bellow, MD     Brief Narrative:  81 year old woman admitted to the hospital from home on 2/22 due to shortness of breath, found to have acute systolic heart failure which is new onset.   Assessment & Plan:   Active Problems:   Acute systolic CHF (congestive heart failure) (HCC)   Atrial fibrillation (HCC) [I48.91]   Acute respiratory failure with hypoxemia (HCC)   HTN (hypertension)   CKD (chronic kidney disease) stage 3, GFR 30-59 ml/min   Acute systolic CHF -Echo with ejection fraction of 45-50% with diffuse hypokinesis. Suspect may be tachycardia mediated due to her uncontrolled A. Fib. -She is 1.4 L negative since admission, volume status is improved but is still hypervolemic. -Plan to continue Lasix 40 mg IV twice a day. -Not on ACE inhibitor due to chronic kidney disease.  Atrial fibrillation -Rates are improved in the 80s to 90s. -Coreg has been increased to 12.5 mg twice a day for more optimal heart rate and blood pressure control. -She is on Coumadin for anticoagulation purposes.  Acute hypoxemic respiratory failure -Due to acute systolic CHF, improved, does not have any current oxygen requirements.  Chronic kidney disease stage III  -creatinine remains at baseline of between 1.1 and 1.3, would anticipate some worsening with diuresis. -No ACE inhibitor due to this.  Hypokalemia -Due to diuresis, replete orally and check magnesium.  DVT prophylaxis: Warfarin  Code Status: Full code  Family Communication: Multiple family members at bedside updated on plan of care and questions answered  Disposition Plan: Anticipate discharge home in 48-72 hours  Consultants:  Cardiology  Procedures:  None  Antimicrobials:  Anti-infectives    Start     Dose/Rate Route Frequency Ordered Stop   03/19/16 1645  amoxicillin (AMOXIL) capsule 500 mg     500 mg Oral 3 times daily  03/19/16 1635 03/21/16 0947       Subjective: Feels much improved, anxious for discharge home.   Objective: Vitals:   03/20/16 1316 03/20/16 1447 03/20/16 2156 03/21/16 0652  BP: (!) 148/74 (!) 144/71 (!) 143/73 (!) 134/94  Pulse: 87 86 90 93  Resp:  18 18 18   Temp:  98.5 F (36.9 C) 98.5 F (36.9 C) 98.6 F (37 C)  TempSrc:  Oral Oral Oral  SpO2:  98% 96% 98%  Weight:      Height:       No intake or output data in the 24 hours ending 03/21/16 1650 Filed Weights   03/19/16 1217 03/19/16 1623 03/20/16 0500  Weight: 98.7 kg (217 lb 9.5 oz) 98.7 kg (217 lb 9.5 oz) 97.4 kg (214 lb 11.7 oz)    Examination:  General exam: Alert, awake, oriented x 3 Respiratory system: Clear to auscultation. Respiratory effort normal. Cardiovascular system:RRR. No murmurs, rubs, gallops. Gastrointestinal system: Abdomen is nondistended, soft and nontender. No organomegaly or masses felt. Normal bowel sounds heard. Central nervous system: Alert and oriented. No focal neurological deficits. Extremities:2+ pitting edema bilaterally  Skin: No rashes, lesions or ulcers Psychiatry: Judgement and insight appear normal. Mood & affect appropriate.     Data Reviewed: I have personally reviewed following labs and imaging studies  CBC:  Recent Labs Lab 03/19/16 0552  WBC 11.9*  NEUTROABS 10.2*  HGB 10.5*  HCT 34.1*  MCV 94.7  PLT 419   Basic Metabolic Panel:  Recent Labs Lab 03/19/16 0600 03/20/16 0409 03/20/16 1811 03/21/16 6222  NA 139 142  --  141  K 3.4* 3.2*  --  4.0  CL 104 102  --  101  CO2 26 28  --  28  GLUCOSE 232* 179*  --  155*  BUN 23* 25*  --  25*  CREATININE 1.28* 1.27*  --  1.32*  CALCIUM 8.2* 8.5*  --  8.5*  MG  --   --  1.5*  --    GFR: Estimated Creatinine Clearance: 41.3 mL/min (by C-G formula based on SCr of 1.32 mg/dL (H)). Liver Function Tests:  Recent Labs Lab 03/19/16 0600  AST 26  ALT 17  ALKPHOS 76  BILITOT 1.4*  PROT 7.5  ALBUMIN 3.9    No results for input(s): LIPASE, AMYLASE in the last 168 hours. No results for input(s): AMMONIA in the last 168 hours. Coagulation Profile:  Recent Labs Lab 03/16/16 1120 03/19/16 1654 03/20/16 0409 03/21/16 0609  INR 1.5 1.64 1.89 2.00   Cardiac Enzymes:  Recent Labs Lab 03/19/16 0600  TROPONINI <0.03   BNP (last 3 results) No results for input(s): PROBNP in the last 8760 hours. HbA1C:  Recent Labs  03/19/16 0600  HGBA1C 6.3*   CBG:  Recent Labs Lab 03/20/16 1627 03/20/16 2154 03/21/16 0743 03/21/16 1140 03/21/16 1647  GLUCAP 139* 158* 160* 154* 117*   Lipid Profile: No results for input(s): CHOL, HDL, LDLCALC, TRIG, CHOLHDL, LDLDIRECT in the last 72 hours. Thyroid Function Tests: No results for input(s): TSH, T4TOTAL, FREET4, T3FREE, THYROIDAB in the last 72 hours. Anemia Panel: No results for input(s): VITAMINB12, FOLATE, FERRITIN, TIBC, IRON, RETICCTPCT in the last 72 hours. Urine analysis:    Component Value Date/Time   COLORURINE YELLOW 07/01/2015 1700   APPEARANCEUR CLEAR 07/01/2015 1700   LABSPEC <1.005 (L) 07/01/2015 1700   PHURINE 6.0 07/01/2015 1700   GLUCOSEU NEGATIVE 07/01/2015 1700   HGBUR NEGATIVE 07/01/2015 1700   BILIRUBINUR NEGATIVE 07/01/2015 1700   KETONESUR NEGATIVE 07/01/2015 1700   PROTEINUR NEGATIVE 07/01/2015 1700   UROBILINOGEN 0.2 09/06/2006 1425   NITRITE NEGATIVE 07/01/2015 1700   LEUKOCYTESUR NEGATIVE 07/01/2015 1700   Sepsis Labs: @LABRCNTIP (procalcitonin:4,lacticidven:4)  )No results found for this or any previous visit (from the past 240 hour(s)).       Radiology Studies: No results found.      Scheduled Meds: . aspirin EC  81 mg Oral Daily  . carvedilol  12.5 mg Oral BID  . furosemide  40 mg Intravenous Q12H  . insulin aspart  0-5 Units Subcutaneous QHS  . insulin aspart  0-9 Units Subcutaneous TID WC  . potassium chloride  20 mEq Oral Daily  . sodium chloride flush  3 mL Intravenous Q12H  .  Warfarin - Pharmacist Dosing Inpatient   Does not apply q1800   Continuous Infusions:   LOS: 2 days    Time spent: 25 minutes. Greater than 50% of this time was spent in direct contact with the patient coordinating care.     Lelon Frohlich, MD [facility] Pager [phone]  If 7PM-7AM, please contact night-coverage [email or web address] Password [facility] 03/21/2016, 4:50 PM

## 2016-03-22 LAB — BASIC METABOLIC PANEL
Anion gap: 10 (ref 5–15)
BUN: 28 mg/dL — ABNORMAL HIGH (ref 6–20)
CHLORIDE: 101 mmol/L (ref 101–111)
CO2: 30 mmol/L (ref 22–32)
Calcium: 8.4 mg/dL — ABNORMAL LOW (ref 8.9–10.3)
Creatinine, Ser: 1.42 mg/dL — ABNORMAL HIGH (ref 0.44–1.00)
GFR calc non Af Amer: 34 mL/min — ABNORMAL LOW (ref >60)
GFR, EST AFRICAN AMERICAN: 40 mL/min — AB (ref >60)
Glucose, Bld: 144 mg/dL — ABNORMAL HIGH (ref 65–99)
POTASSIUM: 3.5 mmol/L (ref 3.5–5.1)
SODIUM: 141 mmol/L (ref 135–145)

## 2016-03-22 LAB — GLUCOSE, CAPILLARY
GLUCOSE-CAPILLARY: 156 mg/dL — AB (ref 65–99)
GLUCOSE-CAPILLARY: 183 mg/dL — AB (ref 65–99)
Glucose-Capillary: 191 mg/dL — ABNORMAL HIGH (ref 65–99)

## 2016-03-22 LAB — PROTIME-INR
INR: 2.17
PROTHROMBIN TIME: 24.5 s — AB (ref 11.4–15.2)

## 2016-03-22 MED ORDER — POTASSIUM CHLORIDE CRYS ER 20 MEQ PO TBCR: 20.0000 meq | EXTENDED_RELEASE_TABLET | Freq: Every day | ORAL | 2 refills | Status: DC

## 2016-03-22 MED ORDER — FUROSEMIDE 40 MG PO TABS: 40.0000 mg | ORAL_TABLET | Freq: Every day | ORAL | 2 refills | Status: DC

## 2016-03-22 MED ORDER — WARFARIN SODIUM 5 MG PO TABS: 5.0000 mg | ORAL_TABLET | Freq: Once | ORAL | Status: DC

## 2016-03-22 MED ORDER — CARVEDILOL 12.5 MG PO TABS: 12.5000 mg | ORAL_TABLET | Freq: Two times a day (BID) | ORAL | 2 refills | Status: DC

## 2016-03-22 NOTE — Progress Notes (Signed)
Pt discharged in stable condition into the care of her daughter via wheelchair via private vehicle.  PIV removed intact and w/o S&S of complications.  Discharge instructions reviewed with pt/daughter. Pt/daughter verbalized understanding.  Prescription given to pt.

## 2016-03-22 NOTE — Discharge Summary (Signed)
Physician Discharge Summary  DAN DISSINGER KKX:[id number] DOB: November 24, 1935 DOA: 03/19/2016  PCP: Robert Bellow, MD  Admit date: 03/19/2016 Discharge date: 03/22/2016  Time spent: 45 minutes  Recommendations for Outpatient Follow-up:  -Will follow up with cardiologist as scheduled.   Discharge Diagnoses:  Active Problems:   Acute systolic CHF (congestive heart failure) (HCC)   Atrial fibrillation (HCC) [I48.91]   Acute respiratory failure with hypoxemia (HCC)   HTN (hypertension)   CKD (chronic kidney disease) stage 3, GFR 30-59 ml/min   Discharge Condition: Stable and improved  Filed Weights   03/19/16 1623 03/20/16 0500 03/22/16 0500  Weight: 98.7 kg (217 lb 9.5 oz) 97.4 kg (214 lb 11.7 oz) 94.3 kg (207 lb 14.4 oz)    History of present illness:  Barbara Stone is a 81 y.o. female with history of hypertension and type 2 diabetes on metformin was recently seen by cardiology due to dizziness and hypertension, echocardiogram was ordered and showed a mildly reduced ejection fraction of 45%. This morning patient woke up and thought she had a chest cold because she felt very short of breath. Shortness of breath progressed very rapidly to the point where they had to call EMS. She was placed on a BiPAP. Upon arrival to the ED was noted to have significant crackles and decreased oxygen sats. After a dose of Lasix with significant urinary output or shortness of breath improved and she was able to be taken off BiPAP. Admission has been requested for further evaluation and management.  Hospital Course:   Acute systolic CHF -Echo with ejection fraction of 45-50% with diffuse hypokinesis. Suspect may be tachycardia mediated due to her uncontrolled A. Fib. -She is 1.4 L negative since admission, She is now euvolemic. -We'll discharge home on Lasix 40 mg daily. -Not on ACE inhibitor due to chronic kidney disease.  Atrial fibrillation -Rates are improved in the 80s to 90s. -Coreg has  been increased to 12.5 mg twice a day for more optimal heart rate and blood pressure control. -She is on Coumadin for anticoagulation purposes.  Acute hypoxemic respiratory failure -Due to acute systolic CHF, improved, does not have any current oxygen requirements.  Chronic kidney disease stage III  -creatinine remains at baseline of between 1.1 and 1.3, would anticipate some worsening with diuresis. -No ACE inhibitor due to this.  Hypokalemia -Due to diuresis, replete orally and check magnesium.   Procedures:  None   Consultations:  Cardiology  Discharge Instructions  Discharge Instructions    Diet - low sodium heart healthy    Complete by:  As directed    Increase activity slowly    Complete by:  As directed      Allergies as of 03/22/2016   No Known Allergies     Medication List    STOP taking these medications   amLODipine 10 MG tablet Commonly known as:  NORVASC   amoxicillin 500 MG capsule Commonly known as:  AMOXIL     TAKE these medications   aspirin EC 81 MG tablet Take 81 mg by mouth daily.   carvedilol 12.5 MG tablet Commonly known as:  COREG Take 1 tablet (12.5 mg total) by mouth 2 (two) times daily. What changed:  medication strength  how much to take   furosemide 40 MG tablet Commonly known as:  LASIX Take 1 tablet (40 mg total) by mouth daily. What changed:  medication strength  how much to take  when to take this   metFORMIN  500 MG tablet Commonly known as:  GLUCOPHAGE Take 1,000 mg by mouth 2 (two) times daily with a meal.   potassium chloride SA 20 MEQ tablet Commonly known as:  K-DUR,KLOR-CON Take 1 tablet (20 mEq total) by mouth daily. Start taking on:  03/23/2016   warfarin 5 MG tablet Commonly known as:  COUMADIN Take 1 tablet daily except 1 1/2 tablets on Mondays and Thursdays      No Known Allergies Follow-up Information    Kate Sable, MD. Schedule an appointment as soon as possible for a visit in 1  week(s).   Specialty:  Cardiology Contact information: [place] [address] [phone]            The results of significant diagnostics from this hospitalization (including imaging, microbiology, ancillary and laboratory) are listed below for reference.    Significant Diagnostic Studies: Dg Chest Port 1 View  Result Date: 03/19/2016 CLINICAL DATA:  Shortness of breath EXAM: PORTABLE CHEST 1 VIEW COMPARISON:  Chest radiograph 12/29/2005 FINDINGS: There are bibasilar opacities. Cardiomediastinal silhouette is mildly enlarged with atherosclerotic calcification in the aortic arch. No pneumothorax. IMPRESSION: Bibasilar opacities, suspected to be atelectasis. Mild cardiomegaly and aortic atherosclerosis. Electronically Signed   By: Ulyses Jarred M.D.   On: 03/19/2016 06:25    Microbiology: No results found for this or any previous visit (from the past 240 hour(s)).   Labs: Basic Metabolic Panel:  Recent Labs Lab 03/19/16 0600 03/20/16 0409 03/20/16 1811 03/21/16 0609 03/22/16 0613  NA 139 142  --  141 141  K 3.4* 3.2*  --  4.0 3.5  CL 104 102  --  101 101  CO2 26 28  --  28 30  GLUCOSE 232* 179*  --  155* 144*  BUN 23* 25*  --  25* 28*  CREATININE 1.28* 1.27*  --  1.32* 1.42*  CALCIUM 8.2* 8.5*  --  8.5* 8.4*  MG  --   --  1.5*  --   --    Liver Function Tests:  Recent Labs Lab 03/19/16 0600  AST 26  ALT 17  ALKPHOS 76  BILITOT 1.4*  PROT 7.5  ALBUMIN 3.9   No results for input(s): LIPASE, AMYLASE in the last 168 hours. No results for input(s): AMMONIA in the last 168 hours. CBC:  Recent Labs Lab 03/19/16 0552  WBC 11.9*  NEUTROABS 10.2*  HGB 10.5*  HCT 34.1*  MCV 94.7  PLT 353   Cardiac Enzymes:  Recent Labs Lab 03/19/16 0600  TROPONINI <0.03   BNP: BNP (last 3 results)  Recent Labs  03/19/16 0600  BNP 213.0*    ProBNP (last 3 results) No results for input(s): PROBNP in the last 8760 hours.  CBG:  Recent Labs Lab  03/21/16 1140 03/21/16 1647 03/21/16 2113 03/22/16 0800 03/22/16 1126  GLUCAP 154* 117* 191* 156* 183*       Signed:  HERNANDEZ ACOSTA,ESTELA  [facility] Pager: [phone] 03/22/2016, 4:39 PM

## 2016-03-22 NOTE — Progress Notes (Signed)
ANTICOAGULATION CONSULT NOTE - follow up  Pharmacy Consult for coumadin Indication: atrial fibrillation  No Known Allergies  Patient Measurements: Height: 5\' 6"  (167.6 cm) Weight: 207 lb 14.4 oz (94.3 kg) IBW/kg (Calculated) : 59.3   Vital Signs: Temp: 98.7 F (37.1 C) (02/25 0649) Temp Source: Oral (02/25 0649) BP: 130/71 (02/25 0649) Pulse Rate: 83 (02/25 0649)  Labs:  Recent Labs  03/20/16 0409 03/21/16 0609 03/22/16 0613  LABPROT 22.0* 23.0* 24.5*  INR 1.89 2.00 2.17  CREATININE 1.27* 1.32* 1.42*    Estimated Creatinine Clearance: 37.8 mL/min (by C-G formula based on SCr of 1.42 mg/dL (H)).  Medical History: Past Medical History:  Diagnosis Date  . Anxiety   . Diabetes mellitus without complication ([place])   . Hypertension    Medications:  Prescriptions Prior to Admission  Medication Sig Dispense Refill Last Dose  . amLODipine (NORVASC) 10 MG tablet Take 10 mg by mouth daily.   03/18/2016 at Unknown time  . amoxicillin (AMOXIL) 500 MG capsule Take 1 capsule (500 mg total) by mouth 3 (three) times daily. 30 capsule 0 03/18/2016 at Unknown time  . aspirin EC 81 MG tablet Take 81 mg by mouth daily.   03/18/2016 at Unknown time  . carvedilol (COREG) 3.125 MG tablet Take 1 tablet (3.125 mg total) by mouth 2 (two) times daily. 180 tablet 3 03/18/2016 at 1800  . furosemide (LASIX) 20 MG tablet Take 20 mg by mouth.   03/18/2016 at Unknown time  . metFORMIN (GLUCOPHAGE) 500 MG tablet Take 1,000 mg by mouth 2 (two) times daily with a meal.   03/18/2016 at Unknown time  . warfarin (COUMADIN) 5 MG tablet Take 1 tablet daily except 1 1/2 tablets on Mondays and Thursdays 40 tablet 3 03/18/2016 at 1800   Assessment: 81 yo lady to continue coumadin for afib.  INR now therapeutic.    Goal of Therapy:  INR 2-3 Monitor platelets by anticoagulation protocol: Yes   Plan:  Coumadin 5 mg po today Daily PT/INR Monitor for bleeding complications  Hart Robinsons A 03/22/2016,9:44 AM

## 2016-04-01 ENCOUNTER — Encounter: Payer: Self-pay | Admitting: Cardiovascular Disease

## 2016-04-01 ENCOUNTER — Ambulatory Visit (INDEPENDENT_AMBULATORY_CARE_PROVIDER_SITE_OTHER): Payer: Medicare Other | Admitting: Cardiovascular Disease

## 2016-04-01 ENCOUNTER — Ambulatory Visit (INDEPENDENT_AMBULATORY_CARE_PROVIDER_SITE_OTHER): Payer: Medicare Other | Admitting: *Deleted

## 2016-04-01 VITALS — BP 124/68 | HR 89 | Ht 66.0 in | Wt 201.0 lb

## 2016-04-01 DIAGNOSIS — I481 Persistent atrial fibrillation: Secondary | ICD-10-CM

## 2016-04-01 DIAGNOSIS — I10 Essential (primary) hypertension: Secondary | ICD-10-CM | POA: Diagnosis not present

## 2016-04-01 DIAGNOSIS — Z5181 Encounter for therapeutic drug level monitoring: Secondary | ICD-10-CM | POA: Diagnosis not present

## 2016-04-01 DIAGNOSIS — I4819 Other persistent atrial fibrillation: Secondary | ICD-10-CM

## 2016-04-01 DIAGNOSIS — I4891 Unspecified atrial fibrillation: Secondary | ICD-10-CM | POA: Diagnosis not present

## 2016-04-01 DIAGNOSIS — I5022 Chronic systolic (congestive) heart failure: Secondary | ICD-10-CM | POA: Diagnosis not present

## 2016-04-01 DIAGNOSIS — Z713 Dietary counseling and surveillance: Secondary | ICD-10-CM

## 2016-04-01 LAB — POCT INR: INR: 2.7

## 2016-04-01 NOTE — Progress Notes (Signed)
SUBJECTIVE: The patient presents for follow-up after being hospitalized for acute on chronic systolic CHF, EF 88-41% with diffuse hypokinesis. She also has atrial fibrillation and I initiated Coreg and warfarin on 02/26/16. She also has hypertension. 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 She also has diabetes, hypertension, and chronic kidney disease stage III.  Weight today 201 pounds (207 pounds on 03/22/16).  She is feeling very well and denies chest pain, palpitations, leg swelling, and shortness of breath. She has been trying to adhere to a low-sodium diet and trying to avoid fast foods.   Review of Systems: As per "subjective", otherwise negative.  No Known Allergies  Current Outpatient Prescriptions  Medication Sig Dispense Refill  . aspirin EC 81 MG tablet Take 81 mg by mouth daily.    . carvedilol (COREG) 12.5 MG tablet Take 1 tablet (12.5 mg total) by mouth 2 (two) times daily. 60 tablet 2  . furosemide (LASIX) 40 MG tablet Take 1 tablet (40 mg total) by mouth daily. 30 tablet 2  . metFORMIN (GLUCOPHAGE) 500 MG tablet Take 1,000 mg by mouth 2 (two) times daily with a meal.    . potassium chloride SA (K-DUR,KLOR-CON) 20 MEQ tablet Take 1 tablet (20 mEq total) by mouth daily. 30 tablet 2  . warfarin (COUMADIN) 5 MG tablet Take 1 tablet daily except 1 1/2 tablets on Mondays and Thursdays 40 tablet 3   No current facility-administered medications for this visit.     Past Medical History:  Diagnosis Date  . Anxiety   . Diabetes mellitus without complication ([place])   . Hypertension     Past Surgical History:  Procedure Laterality Date  . BACK SURGERY    . EYE SURGERY      Social History   Social History  . Marital status: Widowed    Spouse name: N/A  . Number of children: N/A  . Years of education: N/A   Occupational History  . Not on file.   Social History Main Topics  . Smoking status: Never Smoker  . Smokeless tobacco: Never Used  .  Alcohol use No  . Drug use: No  . Sexual activity: Not on file   Other Topics Concern  . Not on file   Social History Narrative  . No narrative on file     Vitals:   04/01/16 0906  BP: 124/68  Pulse: 89  SpO2: 98%  Weight: 201 lb (91.2 kg)  Height: 5\' 6"  (1.676 m)    PHYSICAL EXAM General: NAD HEENT: Normal. Neck: No JVD, no thyromegaly. Lungs: Clear to auscultation bilaterally with normal respiratory effort. CV: Nondisplaced PMI.  Regular rate and irregular rhythm, normal S1/S2, no S3, no murmur. No pretibial or periankle edema.     Abdomen: Soft, nontender, no distention.  Neurologic: Alert and oriented.  Psych: Normal affect. Skin: Normal. Musculoskeletal: No gross deformities.    ECG: Most recent ECG reviewed.      ASSESSMENT AND PLAN: 1. Chronic systolic heart failure, EF 45-50%: Currently on carvedilol. No ACE inhibitor's or ARB's due to chronic disease stage III. Euvolemic on Lasix 40 mg daily. Educated on the importance of a low sodium diet and I provided handouts. I also directed her to the [place] website which has a lot of valuable free information for patients and how to read nutritional labels.  2. Persistent atrial fibrillation: Heart rate is controlled on carvedilol 12.5 mg twice daily. Anticoagulated with warfarin. No changes.  3.  Hypertension: Controlled. No changes.  Dispo: fu 3 months   Kate Sable, M.D., F.A.C.C.

## 2016-04-01 NOTE — Patient Instructions (Signed)
Medication Instructions:  Your physician recommends that you continue on your current medications as directed. Please refer to the Current Medication list given to you today.   Labwork: NOPNE  Testing/Procedures: NONE  Follow-Up: Your physician recommends that you schedule a follow-up appointment in: 3 MONTHS    Any Other Special Instructions Will Be Listed Below (If Applicable).  I have given you a copy of a low sodium diet.   If you need a refill on your cardiac medications before your next appointment, please call your pharmacy.

## 2016-04-20 ENCOUNTER — Ambulatory Visit (INDEPENDENT_AMBULATORY_CARE_PROVIDER_SITE_OTHER): Payer: Medicare Other | Admitting: *Deleted

## 2016-04-20 DIAGNOSIS — Z5181 Encounter for therapeutic drug level monitoring: Secondary | ICD-10-CM

## 2016-04-20 DIAGNOSIS — I4891 Unspecified atrial fibrillation: Secondary | ICD-10-CM | POA: Diagnosis not present

## 2016-04-20 LAB — POCT INR: INR: 4

## 2016-04-24 ENCOUNTER — Telehealth: Payer: Self-pay | Admitting: Cardiovascular Disease

## 2016-04-24 NOTE — Telephone Encounter (Signed)
Pt is using Mrs dash which is ok but was using [employer] which is not ok,pt verbalized understanding

## 2016-04-24 NOTE — Telephone Encounter (Signed)
Pt would like to know if it's ok for her to use a salt substitute, please call @ [phone]

## 2016-05-04 ENCOUNTER — Ambulatory Visit (INDEPENDENT_AMBULATORY_CARE_PROVIDER_SITE_OTHER): Payer: Medicare Other | Admitting: *Deleted

## 2016-05-04 DIAGNOSIS — Z5181 Encounter for therapeutic drug level monitoring: Secondary | ICD-10-CM | POA: Diagnosis not present

## 2016-05-04 DIAGNOSIS — I4891 Unspecified atrial fibrillation: Secondary | ICD-10-CM

## 2016-05-04 LAB — POCT INR: INR: 2.4

## 2016-05-07 ENCOUNTER — Encounter (INDEPENDENT_AMBULATORY_CARE_PROVIDER_SITE_OTHER): Payer: Medicare Other | Admitting: Ophthalmology

## 2016-05-07 DIAGNOSIS — E11311 Type 2 diabetes mellitus with unspecified diabetic retinopathy with macular edema: Secondary | ICD-10-CM

## 2016-05-07 DIAGNOSIS — H35373 Puckering of macula, bilateral: Secondary | ICD-10-CM

## 2016-05-07 DIAGNOSIS — E113291 Type 2 diabetes mellitus with mild nonproliferative diabetic retinopathy without macular edema, right eye: Secondary | ICD-10-CM | POA: Diagnosis not present

## 2016-05-07 DIAGNOSIS — H43811 Vitreous degeneration, right eye: Secondary | ICD-10-CM

## 2016-05-07 DIAGNOSIS — E113512 Type 2 diabetes mellitus with proliferative diabetic retinopathy with macular edema, left eye: Secondary | ICD-10-CM

## 2016-05-07 DIAGNOSIS — H35033 Hypertensive retinopathy, bilateral: Secondary | ICD-10-CM | POA: Diagnosis not present

## 2016-05-07 DIAGNOSIS — I10 Essential (primary) hypertension: Secondary | ICD-10-CM

## 2016-05-19 ENCOUNTER — Telehealth: Payer: Self-pay | Admitting: Cardiovascular Disease

## 2016-05-19 NOTE — Telephone Encounter (Signed)
Patient is asking if it is okay to take a salt substitute. Please call patient./ tg

## 2016-05-19 NOTE — Telephone Encounter (Signed)
Spoke with pt. Informed she may take salt substitute that does not contain salt.

## 2016-05-20 ENCOUNTER — Ambulatory Visit (INDEPENDENT_AMBULATORY_CARE_PROVIDER_SITE_OTHER): Payer: Medicare Other | Admitting: *Deleted

## 2016-05-20 DIAGNOSIS — Z5181 Encounter for therapeutic drug level monitoring: Secondary | ICD-10-CM | POA: Diagnosis not present

## 2016-05-20 DIAGNOSIS — I4891 Unspecified atrial fibrillation: Secondary | ICD-10-CM | POA: Diagnosis not present

## 2016-05-20 LAB — POCT INR: INR: 2

## 2016-06-09 ENCOUNTER — Telehealth: Payer: Self-pay | Admitting: Cardiovascular Disease

## 2016-06-09 NOTE — Telephone Encounter (Signed)
Patient would like to speak with nurse regarding DOE. / tg

## 2016-06-09 NOTE — Telephone Encounter (Signed)
Patient notified and voiced understanding.

## 2016-06-09 NOTE — Telephone Encounter (Signed)
Is she avoiding sodium and fast food? If weight is stable it would indicate current dose of Lasix is sufficient. May have been related to hot humid weather with temps in 90's.

## 2016-06-09 NOTE — Telephone Encounter (Signed)
Patient reports DOE starting on Sunday of this week while walking into church. Pt denies swelling, cough, fever. Pt does weigh herself daily but does not record them. She states that her wt remains around 210 lbs and has not changed. Pt does report have AC in her home. Pt would like to know what the cause maybe. Please advise.

## 2016-06-10 ENCOUNTER — Ambulatory Visit (INDEPENDENT_AMBULATORY_CARE_PROVIDER_SITE_OTHER): Payer: Medicare Other | Admitting: *Deleted

## 2016-06-10 DIAGNOSIS — I4891 Unspecified atrial fibrillation: Secondary | ICD-10-CM | POA: Diagnosis not present

## 2016-06-10 DIAGNOSIS — Z5181 Encounter for therapeutic drug level monitoring: Secondary | ICD-10-CM

## 2016-06-10 LAB — POCT INR: INR: 1.8

## 2016-06-19 ENCOUNTER — Telehealth: Payer: Self-pay | Admitting: Cardiovascular Disease

## 2016-06-19 DIAGNOSIS — R011 Cardiac murmur, unspecified: Secondary | ICD-10-CM

## 2016-06-19 NOTE — Telephone Encounter (Signed)
Per Ebony Hail @ Dr. Vickey Sages office, [name] heard a harsh 3 out of 6 systolic murmur that he has not heard prior to today, just wanted to make Dr. Bronson Ing aware of this.

## 2016-06-19 NOTE — Telephone Encounter (Signed)
Will forward to Dr. Koneswaran  

## 2016-06-22 NOTE — Telephone Encounter (Signed)
Let's repeat an echocardiogram and then schedule her to see me so I can review results and assess her.

## 2016-06-23 NOTE — Telephone Encounter (Signed)
Echo apt made for Thursday 5/31 at 1 pm  No answer or machine at home number, LM at contact person angel's number   Has apt already scheduled with Dr Bronson Ing early June

## 2016-06-24 ENCOUNTER — Other Ambulatory Visit (HOSPITAL_COMMUNITY): Payer: Medicare Other

## 2016-06-25 ENCOUNTER — Ambulatory Visit (HOSPITAL_COMMUNITY)
Admission: RE | Admit: 2016-06-25 | Discharge: 2016-06-25 | Disposition: A | Discharge status: Home / Self Care | Payer: Medicare Other | Source: Ambulatory Visit | Attending: Cardiovascular Disease | Admitting: Cardiovascular Disease

## 2016-06-25 DIAGNOSIS — I083 Combined rheumatic disorders of mitral, aortic and tricuspid valves: Secondary | ICD-10-CM | POA: Insufficient documentation

## 2016-06-25 DIAGNOSIS — I110 Hypertensive heart disease with heart failure: Secondary | ICD-10-CM | POA: Insufficient documentation

## 2016-06-25 DIAGNOSIS — I509 Heart failure, unspecified: Secondary | ICD-10-CM | POA: Diagnosis not present

## 2016-06-25 DIAGNOSIS — R011 Cardiac murmur, unspecified: Secondary | ICD-10-CM | POA: Diagnosis not present

## 2016-06-25 DIAGNOSIS — I4891 Unspecified atrial fibrillation: Secondary | ICD-10-CM | POA: Diagnosis not present

## 2016-06-25 NOTE — Progress Notes (Signed)
*  PRELIMINARY RESULTS* Echocardiogram 2D Echocardiogram has been performed.  Barbara Stone 06/25/2016, 2:14 PM

## 2016-06-29 ENCOUNTER — Telehealth: Payer: Self-pay

## 2016-06-29 DIAGNOSIS — Q231 Congenital insufficiency of aortic valve: Secondary | ICD-10-CM

## 2016-06-29 NOTE — Telephone Encounter (Addendum)
-----   Message from Laurine Blazer, LPN sent at 05/01/6597 10:14 AM EDT -----   ----- Message ----- From: Herminio Commons, MD Sent: 06/26/2016   9:34 AM To: Laurine Blazer, LPN  Murmur may be due to bicuspid aortic valve. Please obtain CTA aorta.    Order placed, patient aware, will have front desk schedule, pt has fu this week

## 2016-07-02 ENCOUNTER — Ambulatory Visit (INDEPENDENT_AMBULATORY_CARE_PROVIDER_SITE_OTHER): Payer: Medicare Other | Admitting: *Deleted

## 2016-07-02 ENCOUNTER — Ambulatory Visit (INDEPENDENT_AMBULATORY_CARE_PROVIDER_SITE_OTHER): Payer: Medicare Other | Admitting: Cardiovascular Disease

## 2016-07-02 ENCOUNTER — Encounter: Payer: Self-pay | Admitting: Cardiovascular Disease

## 2016-07-02 VITALS — BP 126/76 | HR 98 | Ht 66.0 in | Wt 192.0 lb

## 2016-07-02 DIAGNOSIS — Q231 Congenital insufficiency of aortic valve: Secondary | ICD-10-CM | POA: Diagnosis not present

## 2016-07-02 DIAGNOSIS — I4891 Unspecified atrial fibrillation: Secondary | ICD-10-CM

## 2016-07-02 DIAGNOSIS — Z5181 Encounter for therapeutic drug level monitoring: Secondary | ICD-10-CM

## 2016-07-02 DIAGNOSIS — I5022 Chronic systolic (congestive) heart failure: Secondary | ICD-10-CM | POA: Diagnosis not present

## 2016-07-02 DIAGNOSIS — I10 Essential (primary) hypertension: Secondary | ICD-10-CM | POA: Diagnosis not present

## 2016-07-02 DIAGNOSIS — I481 Persistent atrial fibrillation: Secondary | ICD-10-CM

## 2016-07-02 DIAGNOSIS — R011 Cardiac murmur, unspecified: Secondary | ICD-10-CM

## 2016-07-02 DIAGNOSIS — Q2381 Bicuspid aortic valve: Secondary | ICD-10-CM

## 2016-07-02 DIAGNOSIS — I4819 Other persistent atrial fibrillation: Secondary | ICD-10-CM

## 2016-07-02 LAB — POCT INR: INR: 3.1

## 2016-07-02 NOTE — Patient Instructions (Addendum)
Your physician recommends that you schedule a follow-up appointment in: 3 months (September) with Dr Bronson Ing    Your physician recommends that you continue on your current medications as directed. Please refer to the Current Medication list given to you today.    If you need a refill on your cardiac medications before your next appointment, please call your pharmacy.    No testing ordered today   Your INR today is : 3.1           Thank you for choosing [facility] !

## 2016-07-02 NOTE — Progress Notes (Signed)
SUBJECTIVE: The patient returns for follow-up after undergoing echocardiography after her PCP appreciated a loud murmur.  Echocardiogram demonstrated mildly reduced left ventricle systolic function with moderate LVH, LVEF 40-45%. Aortic valve leaflets were mildly thickened with possible bicuspid aortic valve. There is mild to moderate aortic regurgitation. There was no aortic stenosis. There is mild-to-moderate mitral regurgitation and moderate tricuspid regurgitation. Right-sided chambers were dilated.  I then ordered a CT angiogram of the aorta to evaluate for an aortopathy. This has yet to be performed.  She denies chest pain, palpitations, and shortness of breath. She denies leg swelling. She said "trying to stay off of salt is the hardest thing I've ever had to do in my life ".  Review of Systems: As per "subjective", otherwise negative.  No Known Allergies  Current Outpatient Prescriptions  Medication Sig Dispense Refill  . aspirin EC 81 MG tablet Take 81 mg by mouth daily.    . carvedilol (COREG) 12.5 MG tablet Take 1 tablet (12.5 mg total) by mouth 2 (two) times daily. 60 tablet 2  . furosemide (LASIX) 40 MG tablet Take 1 tablet (40 mg total) by mouth daily. 30 tablet 2  . metFORMIN (GLUCOPHAGE) 500 MG tablet Take 1,000 mg by mouth 2 (two) times daily with a meal.    . potassium chloride SA (K-DUR,KLOR-CON) 20 MEQ tablet Take 1 tablet (20 mEq total) by mouth daily. 30 tablet 2  . warfarin (COUMADIN) 5 MG tablet Take 1 tablet daily except 1 1/2 tablets on Mondays and Thursdays 40 tablet 3   No current facility-administered medications for this visit.     Past Medical History:  Diagnosis Date  . Anxiety   . Diabetes mellitus without complication ([place])   . Hypertension     Past Surgical History:  Procedure Laterality Date  . BACK SURGERY    . EYE SURGERY      Social History   Social History  . Marital status: Widowed    Spouse name: N/A  . Number of children:  N/A  . Years of education: N/A   Occupational History  . Not on file.   Social History Main Topics  . Smoking status: Never Smoker  . Smokeless tobacco: Never Used  . Alcohol use No  . Drug use: No  . Sexual activity: Not on file   Other Topics Concern  . Not on file   Social History Narrative  . No narrative on file     Vitals:   07/02/16 0957  BP: 126/76  Pulse: 98  SpO2: 91%  Weight: 192 lb (87.1 kg)  Height: 5\' 6"  (1.676 m)    Wt Readings from Last 3 Encounters:  07/02/16 192 lb (87.1 kg)  04/01/16 201 lb (91.2 kg)  03/22/16 207 lb 14.4 oz (94.3 kg)     PHYSICAL EXAM General: NAD HEENT: Normal. Neck: No JVD, no thyromegaly. Lungs: Clear to auscultation bilaterally with normal respiratory effort. CV: Nondisplaced PMI.  Regular rate and irregular rhythm, normal S1/S2, no S3, 2/6 systolic murmur over right upper sternal border, left upper and lower sternal borders. No pretibial or periankle edema.     Abdomen: Soft, nontender, no distention.  Neurologic: Alert and oriented.  Psych: Normal affect. Skin: Normal. Musculoskeletal: No gross deformities.    ECG: Most recent ECG reviewed.   Labs: Lab Results  Component Value Date/Time   K 3.5 03/22/2016 06:13 AM   BUN 28 (H) 03/22/2016 06:13 AM   CREATININE 1.42 (H) 03/22/2016  06:13 AM   ALT 17 03/19/2016 06:00 AM   HGB 10.5 (L) 03/19/2016 05:52 AM     Lipids: No results found for: LDLCALC, LDLDIRECT, CHOL, TRIG, HDL     ASSESSMENT AND PLAN: 1. Murmur: Multifactorial and due to possible bicuspid aortic valve, mild to moderate mitral regurgitation, and moderate tricuspid regurgitation. No evidence of aortic stenosis. I will await CT angiogram of the aorta to evaluate for an aortopathy.  2. Chronic systolic heart failure, EF 45-50%: Compensated on Lasix 40 mg daily and carvedilol. No ACE inhibitor's or ARB's due to chronic disease stage III.  Previously educated on the importance of a low sodium diet.  She says she has changed her diet and while it is difficult she is adhering to it.  3. Persistent atrial fibrillation: Heart rate is controlled on carvedilol 12.5 mg twice daily. Anticoagulated with warfarin. No changes.  4. Hypertension: Controlled. No changes.    Disposition: Follow up 3 months.  Kate Sable, M.D., F.A.C.C.

## 2016-07-09 ENCOUNTER — Encounter (INDEPENDENT_AMBULATORY_CARE_PROVIDER_SITE_OTHER): Payer: Medicare Other | Admitting: Ophthalmology

## 2016-07-13 ENCOUNTER — Encounter (INDEPENDENT_AMBULATORY_CARE_PROVIDER_SITE_OTHER): Payer: Medicare Other | Admitting: Ophthalmology

## 2016-07-13 DIAGNOSIS — E113512 Type 2 diabetes mellitus with proliferative diabetic retinopathy with macular edema, left eye: Secondary | ICD-10-CM

## 2016-07-13 DIAGNOSIS — H43811 Vitreous degeneration, right eye: Secondary | ICD-10-CM

## 2016-07-13 DIAGNOSIS — E113591 Type 2 diabetes mellitus with proliferative diabetic retinopathy without macular edema, right eye: Secondary | ICD-10-CM

## 2016-07-13 DIAGNOSIS — E11311 Type 2 diabetes mellitus with unspecified diabetic retinopathy with macular edema: Secondary | ICD-10-CM | POA: Diagnosis not present

## 2016-07-13 DIAGNOSIS — H35372 Puckering of macula, left eye: Secondary | ICD-10-CM | POA: Diagnosis not present

## 2016-07-13 DIAGNOSIS — I10 Essential (primary) hypertension: Secondary | ICD-10-CM | POA: Diagnosis not present

## 2016-07-13 DIAGNOSIS — H35033 Hypertensive retinopathy, bilateral: Secondary | ICD-10-CM

## 2016-07-14 ENCOUNTER — Ambulatory Visit (HOSPITAL_COMMUNITY): Payer: Medicare Other

## 2016-07-24 ENCOUNTER — Ambulatory Visit (HOSPITAL_COMMUNITY)
Admission: RE | Admit: 2016-07-24 | Discharge: 2016-07-24 | Disposition: A | Discharge status: Home / Self Care | Payer: Medicare Other | Source: Ambulatory Visit | Attending: Cardiovascular Disease | Admitting: Cardiovascular Disease

## 2016-07-24 ENCOUNTER — Encounter (HOSPITAL_COMMUNITY): Payer: Self-pay

## 2016-07-24 ENCOUNTER — Telehealth: Payer: Self-pay

## 2016-07-24 DIAGNOSIS — Z79899 Other long term (current) drug therapy: Secondary | ICD-10-CM

## 2016-07-24 DIAGNOSIS — I700 Atherosclerosis of aorta: Secondary | ICD-10-CM | POA: Insufficient documentation

## 2016-07-24 DIAGNOSIS — I719 Aortic aneurysm of unspecified site, without rupture: Secondary | ICD-10-CM | POA: Diagnosis not present

## 2016-07-24 DIAGNOSIS — Q231 Congenital insufficiency of aortic valve: Secondary | ICD-10-CM | POA: Insufficient documentation

## 2016-07-24 HISTORY — DX: Heart failure, unspecified: I50.9

## 2016-07-24 LAB — POCT I-STAT CREATININE: Creatinine, Ser: 1.7 mg/dL — ABNORMAL HIGH (ref 0.44–1.00)

## 2016-07-24 MED ORDER — FUROSEMIDE 40 MG PO TABS: ORAL_TABLET | ORAL | 3 refills | Status: DC

## 2016-07-24 MED ORDER — IOPAMIDOL (ISOVUE-370) INJECTION 76%
80.0000 mL | Freq: Once | INTRAVENOUS | Status: AC | PRN
  Administered 2016-07-24: 80 mL via INTRAVENOUS

## 2016-07-24 NOTE — Telephone Encounter (Signed)
-----   Message from Laurine Blazer, LPN sent at 9/84/7308 10:10 AM EDT -----   ----- Message ----- From: Herminio Commons, MD Sent: 07/24/2016   9:58 AM To: Laurine Blazer, LPN  Try alternating Lasix 40 mg and 20 mg on alternate days. Repeat BMET 2 weeks later and reassess symptoms.

## 2016-07-24 NOTE — Telephone Encounter (Signed)
Pt understands to take lasix 40 mg and alternate with 20 mg every other day.i mailed her lab slip for repeat bmet in 2 weeks

## 2016-08-03 ENCOUNTER — Ambulatory Visit (INDEPENDENT_AMBULATORY_CARE_PROVIDER_SITE_OTHER): Payer: Medicare Other | Admitting: *Deleted

## 2016-08-03 DIAGNOSIS — Z5181 Encounter for therapeutic drug level monitoring: Secondary | ICD-10-CM | POA: Diagnosis not present

## 2016-08-03 DIAGNOSIS — I4891 Unspecified atrial fibrillation: Secondary | ICD-10-CM | POA: Diagnosis not present

## 2016-08-03 LAB — POCT INR: INR: 3.5

## 2016-08-17 ENCOUNTER — Telehealth: Payer: Self-pay | Admitting: *Deleted

## 2016-08-17 NOTE — Telephone Encounter (Signed)
Please give pt a call  °

## 2016-08-17 NOTE — Telephone Encounter (Signed)
Pt called to verify appt time.  She thought it was today but appt is 7/30 at 9:45am.  She verbalized understanding.

## 2016-08-24 ENCOUNTER — Ambulatory Visit (INDEPENDENT_AMBULATORY_CARE_PROVIDER_SITE_OTHER): Payer: Medicare Other | Admitting: *Deleted

## 2016-08-24 DIAGNOSIS — Z5181 Encounter for therapeutic drug level monitoring: Secondary | ICD-10-CM | POA: Diagnosis not present

## 2016-08-24 DIAGNOSIS — I4891 Unspecified atrial fibrillation: Secondary | ICD-10-CM | POA: Diagnosis not present

## 2016-08-24 LAB — POCT INR: INR: 2.7

## 2016-09-15 ENCOUNTER — Other Ambulatory Visit: Payer: Self-pay | Admitting: Cardiovascular Disease

## 2016-09-15 MED ORDER — WARFARIN SODIUM 5 MG PO TABS: ORAL_TABLET | ORAL | 3 refills | Status: DC

## 2016-09-15 NOTE — Telephone Encounter (Signed)
Refilled coumadin per request

## 2016-09-15 NOTE — Telephone Encounter (Signed)
Patient needs refill on Warfarin sent to pharmacy / tg

## 2016-09-16 ENCOUNTER — Other Ambulatory Visit: Payer: Self-pay | Admitting: *Deleted

## 2016-09-16 NOTE — Telephone Encounter (Signed)
Needed coumadin refill.  It was already sent in yesterday.

## 2016-09-21 ENCOUNTER — Encounter (INDEPENDENT_AMBULATORY_CARE_PROVIDER_SITE_OTHER): Payer: Medicare Other | Admitting: Ophthalmology

## 2016-09-21 DIAGNOSIS — E113591 Type 2 diabetes mellitus with proliferative diabetic retinopathy without macular edema, right eye: Secondary | ICD-10-CM

## 2016-09-21 DIAGNOSIS — H2511 Age-related nuclear cataract, right eye: Secondary | ICD-10-CM | POA: Diagnosis not present

## 2016-09-21 DIAGNOSIS — I10 Essential (primary) hypertension: Secondary | ICD-10-CM

## 2016-09-21 DIAGNOSIS — H43811 Vitreous degeneration, right eye: Secondary | ICD-10-CM | POA: Diagnosis not present

## 2016-09-21 DIAGNOSIS — E113512 Type 2 diabetes mellitus with proliferative diabetic retinopathy with macular edema, left eye: Secondary | ICD-10-CM | POA: Diagnosis not present

## 2016-09-21 DIAGNOSIS — E11311 Type 2 diabetes mellitus with unspecified diabetic retinopathy with macular edema: Secondary | ICD-10-CM

## 2016-09-21 DIAGNOSIS — H35033 Hypertensive retinopathy, bilateral: Secondary | ICD-10-CM

## 2016-09-22 ENCOUNTER — Telehealth: Payer: Self-pay | Admitting: Cardiovascular Disease

## 2016-09-22 NOTE — Telephone Encounter (Signed)
Can take an extra 40 mg of Lasix. Would continue to monitor daily weights and recommend leg elevation.

## 2016-09-22 NOTE — Telephone Encounter (Signed)
Pt made aware, she voiced understanding. She will check daily weights and keep [place] updated. She also agrees to elevate her legs more often.

## 2016-09-22 NOTE — Telephone Encounter (Signed)
Tried to call pt multiple times. Phone is busy. I will try again later.

## 2016-09-22 NOTE — Telephone Encounter (Signed)
Returned pt call. She states that she is starting to have some swelling in her feet. She has been eating a small amount of salt, but not a lot. (She has been eating her vegetables mostly) She stated that she does elevate them occasionally in the evening, but not all the time. She stated that she does not have any SOB or weight gain. Please advise.

## 2016-09-22 NOTE — Telephone Encounter (Signed)
Patient states she is gaining fluid in her feet / tg

## 2016-09-23 ENCOUNTER — Ambulatory Visit (INDEPENDENT_AMBULATORY_CARE_PROVIDER_SITE_OTHER): Payer: Medicare Other | Admitting: *Deleted

## 2016-09-23 DIAGNOSIS — I4891 Unspecified atrial fibrillation: Secondary | ICD-10-CM

## 2016-09-23 DIAGNOSIS — Z5181 Encounter for therapeutic drug level monitoring: Secondary | ICD-10-CM | POA: Diagnosis not present

## 2016-09-23 LAB — POCT INR: INR: 3.5

## 2016-10-13 ENCOUNTER — Ambulatory Visit (INDEPENDENT_AMBULATORY_CARE_PROVIDER_SITE_OTHER): Payer: Medicare Other | Admitting: *Deleted

## 2016-10-13 ENCOUNTER — Encounter: Payer: Self-pay | Admitting: Cardiovascular Disease

## 2016-10-13 ENCOUNTER — Ambulatory Visit (INDEPENDENT_AMBULATORY_CARE_PROVIDER_SITE_OTHER): Payer: Medicare Other | Admitting: Cardiovascular Disease

## 2016-10-13 VITALS — BP 122/78 | HR 74 | Ht 66.0 in | Wt 200.0 lb

## 2016-10-13 DIAGNOSIS — I38 Endocarditis, valve unspecified: Secondary | ICD-10-CM

## 2016-10-13 DIAGNOSIS — Q231 Congenital insufficiency of aortic valve: Secondary | ICD-10-CM

## 2016-10-13 DIAGNOSIS — I5022 Chronic systolic (congestive) heart failure: Secondary | ICD-10-CM

## 2016-10-13 DIAGNOSIS — I4891 Unspecified atrial fibrillation: Secondary | ICD-10-CM | POA: Diagnosis not present

## 2016-10-13 DIAGNOSIS — I4819 Other persistent atrial fibrillation: Secondary | ICD-10-CM

## 2016-10-13 DIAGNOSIS — Z5181 Encounter for therapeutic drug level monitoring: Secondary | ICD-10-CM

## 2016-10-13 DIAGNOSIS — Z713 Dietary counseling and surveillance: Secondary | ICD-10-CM

## 2016-10-13 DIAGNOSIS — I481 Persistent atrial fibrillation: Secondary | ICD-10-CM

## 2016-10-13 DIAGNOSIS — I10 Essential (primary) hypertension: Secondary | ICD-10-CM

## 2016-10-13 LAB — POCT INR: INR: 3.7

## 2016-10-13 NOTE — Patient Instructions (Addendum)
Your physician wants you to follow-up in: 6 months with Dr.koneswaran You will receive a reminder letter in the mail two months in advance. If you don't receive a letter, please call our office to schedule the follow-up appointment.   Your physician recommends that you continue on your current medications as directed. Please refer to the Current Medication list given to you today.    If you need a refill on your cardiac medications before your next appointment, please call your pharmacy.   Your INR is 3.7 Per Edrick Oh, HOLD Coumadin TONIGHT, then reduce Coumadin to 5 mg every day, EXCEPT take 2.5 mg on Tuesday and Friday  Follow up with Lattie Haw in 3 weeks   No lab work or tests ordered today.      Thank you for choosing [facility] !

## 2016-10-13 NOTE — Progress Notes (Signed)
SUBJECTIVE: The patient presents for follow-up of chronic systolic heart failure and valvular heart disease.  Echocardiogram on 06/25/16 demonstrated mildly reduced left ventricle systolic function with moderate LVH, LVEF 40-45%. Aortic valve leaflets were mildly thickened with possible bicuspid aortic valve. There is mild to moderate aortic regurgitation. There was no aortic stenosis. There is mild-to-moderate mitral regurgitation and moderate tricuspid regurgitation. Right-sided chambers were dilated.  CT angiography of the chest and aorta on 07/24/16 demonstrated dilatation of the ascending aorta to 4 cm. Of note, no significant coronary calcifications were identified.  She has had problems with sodium restriction in the past.  She denies chest pain and shortness of breath. Her leg swelling is well controlled. She again asked me if it is okay to use salt. She again said multiple times, "Quitting salt is the hardest thing I've ever had to do".   Review of Systems: As per "subjective", otherwise negative.  No Known Allergies  Current Outpatient Prescriptions  Medication Sig Dispense Refill  . aspirin EC 81 MG tablet Take 81 mg by mouth daily.    . carvedilol (COREG) 12.5 MG tablet Take 1 tablet (12.5 mg total) by mouth 2 (two) times daily. 60 tablet 2  . furosemide (LASIX) 40 MG tablet Take lasix 40 mg and 20 mg on alternate days 90 tablet 3  . metFORMIN (GLUCOPHAGE) 500 MG tablet Take 1,000 mg by mouth 2 (two) times daily with a meal.    . potassium chloride SA (K-DUR,KLOR-CON) 20 MEQ tablet Take 1 tablet (20 mEq total) by mouth daily. 30 tablet 2  . warfarin (COUMADIN) 5 MG tablet Take 1 tablet daily except 1 1/2 tablets on Mondays and Thursdays 40 tablet 3   No current facility-administered medications for this visit.     Past Medical History:  Diagnosis Date  . Anxiety   . CHF (congestive heart failure) ([place])   . Diabetes mellitus without complication ([place])   .  Hypertension     Past Surgical History:  Procedure Laterality Date  . BACK SURGERY    . EYE SURGERY      Social History   Social History  . Marital status: Widowed    Spouse name: N/A  . Number of children: N/A  . Years of education: N/A   Occupational History  . Not on file.   Social History Main Topics  . Smoking status: Never Smoker  . Smokeless tobacco: Never Used  . Alcohol use No  . Drug use: No  . Sexual activity: Not on file   Other Topics Concern  . Not on file   Social History Narrative  . No narrative on file     Vitals:   10/13/16 1034  BP: 122/78  Pulse: 74  SpO2: 99%  Weight: 200 lb (90.7 kg)  Height: 5\' 6"  (1.676 m)    Wt Readings from Last 3 Encounters:  10/13/16 200 lb (90.7 kg)  07/02/16 192 lb (87.1 kg)  04/01/16 201 lb (91.2 kg)     PHYSICAL EXAM General: NAD HEENT: Normal. Neck: No JVD, no thyromegaly. Lungs: Clear to auscultation bilaterally with normal respiratory effort. CV: Nondisplaced PMI.  Regular rate and rhythm, normal S1/S2, no H8/I6, 2/6 systolic murmur over right upper sternal border, left upper and lower sternal borders. Trace bilateral periankle edema.  .   Abdomen: Soft, nontender, no distention.  Neurologic: Alert and oriented.  Psych: Normal affect. Skin: Normal. Musculoskeletal: No gross deformities.    ECG: Most recent ECG  reviewed.   Labs: Lab Results  Component Value Date/Time   K 3.5 03/22/2016 06:13 AM   BUN 28 (H) 03/22/2016 06:13 AM   CREATININE 1.70 (H) 07/24/2016 09:14 AM   ALT 17 03/19/2016 06:00 AM   HGB 10.5 (L) 03/19/2016 05:52 AM     Lipids: No results found for: LDLCALC, LDLDIRECT, CHOL, TRIG, HDL     ASSESSMENT AND PLAN:  1. Valvular heart disease: Symptomatically stable. Multifactorial and due to possible bicuspid aortic valve, mild to moderate mitral regurgitation, and moderate tricuspid regurgitation. No evidence of aortic stenosis. Aortic root dilatation is mild, 4 cm.  2.  Chronic systolic heart failure, EF 45-50%:Compensated on Lasix 40 mg daily and carvedilol. No ACE inhibitor's or ARB's due to chronic disease stage III.  I again educated her on the importance of a low sodium diet. She says she has changed her diet and while it is difficult, she is adhering to it.  3. Persistent atrial fibrillation:Heart rate is controlled on carvedilol 12.5 mg twice daily. Anticoagulated with warfarin. No changes.  4. Hypertension: Controlled. No changes.  5. Aortic root dilatation: Aortic root dilatation is mild, 4 cm (CT detailed above). I will monitor.      Disposition: Follow up 6 months.   Kate Sable, M.D., F.A.C.C.

## 2016-10-14 ENCOUNTER — Telehealth: Payer: Self-pay | Admitting: *Deleted

## 2016-10-14 NOTE — Telephone Encounter (Signed)
Pt needed clarification on coumadin dosage.  Explained to pt and she verbalized understanding.

## 2016-10-14 NOTE — Telephone Encounter (Signed)
Please give pt a call-- she has a question for you.  °

## 2016-11-02 ENCOUNTER — Ambulatory Visit (INDEPENDENT_AMBULATORY_CARE_PROVIDER_SITE_OTHER): Payer: Medicare Other | Admitting: *Deleted

## 2016-11-02 DIAGNOSIS — Z5181 Encounter for therapeutic drug level monitoring: Secondary | ICD-10-CM

## 2016-11-02 DIAGNOSIS — I4891 Unspecified atrial fibrillation: Secondary | ICD-10-CM | POA: Diagnosis not present

## 2016-11-02 LAB — POCT INR: INR: 2.2

## 2016-11-13 ENCOUNTER — Other Ambulatory Visit (HOSPITAL_COMMUNITY): Payer: Self-pay | Admitting: Family Medicine

## 2016-11-13 DIAGNOSIS — L03012 Cellulitis of left finger: Secondary | ICD-10-CM

## 2016-11-14 ENCOUNTER — Ambulatory Visit (HOSPITAL_COMMUNITY)
Admission: RE | Admit: 2016-11-14 | Discharge: 2016-11-14 | Disposition: A | Discharge status: Home / Self Care | Payer: Medicare Other | Source: Ambulatory Visit | Attending: Family Medicine | Admitting: Family Medicine

## 2016-11-14 DIAGNOSIS — L03012 Cellulitis of left finger: Secondary | ICD-10-CM | POA: Diagnosis present

## 2016-11-30 ENCOUNTER — Encounter (INDEPENDENT_AMBULATORY_CARE_PROVIDER_SITE_OTHER): Payer: Medicare Other | Admitting: Ophthalmology

## 2016-12-02 ENCOUNTER — Ambulatory Visit (INDEPENDENT_AMBULATORY_CARE_PROVIDER_SITE_OTHER): Payer: Medicare Other | Admitting: *Deleted

## 2016-12-02 DIAGNOSIS — Z5181 Encounter for therapeutic drug level monitoring: Secondary | ICD-10-CM

## 2016-12-02 DIAGNOSIS — I4891 Unspecified atrial fibrillation: Secondary | ICD-10-CM

## 2016-12-02 LAB — POCT INR: INR: 2.1

## 2016-12-14 ENCOUNTER — Telehealth: Payer: Self-pay

## 2016-12-14 ENCOUNTER — Ambulatory Visit (INDEPENDENT_AMBULATORY_CARE_PROVIDER_SITE_OTHER): Payer: Medicare Other | Admitting: Gastroenterology

## 2016-12-14 ENCOUNTER — Encounter: Payer: Self-pay | Admitting: Gastroenterology

## 2016-12-14 DIAGNOSIS — D508 Other iron deficiency anemias: Secondary | ICD-10-CM

## 2016-12-14 DIAGNOSIS — D509 Iron deficiency anemia, unspecified: Secondary | ICD-10-CM | POA: Insufficient documentation

## 2016-12-14 LAB — CBC WITH DIFFERENTIAL/PLATELET
BASOS PCT: 0.4 %
Basophils Absolute: 20 cells/uL (ref 0–200)
EOS ABS: 122 {cells}/uL (ref 15–500)
Eosinophils Relative: 2.4 %
HCT: 30.3 % — ABNORMAL LOW (ref 35.0–45.0)
Hemoglobin: 9.1 g/dL — ABNORMAL LOW (ref 11.7–15.5)
Lymphs Abs: 877 cells/uL (ref 850–3900)
MCH: 26.2 pg — AB (ref 27.0–33.0)
MCHC: 30 g/dL — ABNORMAL LOW (ref 32.0–36.0)
MCV: 87.3 fL (ref 80.0–100.0)
MPV: 11.2 fL (ref 7.5–12.5)
Monocytes Relative: 8.7 %
NEUTROS PCT: 71.3 %
Neutro Abs: 3636 cells/uL (ref 1500–7800)
PLATELETS: 252 10*3/uL (ref 140–400)
RBC: 3.47 10*6/uL — ABNORMAL LOW (ref 3.80–5.10)
RDW: 19.9 % — AB (ref 11.0–15.0)
TOTAL LYMPHOCYTE: 17.2 %
WBC: 5.1 10*3/uL (ref 3.8–10.8)
WBCMIX: 444 {cells}/uL (ref 200–950)

## 2016-12-14 NOTE — Telephone Encounter (Signed)
Edrick Oh,  Roseanne Kaufman NP wants to schedule a colonoscopy for pt. Is it ok that she holds her Coumadin for 3 days prior to the colonoscopy? Please advise.

## 2016-12-14 NOTE — Telephone Encounter (Signed)
OK to hold coumadin 3 days before colonoscopy.  Resume coumadin night of procedure if OK with MD.

## 2016-12-14 NOTE — Progress Notes (Signed)
Primary Care Physician:  Lemmie Evens, MD Primary Gastroenterologist:  Dr. Gala Romney   Chief Complaint  Patient presents with  . Anemia    HPI:   Barbara Stone is a 81 y.o. female presenting today at the request of [name] secondary to new onset IDA. A year ago, her Hgb was in the 10/11 range. September 2018 Hgb was 8.4. Most recently at end of October, her Hgb was 7.6. She has evidence of IDA with iron low at 28 and ferritin low normal at 27. She is here today with her daughter.    No melena. No hematochezia. Takes pepto bismol and this will turn her stool black, otherwise her stool is not dark. She was heme negative with [name]. States she gets "a little weak at times". Appetite is not the best as she can't add salt to her food. States food without salt is not good. Doesn't eat that much. No abdominal pain. No nausea or vomiting. No dysphagia. No GERD symptoms. No weight loss. Had been taking Naproxen at some point but is not on this any longer. Daughter is present and verifies this. She believes she may be taking oral iron. She is on Coumadin secondary to afib. She has never had a colonoscopy or upper endoscopy, and she is not sure that she wants to pursue this. She would like to think about this with her family but willing to update blood work today.   Past Medical History:  Diagnosis Date  . Anxiety   . Atrial fibrillation ([place])   . CHF (congestive heart failure) ([place])   . Diabetes mellitus without complication ([place])   . Hypertension     Past Surgical History:  Procedure Laterality Date  . BACK SURGERY    . EYE SURGERY Left     Current Outpatient Medications  Medication Sig Dispense Refill  . aspirin EC 81 MG tablet Take 81 mg by mouth daily.    . carvedilol (COREG) 12.5 MG tablet Take 1 tablet (12.5 mg total) by mouth 2 (two) times daily. 60 tablet 2  . furosemide (LASIX) 40 MG tablet Take lasix 40 mg and 20 mg on alternate days 90 tablet 3  . metFORMIN  (GLUCOPHAGE) 500 MG tablet Take 1,000 mg by mouth 2 (two) times daily with a meal.    . potassium chloride SA (K-DUR,KLOR-CON) 20 MEQ tablet Take 1 tablet (20 mEq total) by mouth daily. 30 tablet 2  . warfarin (COUMADIN) 5 MG tablet Take 1 tablet daily except 1 1/2 tablets on Mondays and Thursdays 40 tablet 3   No current facility-administered medications for this visit.     Allergies as of 12/14/2016  . (No Known Allergies)    Family History  Problem Relation Age of Onset  . Colon cancer Neg Hx     Social History   Socioeconomic History  . Marital status: Widowed    Spouse name: Not on file  . Number of children: Not on file  . Years of education: Not on file  . Highest education level: Not on file  Social Needs  . Financial resource strain: Not on file  . Food insecurity - worry: Not on file  . Food insecurity - inability: Not on file  . Transportation needs - medical: Not on file  . Transportation needs - non-medical: Not on file  Occupational History  . Not on file  Tobacco Use  . Smoking status: Never Smoker  . Smokeless tobacco: Never Used  Substance  and Sexual Activity  . Alcohol use: No  . Drug use: No  . Sexual activity: Not on file  Other Topics Concern  . Not on file  Social History Narrative   Brother-in-law lives with patient.     Review of Systems: Gen: see HPI  CV: Denies chest pain, heart palpitations, peripheral edema, syncope.  Resp: Denies shortness of breath at rest or with exertion. Denies wheezing or cough.  GI: see HPI  GU : Denies urinary burning, urinary frequency, urinary hesitancy MS: Denies joint pain, muscle weakness, cramps, or limitation of movement.  Derm: Denies rash, itching, dry skin Psych: Denies depression, anxiety, memory loss, and confusion Heme: Denies bruising, bleeding, and enlarged lymph nodes.  Physical Exam: BP (!) 144/83   Pulse 82   Temp 97.9 F (36.6 C) (Oral)   Ht 5\' 6"  (1.676 m)   Wt 204 lb 12.8 oz (92.9  kg)   BMI 33.06 kg/m  General:   Alert and oriented. Pleasant and cooperative. Well-nourished and well-developed.  Head:  Normocephalic and atraumatic. Eyes:  Without icterus, sclera clear and conjunctiva pink.  Ears:  Normal auditory acuity. Nose:  No deformity, discharge,  or lesions. Mouth:  No deformity or lesions, oral mucosa pink.  Lungs:  Clear to auscultation bilaterally. No wheezes, rales, or rhonchi. No distress.  Heart:  S1, S2 present, irregularly irregular, 2/6 systolic murmur.  Abdomen:  +BS, soft, non-tender and non-distended. No HSM noted. No guarding or rebound. No masses appreciated.  Rectal:  Deferred  Msk:  Symmetrical without gross deformities. Normal posture. Extremities:  With pedal and ankle edema. Neurologic:  Alert and  oriented x4 Psych:  Alert and cooperative. Normal mood and affect.

## 2016-12-14 NOTE — Telephone Encounter (Signed)
Don't schedule procedures yet. She is thinking about it. I just wanted to make sure we covered the Coumadin issue first.

## 2016-12-14 NOTE — Telephone Encounter (Signed)
noted 

## 2016-12-14 NOTE — Telephone Encounter (Signed)
Routing to Mindy to Schedule TCS

## 2016-12-14 NOTE — Patient Instructions (Signed)
Please have blood work done today.  I am recommending a colonoscopy and upper endoscopy. Please talk about this with your family.   We will call today or tomorrow with results of blood test! You may need a transfusion if it is too low.  Have a wonderful Thanksgiving!  It was a pleasure to see you today. I strive to create trusting relationships with patients to provide genuine, compassionate, and quality care. I value your feedback. If you receive a survey regarding your visit,  I greatly appreciate you the taking time to fill this out.   Annitta Needs, PhD, ANP-BC [facility]

## 2016-12-15 ENCOUNTER — Telehealth: Payer: Self-pay | Admitting: Cardiovascular Disease

## 2016-12-15 NOTE — Progress Notes (Signed)
Please let patient know that her Hgb is better than it was several weeks ago (it had been in the 7 range at PCP office). It is now 9.1. Has she decided about the procedures? She does not need any blood transfusion right now.

## 2016-12-15 NOTE — Telephone Encounter (Signed)
Per phone call pt is having fluid in her foot

## 2016-12-15 NOTE — Assessment & Plan Note (Signed)
81 year old female with IDA, Hgb drifting over past year and most recently 7.6 at the end of October, ferritin low normal at 27, and iron low at 28. No overt GI bleeding noted. Heme negative on file through PCP. Occasional weakness. Chronic anticoagulation (Coumadin) secondary to history of afib. Discussed updating CBC today and potential for transfusion if continues to drop. Also discussed recommendation for colonoscopy/EGD, but she would like to discuss this with her family first. As of note, she has never had any prior endoscopic evaluation. Will check CBC today and await her decision.   Discussed with cardiology, and she may hold Coumadin X 3 days prior. Will also hold iron X 7 days prior.

## 2016-12-15 NOTE — Progress Notes (Signed)
I 100% recommend this. It is up to her if she would like to proceed, but this is what is medically recommended due to clinical scenario.

## 2016-12-15 NOTE — Progress Notes (Signed)
cc'ed to pcp °

## 2016-12-15 NOTE — Telephone Encounter (Signed)
Returned pt call. She stated she is having just a "slight bit of fluid" retaining in her left foot. She stated she has called her pcp and was advised to increase her fluid pill for 2 days and call back on Friday if it is no better. I also told her to elevate her legs when sitting. She voiced understanding.

## 2016-12-30 NOTE — Telephone Encounter (Signed)
Noted. Would recommend rechecking CBC in 4 weeks. Offer office visit in January to follow-up.

## 2016-12-30 NOTE — Telephone Encounter (Signed)
Spoke with pt and she reports she discussed this with her family and she wants to hold off on the procedures for now. Sending as an Pharmacist, hospital to [employer]

## 2016-12-30 NOTE — Telephone Encounter (Signed)
ATC pt, line rang numerous times, no answer, no VM.

## 2016-12-31 NOTE — Telephone Encounter (Signed)
LM w/ female that answered the phone

## 2017-01-06 ENCOUNTER — Encounter: Payer: Self-pay | Admitting: *Deleted

## 2017-01-06 NOTE — Telephone Encounter (Signed)
Called pt, NA, no VM. Letter mailed to contact office.

## 2017-01-11 ENCOUNTER — Encounter (INDEPENDENT_AMBULATORY_CARE_PROVIDER_SITE_OTHER): Payer: Medicare Other | Admitting: Ophthalmology

## 2017-01-11 DIAGNOSIS — I10 Essential (primary) hypertension: Secondary | ICD-10-CM | POA: Diagnosis not present

## 2017-01-11 DIAGNOSIS — E113512 Type 2 diabetes mellitus with proliferative diabetic retinopathy with macular edema, left eye: Secondary | ICD-10-CM

## 2017-01-11 DIAGNOSIS — H35372 Puckering of macula, left eye: Secondary | ICD-10-CM

## 2017-01-11 DIAGNOSIS — E113591 Type 2 diabetes mellitus with proliferative diabetic retinopathy without macular edema, right eye: Secondary | ICD-10-CM | POA: Diagnosis not present

## 2017-01-11 DIAGNOSIS — H43811 Vitreous degeneration, right eye: Secondary | ICD-10-CM | POA: Diagnosis not present

## 2017-01-11 DIAGNOSIS — H2511 Age-related nuclear cataract, right eye: Secondary | ICD-10-CM

## 2017-01-11 DIAGNOSIS — E11311 Type 2 diabetes mellitus with unspecified diabetic retinopathy with macular edema: Secondary | ICD-10-CM | POA: Diagnosis not present

## 2017-01-11 DIAGNOSIS — H35033 Hypertensive retinopathy, bilateral: Secondary | ICD-10-CM

## 2017-01-27 ENCOUNTER — Encounter (HOSPITAL_COMMUNITY): Payer: Self-pay | Admitting: Cardiology

## 2017-01-27 ENCOUNTER — Emergency Department (HOSPITAL_COMMUNITY)
Admission: EM | Admit: 2017-01-27 | Discharge: 2017-01-27 | Disposition: A | Discharge status: Home / Self Care | Payer: Medicare Other | Attending: Emergency Medicine | Admitting: Emergency Medicine

## 2017-01-27 ENCOUNTER — Telehealth: Payer: Self-pay | Admitting: Cardiovascular Disease

## 2017-01-27 DIAGNOSIS — Z7901 Long term (current) use of anticoagulants: Secondary | ICD-10-CM | POA: Insufficient documentation

## 2017-01-27 DIAGNOSIS — I5021 Acute systolic (congestive) heart failure: Secondary | ICD-10-CM | POA: Diagnosis not present

## 2017-01-27 DIAGNOSIS — R2241 Localized swelling, mass and lump, right lower limb: Secondary | ICD-10-CM | POA: Diagnosis not present

## 2017-01-27 DIAGNOSIS — E1122 Type 2 diabetes mellitus with diabetic chronic kidney disease: Secondary | ICD-10-CM | POA: Diagnosis not present

## 2017-01-27 DIAGNOSIS — N183 Chronic kidney disease, stage 3 (moderate): Secondary | ICD-10-CM | POA: Insufficient documentation

## 2017-01-27 DIAGNOSIS — Z7984 Long term (current) use of oral hypoglycemic drugs: Secondary | ICD-10-CM | POA: Diagnosis not present

## 2017-01-27 DIAGNOSIS — R739 Hyperglycemia, unspecified: Secondary | ICD-10-CM | POA: Diagnosis not present

## 2017-01-27 DIAGNOSIS — I130 Hypertensive heart and chronic kidney disease with heart failure and stage 1 through stage 4 chronic kidney disease, or unspecified chronic kidney disease: Secondary | ICD-10-CM | POA: Diagnosis not present

## 2017-01-27 DIAGNOSIS — Z7982 Long term (current) use of aspirin: Secondary | ICD-10-CM | POA: Diagnosis not present

## 2017-01-27 DIAGNOSIS — R2242 Localized swelling, mass and lump, left lower limb: Secondary | ICD-10-CM | POA: Diagnosis present

## 2017-01-27 DIAGNOSIS — I509 Heart failure, unspecified: Secondary | ICD-10-CM

## 2017-01-27 LAB — PROTIME-INR
INR: 2.19
Prothrombin Time: 24.1 seconds — ABNORMAL HIGH (ref 11.4–15.2)

## 2017-01-27 LAB — BASIC METABOLIC PANEL
ANION GAP: 11 (ref 5–15)
BUN: 49 mg/dL — ABNORMAL HIGH (ref 6–20)
CO2: 26 mmol/L (ref 22–32)
Calcium: 8.6 mg/dL — ABNORMAL LOW (ref 8.9–10.3)
Chloride: 104 mmol/L (ref 101–111)
Creatinine, Ser: 1.88 mg/dL — ABNORMAL HIGH (ref 0.44–1.00)
GFR calc non Af Amer: 24 mL/min — ABNORMAL LOW (ref >60)
GFR, EST AFRICAN AMERICAN: 28 mL/min — AB (ref >60)
Glucose, Bld: 228 mg/dL — ABNORMAL HIGH (ref 65–99)
Potassium: 4.1 mmol/L (ref 3.5–5.1)
Sodium: 141 mmol/L (ref 135–145)

## 2017-01-27 LAB — BRAIN NATRIURETIC PEPTIDE: B NATRIURETIC PEPTIDE 5: 926 pg/mL — AB (ref 0.0–100.0)

## 2017-01-27 LAB — TSH: TSH: 1.932 u[IU]/mL (ref 0.350–4.500)

## 2017-01-27 MED ORDER — FUROSEMIDE 40 MG PO TABS
ORAL_TABLET | ORAL | Status: AC
  Filled 2017-01-27: qty 1

## 2017-01-27 MED ORDER — FUROSEMIDE 40 MG PO TABS
20.0000 mg | ORAL_TABLET | Freq: Once | ORAL | Status: AC
  Administered 2017-01-27: 20 mg via ORAL
  Filled 2017-01-27: qty 1

## 2017-01-27 NOTE — ED Provider Notes (Addendum)
[facility] Provider Note   CSN: [id number] Arrival date & time: 01/27/17  1024     History   Chief Complaint No chief complaint on file. Complaint leg swelling.  HPI Barbara BOTELLO is a 82 y.o. female.  HPI Complains of bilateral leg swelling below the knees for the past 3 weeks, progressively worsening.  No change in medications.  No shortness of breath.  No orthopnea she denies pain anywhere.  Nothing makes symptoms better or worse.  No other associated symptoms Past Medical History:  Diagnosis Date  . Anxiety   . Atrial fibrillation ([place])   . CHF (congestive heart failure) ([place])   . Diabetes mellitus without complication ([place])   . Hypertension     Patient Active Problem List   Diagnosis Date Noted  . IDA (iron deficiency anemia) 12/14/2016  . Acute respiratory failure with hypoxemia ([place]) 03/19/2016  . Acute systolic CHF (congestive heart failure) ([place]) 03/19/2016  . HTN (hypertension) 03/19/2016  . CKD (chronic kidney disease) stage 3, GFR 30-59 ml/min ([facility]) 03/19/2016  . Atrial fibrillation ([place]) [I48.91] 03/09/2016  . Encounter for therapeutic drug monitoring 03/09/2016    Past Surgical History:  Procedure Laterality Date  . BACK SURGERY    . EYE SURGERY Left     OB History    No data available       Home Medications    Prior to Admission medications   Medication Sig Start Date End Date Taking? Authorizing Provider  aspirin EC 81 MG tablet Take 81 mg by mouth daily.    [provider]  carvedilol (COREG) 12.5 MG tablet Take 1 tablet (12.5 mg total) by mouth 2 (two) times daily. 03/22/16   Isaac Bliss, Rayford Halsted, MD  furosemide (LASIX) 40 MG tablet Take lasix 40 mg and 20 mg on alternate days 07/24/16   Herminio Commons, MD  metFORMIN (GLUCOPHAGE) 500 MG tablet Take 1,000 mg by mouth 2 (two) times daily with a meal.    [provider]  potassium chloride SA (K-DUR,KLOR-CON) 20 MEQ tablet Take 1 tablet (20 mEq  total) by mouth daily. 03/23/16   Isaac Bliss, Rayford Halsted, MD  warfarin (COUMADIN) 5 MG tablet Take 1 tablet daily except 1 1/2 tablets on Mondays and Thursdays 09/15/16   Herminio Commons, MD    Family History Family History  Problem Relation Age of Onset  . Colon cancer Neg Hx     Social History Social History   Tobacco Use  . Smoking status: Never Smoker  . Smokeless tobacco: Never Used  Substance Use Topics  . Alcohol use: No  . Drug use: No     Allergies   Patient has no known allergies.   Review of Systems Review of Systems  Constitutional: Negative.   HENT: Negative.   Respiratory: Negative.   Cardiovascular: Positive for leg swelling.  Gastrointestinal: Negative.   Musculoskeletal: Negative.   Skin: Negative.   Allergic/Immunologic: Positive for immunocompromised state.       Diabetic  Neurological: Negative.   Psychiatric/Behavioral: Negative.   All other systems reviewed and are negative.    Physical Exam Updated Vital Signs BP (!) 143/93 (BP Location: Right Arm)   Pulse 98   Temp 99.3 F (37.4 C) (Oral)   Resp 18   Ht 5\' 7"  (1.702 m)   Wt 90.7 kg (200 lb)   SpO2 97%   BMI 31.32 kg/m   Physical Exam  Constitutional: She appears well-developed and well-nourished.  HENT:  Head: Normocephalic and atraumatic.  Eyes: Conjunctivae are normal. Pupils are equal, round, and reactive to light.  Neck: Neck supple. No tracheal deviation present. No thyromegaly present.  Cardiovascular: Normal rate.  Murmur heard.  3/6 systolic murmur irregularly irregular  Pulmonary/Chest: Effort normal and breath sounds normal.  Abdominal: Soft. Bowel sounds are normal. She exhibits no distension. There is no tenderness.  Musculoskeletal: Normal range of motion. She exhibits edema. She exhibits no tenderness.  2+ pretibial pitting edema bilaterally  Neurological: She is alert. Coordination normal.  Skin: Skin is warm and dry. No rash noted.  Psychiatric: She  has a normal mood and affect.  Nursing note and vitals reviewed.    ED Treatments / Results  Labs (all labs ordered are listed, but only abnormal results are displayed) Labs Reviewed  BASIC METABOLIC PANEL    EKG  EKG Interpretation None       Radiology No results found.  Procedures Procedures (including critical care time)  Medications Ordered in ED Medications - No data to display  Results for orders placed or performed during the hospital encounter of 09/32/67  Basic metabolic panel  Result Value Ref Range   Sodium 141 135 - 145 mmol/L   Potassium 4.1 3.5 - 5.1 mmol/L   Chloride 104 101 - 111 mmol/L   CO2 26 22 - 32 mmol/L   Glucose, Bld 228 (H) 65 - 99 mg/dL   BUN 49 (H) 6 - 20 mg/dL   Creatinine, Ser 1.88 (H) 0.44 - 1.00 mg/dL   Calcium 8.6 (L) 8.9 - 10.3 mg/dL   GFR calc non Af Amer 24 (L) >60 mL/min   GFR calc Af Amer 28 (L) >60 mL/min   Anion gap 11 5 - 15  Protime-INR  Result Value Ref Range   Prothrombin Time 24.1 (H) 11.4 - 15.2 seconds   INR 2.19   Brain natriuretic peptide  Result Value Ref Range   B Natriuretic Peptide 926.0 (H) 0.0 - 100.0 pg/mL  TSH  Result Value Ref Range   TSH 1.932 0.350 - 4.500 uIU/mL   No results found. Initial Impression / Assessment and Plan / ED Course  I have reviewed the triage vital signs and the nursing notes.  Pertinent labs & imaging results that were available during my care of the patient were reviewed by me and considered in my medical decision making (see chart for details).     2 PM she resting comfortably.  Denies shortness of breath.  Case discussed with Dr. Harl Bowie from cardiology.  Plan increase Lasix to 60 mg daily.  She will be given Lasix 20 mg orally here prior to discharge, she is already taken her daily dose of Lasix today.  An appointment has been scheduled for to see Dr. Harl Bowie in the office February 03, 2017 at 9:40 AM Renal insufficiency is chronic Final Clinical Impressions(s) / ED Diagnoses   Diagnoses #1 acute on chronic congestive heart failure #2 hyperglycemia #3 chronic renal insufficiency Final diagnoses:  None    ED Discharge Orders    None       Orlie Dakin, MD 01/27/17 Yoe, MD 01/27/17 1559

## 2017-01-27 NOTE — ED Triage Notes (Signed)
Lower extremity swelling for 2-3 weeks.  States her cardiologist is out of the office this week.

## 2017-01-27 NOTE — Telephone Encounter (Signed)
Returned pt's daughter's call. She states that she has her mother in the ER now because she has some swelling, SOB. She will inform us of any changes.

## 2017-01-27 NOTE — Telephone Encounter (Signed)
Per phone call from pt's daughter Lahoma Crocker stating pt is having swelling and is also having SOB, states medication doesn't seem to be working. Please give Ivin Booty a call @ [phone]

## 2017-01-27 NOTE — Discharge Instructions (Signed)
Increase your dose of Lasix (furosemide) to 60 mg daily.  An appointment has been scheduled for you at the cardiology office to see Dr. Harl Bowie at 9:40 AM on Wednesday, February 03, 2017.  Return if you develop shortness of breath or feel worse for any reason

## 2017-02-03 ENCOUNTER — Encounter: Payer: Self-pay | Admitting: Cardiology

## 2017-02-03 ENCOUNTER — Ambulatory Visit (INDEPENDENT_AMBULATORY_CARE_PROVIDER_SITE_OTHER): Payer: Medicare Other | Admitting: Cardiology

## 2017-02-03 VITALS — BP 116/64 | HR 80 | Ht 67.0 in | Wt 200.6 lb

## 2017-02-03 DIAGNOSIS — Z79899 Other long term (current) drug therapy: Secondary | ICD-10-CM

## 2017-02-03 DIAGNOSIS — I5023 Acute on chronic systolic (congestive) heart failure: Secondary | ICD-10-CM

## 2017-02-03 LAB — BASIC METABOLIC PANEL
BUN / CREAT RATIO: 25 (calc) — AB (ref 6–22)
BUN: 44 mg/dL — ABNORMAL HIGH (ref 7–25)
CO2: 29 mmol/L (ref 20–32)
CREATININE: 1.76 mg/dL — AB (ref 0.60–0.93)
Calcium: 8.8 mg/dL (ref 8.6–10.4)
Chloride: 101 mmol/L (ref 98–110)
GLUCOSE: 190 mg/dL — AB (ref 65–139)
Potassium: 4.1 mmol/L (ref 3.5–5.3)
Sodium: 139 mmol/L (ref 135–146)

## 2017-02-03 LAB — MAGNESIUM: Magnesium: 2 mg/dL (ref 1.5–2.5)

## 2017-02-03 NOTE — Progress Notes (Signed)
Clinical Summary Ms. Brogden is a 82 y.o.female normal patient of Dr Lambert Mody, seen today as an add on post-ER visit  1. Chronic systolic HF/ LE edema - history of chronic systolic HF.  - 0/2542 echo LVEF 40-45%, cannot eval diastoilc function, mild to mod AI, mild to mod MR, severe LAE, PASP 40. There has been some question about a possible bicuspid AV - notes indicate prior issues with sodium restriction in the past  - seen in ER Jan 27, 2017 with increased LE edema x 3 weeks.PCP had doubled her lasix without benefit Lasix increased to 60mg  daily from ER and discharged - may have had some extra salt during holidays - since increase swelling has been improving.   Past Medical History:  Diagnosis Date  . Anxiety   . Atrial fibrillation ([place])   . CHF (congestive heart failure) ([place])   . Diabetes mellitus without complication ([place])   . Hypertension      No Known Allergies   Current Outpatient Medications  Medication Sig Dispense Refill  . aspirin EC 81 MG tablet Take 81 mg by mouth daily.    . carvedilol (COREG) 12.5 MG tablet Take 1 tablet (12.5 mg total) by mouth 2 (two) times daily. 60 tablet 2  . Cholecalciferol (VITAMIN D PO) Take 1 tablet by mouth daily.    . furosemide (LASIX) 40 MG tablet Take lasix 40 mg and 20 mg on alternate days 90 tablet 3  . metFORMIN (GLUCOPHAGE) 500 MG tablet Take 1,000 mg by mouth 2 (two) times daily with a meal.    . potassium chloride SA (K-DUR,KLOR-CON) 20 MEQ tablet Take 1 tablet (20 mEq total) by mouth daily. 30 tablet 2  . warfarin (COUMADIN) 5 MG tablet Take 1 tablet daily except 1 1/2 tablets on Mondays and Thursdays (Patient taking differently: Take 5 mg by mouth one time only at 6 PM. Take 1 tablet daily except 1/2 tablet on Tuesday and Friday.) 40 tablet 3   No current facility-administered medications for this visit.      Past Surgical History:  Procedure Laterality Date  . BACK SURGERY    . EYE SURGERY Left      No  Known Allergies    Family History  Problem Relation Age of Onset  . Colon cancer Neg Hx      Social History Ms. Aube reports that  has never smoked. she has never used smokeless tobacco. Ms. Jago reports that she does not drink alcohol.   Review of Systems CONSTITUTIONAL: No weight loss, fever, chills, weakness or fatigue.  HEENT: Eyes: No visual loss, blurred vision, double vision or yellow sclerae.No hearing loss, sneezing, congestion, runny nose or sore throat.  SKIN: No rash or itching.  CARDIOVASCULAR: no chest pain, no palpitations.  RESPIRATORY: No shortness of breath, cough or sputum.  GASTROINTESTINAL: No anorexia, nausea, vomiting or diarrhea. No abdominal pain or blood.  GENITOURINARY: No burning on urination, no polyuria NEUROLOGICAL: No headache, dizziness, syncope, paralysis, ataxia, numbness or tingling in the extremities. No change in bowel or bladder control.  MUSCULOSKELETAL: No muscle, back pain, joint pain or stiffness.  LYMPHATICS: No enlarged nodes. No history of splenectomy.  PSYCHIATRIC: No history of depression or anxiety.  ENDOCRINOLOGIC: No reports of sweating, cold or heat intolerance. No polyuria or polydipsia.  Marland Kitchen   Physical Examination Vitals:   02/03/17 0940  BP: 116/64  Pulse: 80  SpO2: 97%   Vitals:   02/03/17 0940  Weight: 200 lb  9.6 oz (91 kg)  Height: 5\' 7"  (1.702 m)     Gen: resting comfortably, no acute distress HEENT: no scleral icterus, pupils equal round and reactive, no palptable cervical adenopathy,  CV: RRR, no m/r/g no jvd Resp: Clear to auscultation bilaterally GI: abdomen is soft, non-tender, non-distended, normal bowel sounds, no hepatosplenomegaly MSK: extremities are warm, trace bilateral edema Skin: warm, no rash Neuro:  no focal deficits Psych: appropriate affect     Assessment and Plan   1. Acute on chronic systolic HF - recent in crease in LE edema, improving with lasix 60mg  daily - we will repeat  labs to reevaluate renal function and electrolytes .     Arnoldo Lenis, M.D.

## 2017-02-03 NOTE — Patient Instructions (Signed)
Medication Instructions:  Continue taking lasix 60 mg daily until you hear from [place] concerning results to lab work   Labwork: Today bmet magnesium  Testing/Procedures: none  Follow-Up: Your physician recommends that you schedule a follow-up appointment: with Dr. Bronson Ing    Any Other Special Instructions Will Be Listed Below (If Applicable).     If you need a refill on your cardiac medications before your next appointment, please call your pharmacy.

## 2017-02-08 ENCOUNTER — Telehealth: Payer: Self-pay

## 2017-02-08 ENCOUNTER — Encounter: Payer: Self-pay | Admitting: Cardiology

## 2017-02-08 MED ORDER — FUROSEMIDE 40 MG PO TABS: ORAL_TABLET | ORAL | 3 refills | Status: DC

## 2017-02-08 NOTE — Telephone Encounter (Signed)
Patient verbalized understanding that she may resume lasix 40 mg daily.She may take 60 mg daily for up to 3 days for for swelling.

## 2017-02-08 NOTE — Telephone Encounter (Signed)
-----   Message from Arnoldo Lenis, MD sent at 02/08/2017  8:22 AM EST ----- Labs show her kidney function is decreased but overall stable over the last few months. I would go back to lasix 40mg  daily, can take 60mg  if has swelling for up to 3 days as needed.   Zandra Abts MD

## 2017-02-10 ENCOUNTER — Ambulatory Visit (INDEPENDENT_AMBULATORY_CARE_PROVIDER_SITE_OTHER): Payer: Medicare Other | Admitting: *Deleted

## 2017-02-10 DIAGNOSIS — Z5181 Encounter for therapeutic drug level monitoring: Secondary | ICD-10-CM

## 2017-02-10 DIAGNOSIS — I4891 Unspecified atrial fibrillation: Secondary | ICD-10-CM | POA: Diagnosis not present

## 2017-02-10 LAB — POCT INR: INR: 3.3

## 2017-02-10 NOTE — Patient Instructions (Signed)
Hold coumadin tonight then resume 1 tablet daily except 1/2 tablet on Tuesdays and Fridays   Continue greens/salads. Recheck in 4 weeks

## 2017-02-25 ENCOUNTER — Encounter (HOSPITAL_COMMUNITY): Payer: Self-pay | Admitting: Emergency Medicine

## 2017-02-25 ENCOUNTER — Emergency Department (HOSPITAL_COMMUNITY)
Admission: EM | Admit: 2017-02-25 | Discharge: 2017-02-25 | Disposition: A | Discharge status: Home / Self Care | Payer: Medicare Other | Attending: Emergency Medicine | Admitting: Emergency Medicine

## 2017-02-25 ENCOUNTER — Emergency Department (HOSPITAL_COMMUNITY): Payer: Medicare Other

## 2017-02-25 DIAGNOSIS — N183 Chronic kidney disease, stage 3 (moderate): Secondary | ICD-10-CM | POA: Diagnosis not present

## 2017-02-25 DIAGNOSIS — Y998 Other external cause status: Secondary | ICD-10-CM | POA: Insufficient documentation

## 2017-02-25 DIAGNOSIS — S93401A Sprain of unspecified ligament of right ankle, initial encounter: Secondary | ICD-10-CM | POA: Insufficient documentation

## 2017-02-25 DIAGNOSIS — Z7901 Long term (current) use of anticoagulants: Secondary | ICD-10-CM | POA: Insufficient documentation

## 2017-02-25 DIAGNOSIS — F419 Anxiety disorder, unspecified: Secondary | ICD-10-CM | POA: Diagnosis not present

## 2017-02-25 DIAGNOSIS — W000XXA Fall on same level due to ice and snow, initial encounter: Secondary | ICD-10-CM | POA: Diagnosis not present

## 2017-02-25 DIAGNOSIS — Z7982 Long term (current) use of aspirin: Secondary | ICD-10-CM | POA: Diagnosis not present

## 2017-02-25 DIAGNOSIS — I130 Hypertensive heart and chronic kidney disease with heart failure and stage 1 through stage 4 chronic kidney disease, or unspecified chronic kidney disease: Secondary | ICD-10-CM | POA: Diagnosis not present

## 2017-02-25 DIAGNOSIS — Y929 Unspecified place or not applicable: Secondary | ICD-10-CM | POA: Diagnosis not present

## 2017-02-25 DIAGNOSIS — S99911A Unspecified injury of right ankle, initial encounter: Secondary | ICD-10-CM | POA: Diagnosis present

## 2017-02-25 DIAGNOSIS — Z79899 Other long term (current) drug therapy: Secondary | ICD-10-CM | POA: Insufficient documentation

## 2017-02-25 DIAGNOSIS — E1122 Type 2 diabetes mellitus with diabetic chronic kidney disease: Secondary | ICD-10-CM | POA: Insufficient documentation

## 2017-02-25 DIAGNOSIS — Y9389 Activity, other specified: Secondary | ICD-10-CM | POA: Insufficient documentation

## 2017-02-25 DIAGNOSIS — I5021 Acute systolic (congestive) heart failure: Secondary | ICD-10-CM | POA: Insufficient documentation

## 2017-02-25 NOTE — ED Triage Notes (Signed)
Pt states she slipped on ice a week ago and her right ankle continues to hurt.  Ambulatory without difficulty.  Drove self to ED.

## 2017-02-25 NOTE — Discharge Instructions (Signed)
You were evaluated in the emergency department for right ankle pain after a fall.  The x-rays did not show any sign of fracture.  We are wrapping her ankle up an elastic bandage and you will need to keep it elevated.  Please call your primary care doctor to schedule a follow-up appointment.  You should return if any worsening symptoms.

## 2017-02-25 NOTE — ED Notes (Signed)
Patient transported to X-ray 

## 2017-02-25 NOTE — ED Provider Notes (Signed)
[facility] EMERGENCY DEPARTMENT Provider Note   CSN: [id number] Arrival date & time: 02/25/17  4098     History   Chief Complaint Chief Complaint  Patient presents with  . Fall    HPI Barbara Stone is a 82 y.o. female.  The history is provided by the patient.  Fall  This is a new problem. The current episode started more than 1 week ago. The problem occurs constantly. The problem has been gradually improving. Pertinent negatives include no chest pain, no abdominal pain, no headaches and no shortness of breath. The symptoms are aggravated by walking. The symptoms are relieved by rest. She has tried rest for the symptoms. The treatment provided mild relief.    82 year old female complaining of a slip and fall about a week ago on the ice on the porch.  She injured her right lower leg.  She states since then it has been hurting her mostly in the mornings after its been at rest from sleeping.  Pain is primarily on the lateral side of the ankle.  She denies any other injury.  She thought it would get better but since it had and she finally presents.  And noticed some swelling in that foot and ankle but she also has chronic peripheral edema.  She denies any numbness or tingling.  She is very hard of hearing but seems to be an appropriate historian.  Past Medical History:  Diagnosis Date  . Anxiety   . Atrial fibrillation ([place])   . CHF (congestive heart failure) ([place])   . Diabetes mellitus without complication ([place])   . Hypertension     Patient Active Problem List   Diagnosis Date Noted  . IDA (iron deficiency anemia) 12/14/2016  . Acute respiratory failure with hypoxemia ([place]) 03/19/2016  . Acute systolic CHF (congestive heart failure) ([place]) 03/19/2016  . HTN (hypertension) 03/19/2016  . CKD (chronic kidney disease) stage 3, GFR 30-59 ml/min (HCC) 03/19/2016  . Atrial fibrillation ([place]) [I48.91] 03/09/2016  . Encounter for therapeutic drug monitoring 03/09/2016    Past  Surgical History:  Procedure Laterality Date  . BACK SURGERY    . EYE SURGERY Left     OB History    No data available       Home Medications    Prior to Admission medications   Medication Sig Start Date End Date Taking? Authorizing Provider  aspirin EC 81 MG tablet Take 81 mg by mouth daily.   Yes [provider]  carvedilol (COREG) 12.5 MG tablet Take 1 tablet (12.5 mg total) by mouth 2 (two) times daily. 03/22/16  Yes Isaac Bliss, Rayford Halsted, MD  Cholecalciferol (VITAMIN D PO) Take 1 tablet by mouth daily.   Yes [provider]  furosemide (LASIX) 40 MG tablet Take 40 mg daily.May take 60 mg daily for 3 days for swelling 02/08/17  Yes Branch, Alphonse Guild, MD  potassium chloride SA (K-DUR,KLOR-CON) 20 MEQ tablet Take 1 tablet (20 mEq total) by mouth daily. 03/23/16  Yes Isaac Bliss, Rayford Halsted, MD  warfarin (COUMADIN) 5 MG tablet Take 1 tablet daily except 1 1/2 tablets on Mondays and Thursdays Patient taking differently: Take 5 mg by mouth one time only at 6 PM. Take 1 tablet daily except 1/2 tablet on Tuesday and Friday. 09/15/16  Yes Herminio Commons, MD    Family History Family History  Problem Relation Age of Onset  . Colon cancer Neg Hx     Social History Social History  Tobacco Use  . Smoking status: Never Smoker  . Smokeless tobacco: Never Used  Substance Use Topics  . Alcohol use: No  . Drug use: No     Allergies   Patient has no known allergies.   Review of Systems Review of Systems  Constitutional: Negative for fever.  HENT: Negative for sore throat.   Respiratory: Negative for shortness of breath.   Cardiovascular: Negative for chest pain.  Gastrointestinal: Negative for abdominal pain.  Genitourinary: Negative for dysuria.  Musculoskeletal: Negative for back pain and neck pain.  Skin: Negative for rash.  Neurological: Negative for headaches.     Physical Exam Updated Vital Signs BP (!) 130/101 (BP Location: Left  Arm)   Pulse 89   Temp 98 F (36.7 C) (Oral)   Resp 16   Ht 5\' 6"  (1.676 m)   Wt 90.7 kg (200 lb)   SpO2 100%   BMI 32.28 kg/m   Physical Exam  Constitutional: She appears well-developed and well-nourished. No distress.  HENT:  Head: Normocephalic and atraumatic.  Eyes: Conjunctivae are normal.  Neck: Neck supple.  Cardiovascular: Normal rate. An irregularly irregular rhythm present.  Murmur heard.  Systolic murmur is present with a grade of 2/6. Pulmonary/Chest: Effort normal and breath sounds normal. No respiratory distress.  Abdominal: Soft. There is no tenderness.  Musculoskeletal: She exhibits no edema.       Right ankle: She exhibits decreased range of motion and swelling. Tenderness. Lateral malleolus tenderness found. No medial malleolus, no head of 5th metatarsal and no proximal fibula tenderness found. Achilles tendon exhibits no pain.  Neurological: She is alert.  Skin: Skin is warm and dry.  Psychiatric: She has a normal mood and affect.  Nursing note and vitals reviewed.    ED Treatments / Results  Labs (all labs ordered are listed, but only abnormal results are displayed) Labs Reviewed - No data to display  EKG  EKG Interpretation None       Radiology Dg Tibia/fibula Right  Result Date: 02/25/2017 CLINICAL DATA:  Fall off porch.  Pain and swelling. EXAM: RIGHT TIBIA AND FIBULA - 2 VIEW COMPARISON:  None FINDINGS: Moderate to advanced degenerative changes within the knee. There is diffuse soft tissue swelling identified. No fracture or dislocation identified. IMPRESSION: 1. No acute bone abnormality. 2. Right knee osteoarthritis. Electronically Signed   By: Kerby Moors M.D.   On: 02/25/2017 10:07   Dg Ankle Complete Right  Result Date: 02/25/2017 CLINICAL DATA:  Pain and swelling. EXAM: RIGHT ANKLE - COMPLETE 3+ VIEW COMPARISON:  No recent. FINDINGS: Diffuse severe soft tissue swelling. No evidence of fracture or dislocation. No acute bony  abnormality. IMPRESSION: Diffuse severe soft tissue swelling. No acute bony or joint abnormality. Electronically Signed   By: Marcello Moores  Register   On: 02/25/2017 10:12    Procedures Procedures (including critical care time)  Medications Ordered in ED Medications - No data to display   Initial Impression / Assessment and Plan / ED Course  I have reviewed the triage vital signs and the nursing notes.  Pertinent labs & imaging results that were available during my care of the patient were reviewed by me and considered in my medical decision making (see chart for details).  Clinical Course as of Feb 25 1933  Thu Feb 25, 2017  1017 Imaging with no obvious fracture.  [MB]  1024 Reviwed with patient.   [MB]    Clinical Course User Index [MB] Hayden Rasmussen, MD  Final Clinical Impressions(s) / ED Diagnoses   Final diagnoses:  Sprain of right ankle, unspecified ligament, initial encounter    ED Discharge Orders    None       Hayden Rasmussen, MD 02/25/17 [phone]

## 2017-02-26 ENCOUNTER — Ambulatory Visit: Payer: Medicare Other | Admitting: Cardiology

## 2017-03-10 ENCOUNTER — Ambulatory Visit (INDEPENDENT_AMBULATORY_CARE_PROVIDER_SITE_OTHER): Payer: Medicare Other | Admitting: *Deleted

## 2017-03-10 DIAGNOSIS — Z5181 Encounter for therapeutic drug level monitoring: Secondary | ICD-10-CM | POA: Diagnosis not present

## 2017-03-10 DIAGNOSIS — I4891 Unspecified atrial fibrillation: Secondary | ICD-10-CM

## 2017-03-10 LAB — POCT INR: INR: 2.8

## 2017-03-10 NOTE — Patient Instructions (Signed)
Continue coumadin 1 tablet daily except 1/2 tablet on Tuesdays and Fridays   Continue greens/salads. Recheck in 4 weeks

## 2017-03-24 ENCOUNTER — Emergency Department (HOSPITAL_COMMUNITY)
Admission: EM | Admit: 2017-03-24 | Discharge: 2017-03-24 | Disposition: A | Discharge status: Home / Self Care | Payer: Medicare Other | Attending: Emergency Medicine | Admitting: Emergency Medicine

## 2017-03-24 ENCOUNTER — Encounter (HOSPITAL_COMMUNITY): Payer: Self-pay | Admitting: Emergency Medicine

## 2017-03-24 ENCOUNTER — Emergency Department (HOSPITAL_COMMUNITY): Payer: Medicare Other

## 2017-03-24 ENCOUNTER — Other Ambulatory Visit: Payer: Self-pay

## 2017-03-24 DIAGNOSIS — Z7984 Long term (current) use of oral hypoglycemic drugs: Secondary | ICD-10-CM | POA: Insufficient documentation

## 2017-03-24 DIAGNOSIS — Z7901 Long term (current) use of anticoagulants: Secondary | ICD-10-CM | POA: Diagnosis not present

## 2017-03-24 DIAGNOSIS — Z79899 Other long term (current) drug therapy: Secondary | ICD-10-CM | POA: Diagnosis not present

## 2017-03-24 DIAGNOSIS — R0602 Shortness of breath: Secondary | ICD-10-CM | POA: Diagnosis present

## 2017-03-24 DIAGNOSIS — I130 Hypertensive heart and chronic kidney disease with heart failure and stage 1 through stage 4 chronic kidney disease, or unspecified chronic kidney disease: Secondary | ICD-10-CM | POA: Insufficient documentation

## 2017-03-24 DIAGNOSIS — Z7982 Long term (current) use of aspirin: Secondary | ICD-10-CM | POA: Insufficient documentation

## 2017-03-24 DIAGNOSIS — I5020 Unspecified systolic (congestive) heart failure: Secondary | ICD-10-CM | POA: Insufficient documentation

## 2017-03-24 DIAGNOSIS — E119 Type 2 diabetes mellitus without complications: Secondary | ICD-10-CM | POA: Diagnosis not present

## 2017-03-24 DIAGNOSIS — N183 Chronic kidney disease, stage 3 (moderate): Secondary | ICD-10-CM | POA: Insufficient documentation

## 2017-03-24 LAB — COMPREHENSIVE METABOLIC PANEL
ALT: 20 U/L (ref 14–54)
AST: 30 U/L (ref 15–41)
Albumin: 3.6 g/dL (ref 3.5–5.0)
Alkaline Phosphatase: 90 U/L (ref 38–126)
Anion gap: 10 (ref 5–15)
BILIRUBIN TOTAL: 1.4 mg/dL — AB (ref 0.3–1.2)
BUN: 47 mg/dL — ABNORMAL HIGH (ref 6–20)
CALCIUM: 8.8 mg/dL — AB (ref 8.9–10.3)
CO2: 24 mmol/L (ref 22–32)
CREATININE: 1.95 mg/dL — AB (ref 0.44–1.00)
Chloride: 105 mmol/L (ref 101–111)
GFR calc Af Amer: 27 mL/min — ABNORMAL LOW (ref >60)
GFR, EST NON AFRICAN AMERICAN: 23 mL/min — AB (ref >60)
Glucose, Bld: 239 mg/dL — ABNORMAL HIGH (ref 65–99)
Potassium: 4.4 mmol/L (ref 3.5–5.1)
Sodium: 139 mmol/L (ref 135–145)
TOTAL PROTEIN: 7.2 g/dL (ref 6.5–8.1)

## 2017-03-24 LAB — CBC WITH DIFFERENTIAL/PLATELET
Basophils Absolute: 0 10*3/uL (ref 0.0–0.1)
Basophils Relative: 0 %
Eosinophils Absolute: 0.1 10*3/uL (ref 0.0–0.7)
Eosinophils Relative: 3 %
HEMATOCRIT: 35.5 % — AB (ref 36.0–46.0)
Hemoglobin: 10.6 g/dL — ABNORMAL LOW (ref 12.0–15.0)
LYMPHS ABS: 0.6 10*3/uL — AB (ref 0.7–4.0)
LYMPHS PCT: 11 %
MCH: 28.9 pg (ref 26.0–34.0)
MCHC: 29.9 g/dL — ABNORMAL LOW (ref 30.0–36.0)
MCV: 96.7 fL (ref 78.0–100.0)
MONO ABS: 0.6 10*3/uL (ref 0.1–1.0)
Monocytes Relative: 10 %
NEUTROS ABS: 4 10*3/uL (ref 1.7–7.7)
Neutrophils Relative %: 76 %
Platelets: 228 10*3/uL (ref 150–400)
RBC: 3.67 MIL/uL — AB (ref 3.87–5.11)
RDW: 18.9 % — AB (ref 11.5–15.5)
WBC: 5.3 10*3/uL (ref 4.0–10.5)

## 2017-03-24 LAB — BRAIN NATRIURETIC PEPTIDE: B Natriuretic Peptide: 891 pg/mL — ABNORMAL HIGH (ref 0.0–100.0)

## 2017-03-24 LAB — CBG MONITORING, ED: Glucose-Capillary: 182 mg/dL — ABNORMAL HIGH (ref 65–99)

## 2017-03-24 LAB — TROPONIN I: Troponin I: 0.03 ng/mL (ref <0.03)

## 2017-03-24 MED ORDER — FUROSEMIDE 10 MG/ML IJ SOLN
40.0000 mg | Freq: Once | INTRAMUSCULAR | Status: AC
  Administered 2017-03-24: 40 mg via INTRAVENOUS
  Filled 2017-03-24: qty 4

## 2017-03-24 NOTE — ED Triage Notes (Signed)
Pt c/o increased sob and wants to be checked for chf.

## 2017-03-24 NOTE — ED Notes (Signed)
Pt given meal tray.

## 2017-03-24 NOTE — Discharge Instructions (Signed)
Increase your Lasix to take 60 mg a day or one and a half  40 mg tablets.

## 2017-03-24 NOTE — ED Notes (Signed)
Pt ambulating to rest room.

## 2017-03-24 NOTE — ED Provider Notes (Signed)
[facility] EMERGENCY DEPARTMENT Provider Note   CSN: [id number] Arrival date & time: 03/24/17  4496     History   Chief Complaint Chief Complaint  Patient presents with  . Shortness of Breath    HPI Barbara Stone is a 82 y.o. female.  Patient complains of shortness of breath.  She also has increased swelling to her ankles.  Patient has a history of congestive heart failure.  No chest pain   The history is provided by the patient. No language interpreter was used.  Shortness of Breath  This is a new problem. The current episode started 3 to 5 hours ago. The problem has not changed since onset.Pertinent negatives include no fever, no headaches, no cough, no chest pain, no abdominal pain and no rash. It is unknown what precipitated the problem. She has tried nothing for the symptoms. The treatment provided no relief. She has had prior hospitalizations. She has had prior ED visits. Associated medical issues include CAD. Associated medical issues do not include asthma.    Past Medical History:  Diagnosis Date  . Anxiety   . Atrial fibrillation ([place])   . CHF (congestive heart failure) ([place])   . Diabetes mellitus without complication ([place])   . Hypertension     Patient Active Problem List   Diagnosis Date Noted  . IDA (iron deficiency anemia) 12/14/2016  . Acute respiratory failure with hypoxemia ([place]) 03/19/2016  . Acute systolic CHF (congestive heart failure) ([place]) 03/19/2016  . HTN (hypertension) 03/19/2016  . CKD (chronic kidney disease) stage 3, GFR 30-59 ml/min (HCC) 03/19/2016  . Atrial fibrillation ([place]) [I48.91] 03/09/2016  . Encounter for therapeutic drug monitoring 03/09/2016    Past Surgical History:  Procedure Laterality Date  . BACK SURGERY    . EYE SURGERY Left     OB History    No data available       Home Medications    Prior to Admission medications   Medication Sig Start Date End Date Taking? Authorizing Provider  aspirin EC 81 MG tablet  Take 81 mg by mouth daily.   Yes [provider]  carvedilol (COREG) 12.5 MG tablet Take 1 tablet (12.5 mg total) by mouth 2 (two) times daily. 03/22/16  Yes Isaac Bliss, Rayford Halsted, MD  Cholecalciferol (VITAMIN D PO) Take 1 tablet by mouth daily.   Yes [provider]  furosemide (LASIX) 40 MG tablet Take 40 mg daily.May take 60 mg daily for 3 days for swelling 02/08/17  Yes Branch, Alphonse Guild, MD  glimepiride (AMARYL) 1 MG tablet Take 1 mg by mouth daily with breakfast.   Yes [provider]  potassium chloride SA (K-DUR,KLOR-CON) 20 MEQ tablet Take 1 tablet (20 mEq total) by mouth daily. 03/23/16  Yes Isaac Bliss, Rayford Halsted, MD  warfarin (COUMADIN) 5 MG tablet Take 1 tablet daily except 1 1/2 tablets on Mondays and Thursdays Patient taking differently: Take 5 mg by mouth one time only at 6 PM. Take 1 tablet daily except 1/2 tablet on Tuesday and Friday. 09/15/16  Yes Herminio Commons, MD    Family History Family History  Problem Relation Age of Onset  . Colon cancer Neg Hx     Social History Social History   Tobacco Use  . Smoking status: Never Smoker  . Smokeless tobacco: Never Used  Substance Use Topics  . Alcohol use: No  . Drug use: No     Allergies   Patient has no known allergies.  Review of Systems Review of Systems  Constitutional: Negative for appetite change, fatigue and fever.  HENT: Negative for congestion, ear discharge and sinus pressure.   Eyes: Negative for discharge.  Respiratory: Positive for shortness of breath. Negative for cough.   Cardiovascular: Negative for chest pain.  Gastrointestinal: Negative for abdominal pain and diarrhea.  Genitourinary: Negative for frequency and hematuria.  Musculoskeletal: Negative for back pain.  Skin: Negative for rash.  Neurological: Negative for seizures and headaches.  Psychiatric/Behavioral: Negative for hallucinations.     Physical Exam Updated Vital Signs BP (!) 150/94    Pulse (!) 108   Temp 98 F (36.7 C) (Oral)   Resp 20   Ht 5\' 6"  (1.676 m)   Wt 90.7 kg (200 lb)   SpO2 97%   BMI 32.28 kg/m   Physical Exam  Constitutional: She is oriented to person, place, and time. She appears well-developed.  HENT:  Head: Normocephalic.  Eyes: Conjunctivae and EOM are normal. No scleral icterus.  Neck: Neck supple. No thyromegaly present.  Cardiovascular: Normal rate and regular rhythm. Exam reveals no gallop and no friction rub.  No murmur heard. Pulmonary/Chest: No stridor. She has no wheezes. She has no rales. She exhibits no tenderness.  Abdominal: She exhibits no distension. There is no tenderness. There is no rebound.  Musculoskeletal: Normal range of motion. She exhibits edema.  Lymphadenopathy:    She has no cervical adenopathy.  Neurological: She is oriented to person, place, and time. She exhibits normal muscle tone. Coordination normal.  Skin: No rash noted. No erythema.  Psychiatric: She has a normal mood and affect. Her behavior is normal.     ED Treatments / Results  Labs (all labs ordered are listed, but only abnormal results are displayed) Labs Reviewed  CBC WITH DIFFERENTIAL/PLATELET - Abnormal; Notable for the following components:      Result Value   RBC 3.67 (*)    Hemoglobin 10.6 (*)    HCT 35.5 (*)    MCHC 29.9 (*)    RDW 18.9 (*)    Lymphs Abs 0.6 (*)    All other components within normal limits  COMPREHENSIVE METABOLIC PANEL - Abnormal; Notable for the following components:   Glucose, Bld 239 (*)    BUN 47 (*)    Creatinine, Ser 1.95 (*)    Calcium 8.8 (*)    Total Bilirubin 1.4 (*)    GFR calc non Af Amer 23 (*)    GFR calc Af Amer 27 (*)    All other components within normal limits  TROPONIN I - Abnormal; Notable for the following components:   Troponin I 0.03 (*)    All other components within normal limits  BRAIN NATRIURETIC PEPTIDE - Abnormal; Notable for the following components:   B Natriuretic Peptide 891.0  (*)    All other components within normal limits  CBG MONITORING, ED - Abnormal; Notable for the following components:   Glucose-Capillary 182 (*)    All other components within normal limits    EKG  EKG Interpretation  Date/Time:  Wednesday March 24 2017 07:14:14 EST Ventricular Rate:  103 PR Interval:    QRS Duration: 130 QT Interval:  366 QTC Calculation: 480 R Axis:   -61 Text Interpretation:  Atrial fibrillation Ventricular premature complex Left bundle branch block Confirmed by Milton Ferguson [phone]) on 03/24/2017 7:19:15 AM       Radiology Dg Chest Portable 1 View  Result Date: 03/24/2017 CLINICAL DATA:  Weakness, CHF, atrial  fibrillation, diabetes. EXAM: PORTABLE CHEST 1 VIEW COMPARISON:  Chest x-ray of July 17, 2016 FINDINGS: The lungs are reasonably well inflated. The cardiac silhouette is enlarged. The pulmonary vascularity is engorged. The interstitial markings are mildly prominent. There is no pleural effusion. There is calcification in the wall of the aortic arch. The observed bony thorax is unremarkable. There are degenerative changes of the right shoulder. IMPRESSION: CHF with mild interstitial edema. Thoracic aortic atherosclerosis. Electronically Signed   By: David  Martinique M.D.   On: 03/24/2017 07:52    Procedures Procedures (including critical care time)  Medications Ordered in ED Medications  furosemide (LASIX) injection 40 mg (40 mg Intravenous Given 03/24/17 0854)     Initial Impression / Assessment and Plan / ED Course  I have reviewed the triage vital signs and the nursing notes.  Pertinent labs & imaging results that were available during my care of the patient were reviewed by me and considered in my medical decision making (see chart for details).     Chest x-ray shows mild congestive heart failure.  BNP is mildly elevated.  Patient improved with some IV Lasix.  We will increase her Lasix from 40 mg a day to 60 mg a day and she is going to  follow-up with her PCP.  Patient's O2 sats have been above 95 on room air vital signs have been stable  Final Clinical Impressions(s) / ED Diagnoses   Final diagnoses:  Systolic congestive heart failure, unspecified HF chronicity [facility])    ED Discharge Orders    None       Milton Ferguson, MD 03/24/17 1143

## 2017-03-24 NOTE — ED Notes (Signed)
CRITICAL VALUE ALERT  Critical Value:  Troponin 0.03   Date & Time Notied:  03/24/17 5075  Provider Notified: Notified Dr. Roderic Palau   Orders Received/Actions taken: Notified MD

## 2017-04-07 ENCOUNTER — Ambulatory Visit (INDEPENDENT_AMBULATORY_CARE_PROVIDER_SITE_OTHER): Payer: Medicare Other | Admitting: *Deleted

## 2017-04-07 DIAGNOSIS — I4891 Unspecified atrial fibrillation: Secondary | ICD-10-CM | POA: Diagnosis not present

## 2017-04-07 DIAGNOSIS — Z5181 Encounter for therapeutic drug level monitoring: Secondary | ICD-10-CM

## 2017-04-07 LAB — POCT INR: INR: 2.5

## 2017-04-07 NOTE — Patient Instructions (Signed)
Continue coumadin 1 tablet daily except 1/2 tablet on Tuesdays and Fridays   Continue greens/salads. Recheck in 4 weeks

## 2017-04-12 ENCOUNTER — Encounter (INDEPENDENT_AMBULATORY_CARE_PROVIDER_SITE_OTHER): Payer: Medicare Other | Admitting: Ophthalmology

## 2017-04-19 ENCOUNTER — Encounter (INDEPENDENT_AMBULATORY_CARE_PROVIDER_SITE_OTHER): Payer: Medicare Other | Admitting: Ophthalmology

## 2017-04-19 DIAGNOSIS — E113512 Type 2 diabetes mellitus with proliferative diabetic retinopathy with macular edema, left eye: Secondary | ICD-10-CM | POA: Diagnosis not present

## 2017-04-19 DIAGNOSIS — E113591 Type 2 diabetes mellitus with proliferative diabetic retinopathy without macular edema, right eye: Secondary | ICD-10-CM

## 2017-04-19 DIAGNOSIS — I10 Essential (primary) hypertension: Secondary | ICD-10-CM

## 2017-04-19 DIAGNOSIS — E11311 Type 2 diabetes mellitus with unspecified diabetic retinopathy with macular edema: Secondary | ICD-10-CM

## 2017-04-19 DIAGNOSIS — H43811 Vitreous degeneration, right eye: Secondary | ICD-10-CM

## 2017-04-19 DIAGNOSIS — H35033 Hypertensive retinopathy, bilateral: Secondary | ICD-10-CM | POA: Diagnosis not present

## 2017-05-05 ENCOUNTER — Telehealth: Payer: Self-pay | Admitting: *Deleted

## 2017-05-05 MED ORDER — FUROSEMIDE 40 MG PO TABS: ORAL_TABLET | ORAL | 3 refills | Status: DC

## 2017-05-05 MED ORDER — WARFARIN SODIUM 5 MG PO TABS: ORAL_TABLET | ORAL | 3 refills | Status: DC

## 2017-05-05 NOTE — Telephone Encounter (Signed)
Called pt.  Refills sent in for warfarin and lasix.

## 2017-05-05 NOTE — Telephone Encounter (Signed)
Please give pt a call  °

## 2017-05-12 ENCOUNTER — Ambulatory Visit (INDEPENDENT_AMBULATORY_CARE_PROVIDER_SITE_OTHER): Payer: Medicare Other | Admitting: Cardiovascular Disease

## 2017-05-12 ENCOUNTER — Encounter: Payer: Self-pay | Admitting: Cardiovascular Disease

## 2017-05-12 ENCOUNTER — Ambulatory Visit (INDEPENDENT_AMBULATORY_CARE_PROVIDER_SITE_OTHER): Payer: Medicare Other | Admitting: *Deleted

## 2017-05-12 VITALS — BP 118/68 | HR 86 | Ht 66.0 in | Wt 202.0 lb

## 2017-05-12 DIAGNOSIS — Z79899 Other long term (current) drug therapy: Secondary | ICD-10-CM

## 2017-05-12 DIAGNOSIS — I10 Essential (primary) hypertension: Secondary | ICD-10-CM | POA: Diagnosis not present

## 2017-05-12 DIAGNOSIS — I77810 Thoracic aortic ectasia: Secondary | ICD-10-CM

## 2017-05-12 DIAGNOSIS — Z5181 Encounter for therapeutic drug level monitoring: Secondary | ICD-10-CM | POA: Diagnosis not present

## 2017-05-12 DIAGNOSIS — I4891 Unspecified atrial fibrillation: Secondary | ICD-10-CM | POA: Diagnosis not present

## 2017-05-12 DIAGNOSIS — I5022 Chronic systolic (congestive) heart failure: Secondary | ICD-10-CM | POA: Diagnosis not present

## 2017-05-12 DIAGNOSIS — Q231 Congenital insufficiency of aortic valve: Secondary | ICD-10-CM

## 2017-05-12 LAB — POCT INR: INR: 1.3

## 2017-05-12 MED ORDER — FUROSEMIDE 40 MG PO TABS: ORAL_TABLET | ORAL | 3 refills | Status: DC

## 2017-05-12 MED ORDER — POTASSIUM CHLORIDE CRYS ER 20 MEQ PO TBCR: EXTENDED_RELEASE_TABLET | ORAL | 2 refills | Status: DC

## 2017-05-12 NOTE — Patient Instructions (Addendum)
Your physician wants you to follow-up in: 3 months with Dr.Koneswaran     On Saturday and Sunday Double your Lasix (take 80 mg) and Potassium (take 40 meq)  Doses    Weigh yourself every morning after you have emptied your bladder.IF you have gained 3 lbs or more overnight, you make take an extra lasix 40 mg   One week  BEFORE your next visit in 3 months, get lab work : BMET      No tests ordered today      Thank you for choosing [facility] !

## 2017-05-12 NOTE — Progress Notes (Signed)
SUBJECTIVE: The patient presents for routine follow-up.  She was evaluated in the ED on 03/24/17 for shortness of breath and chest x-ray showed mild CHF.  She was given some IV Lasix and her outpatient dose was increased from 40 mg daily to 60 mg daily.  She was not hospitalized.  Labs on 03/24/17 showed BUN 47, creatinine 1.95, sodium 139, potassium 4.4.  Echocardiogram on 06/25/16 demonstrated mildly reduced left ventricle systolic function with moderate LVH, LVEF 40-45%. Aortic valve leaflets were mildly thickened with possible bicuspid aortic valve. There is mild to moderate aortic regurgitation. There was no aortic stenosis. There is mild-to-moderate mitral regurgitation and moderate tricuspid regurgitation. Right-sided chambers were dilated.  CT angiography of the chest and aorta on 07/24/16 demonstrated dilatation of the ascending aorta to 4 cm. Of note, no significant coronary calcifications were identified.  She has had problems with sodium restriction in the past.  She had a similar episode with bilateral leg edema on 01/27/17.  There was some sodium indiscretion at that time.  She saw Dr. Harl Bowie in the office for follow-up on 02/03/17.  She tells me she takes Lasix 40 mg daily and sometimes takes an extra half tablet if her legs swell.  She said her leg swelling is never fully resolved even after taking Lasix.  She denies orthopnea paroxysmal nocturnal dyspnea.  She tries to avoid salt but has a hard time doing so.    Review of Systems: As per "subjective", otherwise negative.  No Known Allergies  Current Outpatient Medications  Medication Sig Dispense Refill  . aspirin EC 81 MG tablet Take 81 mg by mouth daily.    . carvedilol (COREG) 12.5 MG tablet Take 1 tablet (12.5 mg total) by mouth 2 (two) times daily. 60 tablet 2  . Cholecalciferol (VITAMIN D PO) Take 1 tablet by mouth daily.    . furosemide (LASIX) 40 MG tablet Take 40 mg daily.May take 60 mg daily for 3 days for  swelling 90 tablet 3  . glimepiride (AMARYL) 1 MG tablet Take 1 mg by mouth daily with breakfast.    . potassium chloride SA (K-DUR,KLOR-CON) 20 MEQ tablet Take 1 tablet (20 mEq total) by mouth daily. 30 tablet 2  . warfarin (COUMADIN) 5 MG tablet Take 1 tablet daily except 1 1/2 tablets on Mondays and Thursdays 40 tablet 3   No current facility-administered medications for this visit.     Past Medical History:  Diagnosis Date  . Anxiety   . Atrial fibrillation ([place])   . CHF (congestive heart failure) ([place])   . Diabetes mellitus without complication ([place])   . Hypertension     Past Surgical History:  Procedure Laterality Date  . BACK SURGERY    . EYE SURGERY Left     Social History   Socioeconomic History  . Marital status: Widowed    Spouse name: Not on file  . Number of children: Not on file  . Years of education: Not on file  . Highest education level: Not on file  Occupational History  . Not on file  Social Needs  . Financial resource strain: Not on file  . Food insecurity:    Worry: Not on file    Inability: Not on file  . Transportation needs:    Medical: Not on file    Non-medical: Not on file  Tobacco Use  . Smoking status: Never Smoker  . Smokeless tobacco: Never Used  Substance and Sexual Activity  .  Alcohol use: No  . Drug use: No  . Sexual activity: Not on file  Lifestyle  . Physical activity:    Days per week: Not on file    Minutes per session: Not on file  . Stress: Not on file  Relationships  . Social connections:    Talks on phone: Not on file    Gets together: Not on file    Attends religious service: Not on file    Active member of club or organization: Not on file    Attends meetings of clubs or organizations: Not on file    Relationship status: Not on file  . Intimate partner violence:    Fear of current or ex partner: Not on file    Emotionally abused: Not on file    Physically abused: Not on file    Forced sexual activity: Not on  file  Other Topics Concern  . Not on file  Social History Narrative   Brother-in-law lives with patient.      Vitals:   05/12/17 0933  BP: 118/68  Pulse: 86  SpO2: 97%  Weight: 202 lb (91.6 kg)  Height: 5\' 6"  (1.676 m)    Wt Readings from Last 3 Encounters:  05/12/17 202 lb (91.6 kg)  03/24/17 200 lb (90.7 kg)  02/25/17 200 lb (90.7 kg)     PHYSICAL EXAM General: NAD HEENT: Normal. Neck: No JVD, no thyromegaly. Lungs: Clear to auscultation bilaterally with normal respiratory effort. CV: Regular rate and rhythm, normal S1/S2, no I0/X7,3/5 systolicmurmurover right upper sternal border, left upper and lower sternal borders.   1+ pitting bilateral pretibial and peri-ankle edema.   Abdomen: Soft, nontender, no distention.  Neurologic: Alert and oriented.  Psych: Normal affect. Skin: Normal. Musculoskeletal: No gross deformities.    ECG: Most recent ECG reviewed.   Labs: Lab Results  Component Value Date/Time   K 4.4 03/24/2017 07:12 AM   BUN 47 (H) 03/24/2017 07:12 AM   CREATININE 1.95 (H) 03/24/2017 07:12 AM   CREATININE 1.76 (H) 02/03/2017 11:42 AM   ALT 20 03/24/2017 07:12 AM   TSH 1.932 01/27/2017 12:15 PM   HGB 10.6 (L) 03/24/2017 07:12 AM     Lipids: No results found for: LDLCALC, LDLDIRECT, CHOL, TRIG, HDL     ASSESSMENT AND PLAN:  1. Valvular heart disease: Symptomatically stable. Multifactorial and due to possible bicuspid aortic valve, mild to moderate mitral regurgitation, and moderate tricuspid regurgitation. No evidence of aortic stenosis. Aortic root dilatation is mild, 4 cm.  2. Chronic systolic heart failure, EF 40-45%:She does have some residual lower extremity edema on Lasix 40 mg daily (increases to 60 mg for SOB/leg edema) and carvedilol.  She has not taken Lasix today because she was going out.  No ACE inhibitor's or ARB's due to chronic disease stage III.  I will have her take 80 mg of Lasix and have her double up on her potassium on  Saturdays and Sundays and continue Lasix 40 mg on weekdays.  I told her to weigh herself daily and to take an extra 40 mg of Lasix should weight increased by 3 pounds within 24 hours or 5 pounds in a week.  I will obtain a basic metabolic panel before her next visit. I again educated her on the importance of a low sodium diet. She says she has changed her diet and while it is difficult, she is adhering to it.  3. Persistent atrial fibrillation:Heart rate is controlled on carvedilol 12.5 mg twice  daily. Anticoagulatedwith warfarin. No changes.  4. Hypertension: Controlled. No changes.  5. Aortic root dilatation: Aortic root dilatation is mild, 4 cm (CT detailed above). I will monitor.     Disposition: Follow up 3 months   Kate Sable, M.D., F.A.C.C.

## 2017-05-12 NOTE — Patient Instructions (Signed)
Take coumadin 1 1/2 tablets tonight and tomorrow night then resume 1 tablet daily except 1/2 tablet on Tuesdays and Fridays   Continue greens/salads. Recheck in 2 weeks

## 2017-05-19 ENCOUNTER — Telehealth: Payer: Self-pay | Admitting: Cardiovascular Disease

## 2017-05-19 NOTE — Telephone Encounter (Signed)
Pt has a question about when she should take her furosemide (LASIX) 40 MG tablet [[id number]]  And wants to know if she can choose different days to take it.

## 2017-05-19 NOTE — Telephone Encounter (Signed)
Returned pt call. She wanted to know if she could choose a different 2 days of the week to double her lasix & potassium as Sat & Nancy Fetter are her busiest days. I advised her that as long as she used those 2 days back to back and consistent it would be fine to change those days. She voiced understanding of plan.

## 2017-05-23 ENCOUNTER — Emergency Department (HOSPITAL_COMMUNITY): Payer: Medicare Other

## 2017-05-23 ENCOUNTER — Other Ambulatory Visit: Payer: Self-pay

## 2017-05-23 ENCOUNTER — Emergency Department (HOSPITAL_COMMUNITY)
Admission: EM | Admit: 2017-05-23 | Discharge: 2017-05-23 | Disposition: A | Discharge status: Home / Self Care | Payer: Medicare Other | Attending: Emergency Medicine | Admitting: Emergency Medicine

## 2017-05-23 ENCOUNTER — Encounter (HOSPITAL_COMMUNITY): Payer: Self-pay | Admitting: Emergency Medicine

## 2017-05-23 DIAGNOSIS — E119 Type 2 diabetes mellitus without complications: Secondary | ICD-10-CM | POA: Diagnosis not present

## 2017-05-23 DIAGNOSIS — I509 Heart failure, unspecified: Secondary | ICD-10-CM | POA: Insufficient documentation

## 2017-05-23 DIAGNOSIS — I130 Hypertensive heart and chronic kidney disease with heart failure and stage 1 through stage 4 chronic kidney disease, or unspecified chronic kidney disease: Secondary | ICD-10-CM | POA: Insufficient documentation

## 2017-05-23 DIAGNOSIS — Z79899 Other long term (current) drug therapy: Secondary | ICD-10-CM | POA: Diagnosis not present

## 2017-05-23 DIAGNOSIS — N183 Chronic kidney disease, stage 3 (moderate): Secondary | ICD-10-CM | POA: Insufficient documentation

## 2017-05-23 DIAGNOSIS — Z7901 Long term (current) use of anticoagulants: Secondary | ICD-10-CM | POA: Diagnosis not present

## 2017-05-23 DIAGNOSIS — R0602 Shortness of breath: Secondary | ICD-10-CM | POA: Insufficient documentation

## 2017-05-23 DIAGNOSIS — Z7982 Long term (current) use of aspirin: Secondary | ICD-10-CM | POA: Diagnosis not present

## 2017-05-23 LAB — CBC WITH DIFFERENTIAL/PLATELET
Basophils Absolute: 0 10*3/uL (ref 0.0–0.1)
Basophils Relative: 0 %
EOS PCT: 2 %
Eosinophils Absolute: 0.1 10*3/uL (ref 0.0–0.7)
HCT: 38.4 % (ref 36.0–46.0)
Hemoglobin: 11.4 g/dL — ABNORMAL LOW (ref 12.0–15.0)
LYMPHS ABS: 0.8 10*3/uL (ref 0.7–4.0)
LYMPHS PCT: 15 %
MCH: 30 pg (ref 26.0–34.0)
MCHC: 29.7 g/dL — ABNORMAL LOW (ref 30.0–36.0)
MCV: 101.1 fL — AB (ref 78.0–100.0)
Monocytes Absolute: 0.5 10*3/uL (ref 0.1–1.0)
Monocytes Relative: 10 %
Neutro Abs: 3.9 10*3/uL (ref 1.7–7.7)
Neutrophils Relative %: 73 %
Platelets: 199 10*3/uL (ref 150–400)
RBC: 3.8 MIL/uL — AB (ref 3.87–5.11)
RDW: 17.6 % — ABNORMAL HIGH (ref 11.5–15.5)
WBC: 5.3 10*3/uL (ref 4.0–10.5)

## 2017-05-23 LAB — COMPREHENSIVE METABOLIC PANEL
ALK PHOS: 81 U/L (ref 38–126)
ALT: 16 U/L (ref 14–54)
AST: 24 U/L (ref 15–41)
Albumin: 4 g/dL (ref 3.5–5.0)
Anion gap: 11 (ref 5–15)
BILIRUBIN TOTAL: 1.7 mg/dL — AB (ref 0.3–1.2)
BUN: 49 mg/dL — ABNORMAL HIGH (ref 6–20)
CALCIUM: 9.5 mg/dL (ref 8.9–10.3)
CO2: 28 mmol/L (ref 22–32)
CREATININE: 1.93 mg/dL — AB (ref 0.44–1.00)
Chloride: 106 mmol/L (ref 101–111)
GFR, EST AFRICAN AMERICAN: 27 mL/min — AB (ref >60)
GFR, EST NON AFRICAN AMERICAN: 23 mL/min — AB (ref >60)
Glucose, Bld: 189 mg/dL — ABNORMAL HIGH (ref 65–99)
Potassium: 4.1 mmol/L (ref 3.5–5.1)
Sodium: 145 mmol/L (ref 135–145)
TOTAL PROTEIN: 7.6 g/dL (ref 6.5–8.1)

## 2017-05-23 LAB — TROPONIN I
TROPONIN I: 0.03 ng/mL — AB (ref <0.03)
Troponin I: 0.03 ng/mL (ref <0.03)

## 2017-05-23 LAB — PROTIME-INR
INR: 1.6
PROTHROMBIN TIME: 19 s — AB (ref 11.4–15.2)

## 2017-05-23 LAB — BRAIN NATRIURETIC PEPTIDE: B NATRIURETIC PEPTIDE 5: 970 pg/mL — AB (ref 0.0–100.0)

## 2017-05-23 NOTE — ED Notes (Signed)
Pt in xray. Radiology will bring patient over when xray is complete.

## 2017-05-23 NOTE — Discharge Instructions (Addendum)
Return for new or worse symptoms.  Make an appointment to follow-up with your doctors including your cardiologist.  Continue your current medications.

## 2017-05-23 NOTE — ED Provider Notes (Signed)
EKG Atrial fibrillation, rate 94 Left bundle branch block No prior EKGs for comparison   Dorie Rank, MD 05/23/17 1141

## 2017-05-23 NOTE — ED Provider Notes (Signed)
[facility] EMERGENCY DEPARTMENT Provider Note   CSN: [id number] Arrival date & time: 05/23/17  1101     History   Chief Complaint Chief Complaint  Patient presents with  . Shortness of Breath    HPI Barbara Stone is a 82 y.o. female.  Patient followed by [name] as well as cardiology locally.  Patient with a history of congestive heart failure as well as atrial fib.  States she is still taking her Coumadin.  Initial complaint from triage with shortness of breath the patient stated she just could not sleep last night is not really sure if she was short of breath or not denies any chest pain.  Patient was seen in February 27 for shortness of breath final diagnosis was systolic congestive heart failure.  She was not admitted at that time.  Last admission was in February 2018.  Patient is hard of hearing and did not wear her hearing aids.     Past Medical History:  Diagnosis Date  . Anxiety   . Atrial fibrillation ([place])   . CHF (congestive heart failure) ([place])   . Diabetes mellitus without complication ([place])   . Hypertension     Patient Active Problem List   Diagnosis Date Noted  . IDA (iron deficiency anemia) 12/14/2016  . Acute respiratory failure with hypoxemia ([place]) 03/19/2016  . Acute systolic CHF (congestive heart failure) ([place]) 03/19/2016  . HTN (hypertension) 03/19/2016  . CKD (chronic kidney disease) stage 3, GFR 30-59 ml/min (HCC) 03/19/2016  . Atrial fibrillation ([place]) [I48.91] 03/09/2016  . Encounter for therapeutic drug monitoring 03/09/2016    Past Surgical History:  Procedure Laterality Date  . BACK SURGERY    . EYE SURGERY Left      OB History   None      Home Medications    Prior to Admission medications   Medication Sig Start Date End Date Taking? Authorizing Provider  aspirin EC 81 MG tablet Take 81 mg by mouth daily.   Yes [provider]  carvedilol (COREG) 12.5 MG tablet Take 1 tablet (12.5 mg total) by mouth 2 (two) times  daily. 03/22/16  Yes Isaac Bliss, Rayford Halsted, MD  Cholecalciferol (VITAMIN D PO) Take 1 tablet by mouth daily.   Yes [provider]  furosemide (LASIX) 40 MG tablet Take 40 mg daily EXCEPT on Saturday and Sunday, take 80 MG ( 2 pills)  May take an extra 40 mg if you gain more than 3 lbs overnight 05/12/17  Yes Herminio Commons, MD  glimepiride (AMARYL) 1 MG tablet Take 1 mg by mouth daily with breakfast.   Yes [provider]  potassium chloride SA (K-DUR,KLOR-CON) 20 MEQ tablet Take 20 meq daily EXCEPT on Saturday and Sunday, take 40 meq (2 pills) Patient taking differently: Take 20 mEq by mouth daily. Take 20 meq daily EXCEPT on Saturday and Sunday, take 40 meq (2 pills) 05/12/17  Yes Herminio Commons, MD  warfarin (COUMADIN) 5 MG tablet Take 1 tablet daily except 1 1/2 tablets on Mondays and Thursdays Patient taking differently: Take 5 mg by mouth daily. Take 1 tablet daily except 1 1/2 tablets on Tuesdays and Fridays 05/05/17  Yes Branch, Alphonse Guild, MD    Family History Family History  Problem Relation Age of Onset  . Colon cancer Neg Hx     Social History Social History   Tobacco Use  . Smoking status: Never Smoker  . Smokeless tobacco: Never Used  Substance Use Topics  .  Alcohol use: No  . Drug use: No     Allergies   Patient has no known allergies.   Review of Systems Review of Systems  Constitutional: Negative for fever.  HENT: Positive for hearing loss. Negative for congestion.   Eyes: Negative for visual disturbance.  Respiratory: Negative for shortness of breath.   Cardiovascular: Negative for chest pain.  Gastrointestinal: Negative for abdominal pain.  Genitourinary: Negative for dysuria.  Skin: Negative for rash.  Neurological: Negative for syncope.  Hematological: Bruises/bleeds easily.  Psychiatric/Behavioral: Positive for sleep disturbance.     Physical Exam Updated Vital Signs BP 110/77   Pulse 82   Temp 98.6 F (37 C)  (Oral)   Resp 18   Ht 1.676 m (5\' 6" )   Wt 91.6 kg (202 lb)   SpO2 100%   BMI 32.60 kg/m   Physical Exam  Constitutional: She is oriented to person, place, and time. She appears well-developed and well-nourished. No distress.  HENT:  Head: Normocephalic and atraumatic.  Mouth/Throat: Oropharynx is clear and moist.  Eyes: Pupils are equal, round, and reactive to light. Conjunctivae and EOM are normal.  Neck: Neck supple.  Cardiovascular: Normal rate.  Murmur heard. Irregular, questionable murmur  Pulmonary/Chest: Effort normal and breath sounds normal. She has no wheezes.  Abdominal: Soft. Bowel sounds are normal. There is no tenderness.  Musculoskeletal: She exhibits edema.  Trace lower extremity edema, bilaterally  Neurological: She is alert and oriented to person, place, and time. A cranial nerve deficit is present. No sensory deficit. She exhibits normal muscle tone. Coordination normal.  Hearing deficit  Skin: Skin is warm.  Nursing note and vitals reviewed.    ED Treatments / Results  Labs (all labs ordered are listed, but only abnormal results are displayed) Labs Reviewed  TROPONIN I - Abnormal; Notable for the following components:      Result Value   Troponin I 0.03 (*)    All other components within normal limits  CBC WITH DIFFERENTIAL/PLATELET - Abnormal; Notable for the following components:   RBC 3.80 (*)    Hemoglobin 11.4 (*)    MCV 101.1 (*)    MCHC 29.7 (*)    RDW 17.6 (*)    All other components within normal limits  COMPREHENSIVE METABOLIC PANEL - Abnormal; Notable for the following components:   Glucose, Bld 189 (*)    BUN 49 (*)    Creatinine, Ser 1.93 (*)    Total Bilirubin 1.7 (*)    GFR calc non Af Amer 23 (*)    GFR calc Af Amer 27 (*)    All other components within normal limits  BRAIN NATRIURETIC PEPTIDE - Abnormal; Notable for the following components:   B Natriuretic Peptide 970.0 (*)    All other components within normal limits    PROTIME-INR - Abnormal; Notable for the following components:   Prothrombin Time 19.0 (*)    All other components within normal limits  TROPONIN I - Abnormal; Notable for the following components:   Troponin I 0.03 (*)    All other components within normal limits    EKG EKG Interpretation  Date/Time:  Sunday May 23 2017 12:44:43 EDT Ventricular Rate:  79 PR Interval:    QRS Duration: 130 QT Interval:  399 QTC Calculation: 458 R Axis:   -65 Text Interpretation:  Atrial fibrillation Left bundle branch block Confirmed by Fredia Sorrow [phone]) on 05/23/2017 1:18:06 PM   Radiology Dg Chest 2 View  Result Date: 05/23/2017 CLINICAL  DATA:  Shortness of breath beginning yesterday. Congestive heart failure. Atrial fibrillation. EXAM: CHEST - 2 VIEW COMPARISON:  03/24/2017 FINDINGS: Mild cardiomegaly remains stable. Stable thoracic aortic ectasia and atherosclerotic calcification. Stable mild scarring in left lung base. No evidence of pulmonary infiltrate or edema. No evidence of pleural effusion. IMPRESSION: Stable cardiomegaly and mild left basilar scarring. No active lung disease. Electronically Signed   By: Earle Gell M.D.   On: 05/23/2017 12:53   Ct Chest Wo Contrast  Result Date: 05/23/2017 CLINICAL DATA:  Shortness of breath that started last night EXAM: CT CHEST WITHOUT CONTRAST TECHNIQUE: Multidetector CT imaging of the chest was performed following the standard protocol without IV contrast. COMPARISON:  07/24/2016 FINDINGS: Cardiovascular: Cardiomegaly without pericardial effusion. Enlargement of the main pulmonary artery up to 3.6 cm. 4 cm diameter ascending aortic aneurysm. Mediastinum/Nodes: Negative for adenopathy or mass. Lungs/Pleura: Best seen on reformats there is interlobular septal thickening mainly at the bases. There is no consolidation, effusion, or pneumothorax. Upper Abdomen: Atherosclerotic calcification.  No acute finding. Musculoskeletal: Diffuse disc degeneration  and spondylosis. Left more than right glenohumeral osteoarthritis. T8 body hemangioma that is stable. IMPRESSION: 1. Probable early interstitial edema. 2. Chronic cardiomegaly. 3. 4 cm fusiform ascending aortic aneurysm. No change from 07/24/2016. If appropriate for comorbidities, recommend annual imaging followup by CTA or MRA. This recommendation follows 2010 ACCF/AHA/AATS/ACR/ASA/SCA/SCAI/SIR/STS/SVM Guidelines for the Diagnosis and Management of Patients with Thoracic Aortic Disease. Circulation. 2010; 121: B151-V616 Electronically Signed   By: Monte Fantasia M.D.   On: 05/23/2017 15:55    Procedures Procedures (including critical care time)  Medications Ordered in ED Medications - No data to display   Initial Impression / Assessment and Plan / ED Course  I have reviewed the triage vital signs and the nursing notes.  Pertinent labs & imaging results that were available during my care of the patient were reviewed by me and considered in my medical decision making (see chart for details).     Initial EKG did not crossover.  But it had a rate of 96 consistent with atrial fib and a bundle branch block.  Nothing to compare will have them repeat that so we can get one in the MUSE.  Patient be placed on cardiac monitor.  Will check troponin INR and other basic labs.  Work-up without any acute findings other than CT scan showing some mild interstitial edema.  Patient always has elevated troponin delta troponin remained the same.  BNP is elevated patient in no respiratory distress not hypoxic did not have any chest pain.  Patient is most likely baseline for her congestive heart failure.  Recommend following up with her cardiologist and her primary care doctor and returning for any new or worse symptoms.  Final Clinical Impressions(s) / ED Diagnoses   Final diagnoses:  SOB (shortness of breath)    ED Discharge Orders    None       Fredia Sorrow, MD 05/23/17 1649

## 2017-05-23 NOTE — ED Notes (Signed)
Date and time results received: 05/23/17 1415 (use smartphrase ".now" to insert current time)  Test: troponin Critical Value: 0.03  Name of Provider Notified: sz  Orders Received? Or Actions Taken?: none at this time

## 2017-05-23 NOTE — ED Triage Notes (Signed)
Patient c/o shortness of breath that started last night. Denies any cough or chest pain. Patient has hx of afib and CHF. Per patient some swelling in legs bilaterally.

## 2017-05-31 ENCOUNTER — Ambulatory Visit (INDEPENDENT_AMBULATORY_CARE_PROVIDER_SITE_OTHER): Payer: Medicare Other | Admitting: *Deleted

## 2017-05-31 DIAGNOSIS — I4891 Unspecified atrial fibrillation: Secondary | ICD-10-CM

## 2017-05-31 DIAGNOSIS — Z5181 Encounter for therapeutic drug level monitoring: Secondary | ICD-10-CM

## 2017-05-31 LAB — POCT INR: INR: 1.7

## 2017-05-31 NOTE — Patient Instructions (Signed)
Increase coumadin to 1 tablet daily Continue greens/salads.  Recheck in 2 weeks

## 2017-06-16 ENCOUNTER — Ambulatory Visit (INDEPENDENT_AMBULATORY_CARE_PROVIDER_SITE_OTHER): Payer: Medicare Other | Admitting: *Deleted

## 2017-06-16 DIAGNOSIS — Z5181 Encounter for therapeutic drug level monitoring: Secondary | ICD-10-CM

## 2017-06-16 DIAGNOSIS — I4891 Unspecified atrial fibrillation: Secondary | ICD-10-CM

## 2017-06-16 LAB — POCT INR: INR: 3 (ref 2.0–3.0)

## 2017-06-16 NOTE — Patient Instructions (Signed)
Continue coumadin 1 tablet daily Continue greens/salads.  Recheck in 4 weeks

## 2017-06-27 ENCOUNTER — Emergency Department (HOSPITAL_COMMUNITY): Payer: Medicare Other

## 2017-06-27 ENCOUNTER — Inpatient Hospital Stay (HOSPITAL_COMMUNITY)
Admission: EM | Admit: 2017-06-27 | Discharge: 2017-06-28 | DRG: 189 | Disposition: A | Discharge status: Home / Self Care | Payer: Medicare Other | Attending: Internal Medicine | Admitting: Internal Medicine

## 2017-06-27 ENCOUNTER — Other Ambulatory Visit: Payer: Self-pay

## 2017-06-27 ENCOUNTER — Encounter (HOSPITAL_COMMUNITY): Payer: Self-pay | Admitting: Emergency Medicine

## 2017-06-27 DIAGNOSIS — J189 Pneumonia, unspecified organism: Secondary | ICD-10-CM | POA: Diagnosis present

## 2017-06-27 DIAGNOSIS — Z7901 Long term (current) use of anticoagulants: Secondary | ICD-10-CM | POA: Diagnosis not present

## 2017-06-27 DIAGNOSIS — I130 Hypertensive heart and chronic kidney disease with heart failure and stage 1 through stage 4 chronic kidney disease, or unspecified chronic kidney disease: Secondary | ICD-10-CM | POA: Diagnosis not present

## 2017-06-27 DIAGNOSIS — N179 Acute kidney failure, unspecified: Secondary | ICD-10-CM | POA: Diagnosis present

## 2017-06-27 DIAGNOSIS — Z7984 Long term (current) use of oral hypoglycemic drugs: Secondary | ICD-10-CM

## 2017-06-27 DIAGNOSIS — I4891 Unspecified atrial fibrillation: Secondary | ICD-10-CM | POA: Diagnosis not present

## 2017-06-27 DIAGNOSIS — I10 Essential (primary) hypertension: Secondary | ICD-10-CM | POA: Diagnosis present

## 2017-06-27 DIAGNOSIS — R7989 Other specified abnormal findings of blood chemistry: Secondary | ICD-10-CM | POA: Diagnosis present

## 2017-06-27 DIAGNOSIS — E1122 Type 2 diabetes mellitus with diabetic chronic kidney disease: Secondary | ICD-10-CM | POA: Diagnosis not present

## 2017-06-27 DIAGNOSIS — Z79899 Other long term (current) drug therapy: Secondary | ICD-10-CM

## 2017-06-27 DIAGNOSIS — J209 Acute bronchitis, unspecified: Secondary | ICD-10-CM | POA: Diagnosis not present

## 2017-06-27 DIAGNOSIS — H9190 Unspecified hearing loss, unspecified ear: Secondary | ICD-10-CM | POA: Diagnosis present

## 2017-06-27 DIAGNOSIS — I5023 Acute on chronic systolic (congestive) heart failure: Secondary | ICD-10-CM | POA: Diagnosis not present

## 2017-06-27 DIAGNOSIS — F419 Anxiety disorder, unspecified: Secondary | ICD-10-CM | POA: Diagnosis present

## 2017-06-27 DIAGNOSIS — I5022 Chronic systolic (congestive) heart failure: Secondary | ICD-10-CM | POA: Diagnosis not present

## 2017-06-27 DIAGNOSIS — Z20828 Contact with and (suspected) exposure to other viral communicable diseases: Secondary | ICD-10-CM | POA: Diagnosis present

## 2017-06-27 DIAGNOSIS — J9621 Acute and chronic respiratory failure with hypoxia: Principal | ICD-10-CM | POA: Diagnosis present

## 2017-06-27 DIAGNOSIS — E119 Type 2 diabetes mellitus without complications: Secondary | ICD-10-CM

## 2017-06-27 DIAGNOSIS — N189 Chronic kidney disease, unspecified: Secondary | ICD-10-CM

## 2017-06-27 DIAGNOSIS — I509 Heart failure, unspecified: Secondary | ICD-10-CM

## 2017-06-27 DIAGNOSIS — N183 Chronic kidney disease, stage 3 (moderate): Secondary | ICD-10-CM | POA: Diagnosis present

## 2017-06-27 DIAGNOSIS — Z7982 Long term (current) use of aspirin: Secondary | ICD-10-CM | POA: Diagnosis not present

## 2017-06-27 DIAGNOSIS — R0602 Shortness of breath: Secondary | ICD-10-CM

## 2017-06-27 DIAGNOSIS — R509 Fever, unspecified: Secondary | ICD-10-CM

## 2017-06-27 LAB — URINALYSIS, ROUTINE W REFLEX MICROSCOPIC
BILIRUBIN URINE: NEGATIVE
Glucose, UA: NEGATIVE mg/dL
Hgb urine dipstick: NEGATIVE
Ketones, ur: NEGATIVE mg/dL
LEUKOCYTES UA: NEGATIVE
Nitrite: NEGATIVE
PH: 5 (ref 5.0–8.0)
Protein, ur: NEGATIVE mg/dL
SPECIFIC GRAVITY, URINE: 1.01 (ref 1.005–1.030)

## 2017-06-27 LAB — CBC WITH DIFFERENTIAL/PLATELET
Basophils Absolute: 0 10*3/uL (ref 0.0–0.1)
Basophils Relative: 0 %
Eosinophils Absolute: 0.1 10*3/uL (ref 0.0–0.7)
Eosinophils Relative: 1 %
HCT: 36.7 % (ref 36.0–46.0)
Hemoglobin: 11.4 g/dL — ABNORMAL LOW (ref 12.0–15.0)
Lymphocytes Relative: 8 %
Lymphs Abs: 0.6 10*3/uL — ABNORMAL LOW (ref 0.7–4.0)
MCH: 30.8 pg (ref 26.0–34.0)
MCHC: 31.1 g/dL (ref 30.0–36.0)
MCV: 99.2 fL (ref 78.0–100.0)
Monocytes Absolute: 0.9 10*3/uL (ref 0.1–1.0)
Monocytes Relative: 12 %
Neutro Abs: 6 10*3/uL (ref 1.7–7.7)
Neutrophils Relative %: 79 %
Platelets: 184 10*3/uL (ref 150–400)
RBC: 3.7 MIL/uL — ABNORMAL LOW (ref 3.87–5.11)
RDW: 15.7 % — ABNORMAL HIGH (ref 11.5–15.5)
WBC: 7.7 10*3/uL (ref 4.0–10.5)

## 2017-06-27 LAB — PROTIME-INR
INR: 2.25
Prothrombin Time: 24.7 seconds — ABNORMAL HIGH (ref 11.4–15.2)

## 2017-06-27 LAB — BASIC METABOLIC PANEL
Anion gap: 11 (ref 5–15)
BUN: 53 mg/dL — ABNORMAL HIGH (ref 6–20)
CO2: 26 mmol/L (ref 22–32)
Calcium: 8.8 mg/dL — ABNORMAL LOW (ref 8.9–10.3)
Chloride: 101 mmol/L (ref 101–111)
Creatinine, Ser: 2.27 mg/dL — ABNORMAL HIGH (ref 0.44–1.00)
GFR calc Af Amer: 22 mL/min — ABNORMAL LOW (ref >60)
GFR calc non Af Amer: 19 mL/min — ABNORMAL LOW (ref >60)
Glucose, Bld: 269 mg/dL — ABNORMAL HIGH (ref 65–99)
Potassium: 4.5 mmol/L (ref 3.5–5.1)
Sodium: 138 mmol/L (ref 135–145)

## 2017-06-27 LAB — BRAIN NATRIURETIC PEPTIDE: B Natriuretic Peptide: 1284 pg/mL — ABNORMAL HIGH (ref 0.0–100.0)

## 2017-06-27 LAB — PROCALCITONIN: PROCALCITONIN: 2.09 ng/mL

## 2017-06-27 LAB — GLUCOSE, CAPILLARY: Glucose-Capillary: 164 mg/dL — ABNORMAL HIGH (ref 65–99)

## 2017-06-27 LAB — LACTIC ACID, PLASMA
Lactic Acid, Venous: 1.1 mmol/L (ref 0.5–1.9)
Lactic Acid, Venous: 0.9 mmol/L (ref 0.5–1.9)

## 2017-06-27 MED ORDER — IPRATROPIUM-ALBUTEROL 0.5-2.5 (3) MG/3ML IN SOLN
3.0000 mL | Freq: Four times a day (QID) | RESPIRATORY_TRACT | Status: DC
  Administered 2017-06-28 (×2): 3 mL via RESPIRATORY_TRACT
  Filled 2017-06-27 (×3): qty 3

## 2017-06-27 MED ORDER — SODIUM CHLORIDE 0.9% FLUSH
3.0000 mL | Freq: Two times a day (BID) | INTRAVENOUS | Status: DC
  Administered 2017-06-27 – 2017-06-28 (×2): 3 mL via INTRAVENOUS

## 2017-06-27 MED ORDER — FUROSEMIDE 10 MG/ML IJ SOLN
40.0000 mg | Freq: Once | INTRAMUSCULAR | Status: AC
  Administered 2017-06-27: 40 mg via INTRAVENOUS
  Filled 2017-06-27: qty 4

## 2017-06-27 MED ORDER — INSULIN ASPART 100 UNIT/ML ~~LOC~~ SOLN
0.0000 [IU] | Freq: Three times a day (TID) | SUBCUTANEOUS | Status: DC
  Administered 2017-06-28 (×2): 3 [IU] via SUBCUTANEOUS

## 2017-06-27 MED ORDER — LORAZEPAM 0.5 MG PO TABS: 0.5000 mg | ORAL_TABLET | Freq: Two times a day (BID) | ORAL | Status: DC | PRN

## 2017-06-27 MED ORDER — SODIUM CHLORIDE 0.9 % IV SOLN
500.0000 mg | INTRAVENOUS | Status: DC
  Filled 2017-06-27: qty 500

## 2017-06-27 MED ORDER — INSULIN ASPART 100 UNIT/ML ~~LOC~~ SOLN: 0.0000 [IU] | Freq: Every day | SUBCUTANEOUS | Status: DC

## 2017-06-27 MED ORDER — ACETAMINOPHEN 325 MG PO TABS: 650.0000 mg | ORAL_TABLET | ORAL | Status: DC | PRN

## 2017-06-27 MED ORDER — SODIUM CHLORIDE 0.9% FLUSH: 3.0000 mL | INTRAVENOUS | Status: DC | PRN

## 2017-06-27 MED ORDER — CARVEDILOL 12.5 MG PO TABS
12.5000 mg | ORAL_TABLET | Freq: Two times a day (BID) | ORAL | Status: DC
  Administered 2017-06-27 – 2017-06-28 (×2): 12.5 mg via ORAL
  Filled 2017-06-27 (×2): qty 1

## 2017-06-27 MED ORDER — WARFARIN SODIUM 5 MG PO TABS
5.0000 mg | ORAL_TABLET | Freq: Once | ORAL | Status: AC
  Administered 2017-06-27: 5 mg via ORAL
  Filled 2017-06-27: qty 1

## 2017-06-27 MED ORDER — ASPIRIN EC 81 MG PO TBEC
81.0000 mg | DELAYED_RELEASE_TABLET | Freq: Every day | ORAL | Status: DC
  Administered 2017-06-28: 81 mg via ORAL
  Filled 2017-06-27 (×2): qty 1

## 2017-06-27 MED ORDER — AZITHROMYCIN 250 MG PO TABS
500.0000 mg | ORAL_TABLET | Freq: Once | ORAL | Status: AC
  Administered 2017-06-27: 500 mg via ORAL
  Filled 2017-06-27: qty 2

## 2017-06-27 MED ORDER — WARFARIN - PHARMACIST DOSING INPATIENT: Freq: Every day | Status: DC

## 2017-06-27 MED ORDER — ONDANSETRON HCL 4 MG/2ML IJ SOLN: 4.0000 mg | Freq: Four times a day (QID) | INTRAMUSCULAR | Status: DC | PRN

## 2017-06-27 MED ORDER — FERROUS SULFATE 325 (65 FE) MG PO TABS
325.0000 mg | ORAL_TABLET | Freq: Two times a day (BID) | ORAL | Status: DC
  Administered 2017-06-27 – 2017-06-28 (×2): 325 mg via ORAL
  Filled 2017-06-27 (×2): qty 1

## 2017-06-27 MED ORDER — SODIUM CHLORIDE 0.9 % IV SOLN
1.0000 g | INTRAVENOUS | Status: DC
  Filled 2017-06-27: qty 10

## 2017-06-27 MED ORDER — LATANOPROST 0.005 % OP SOLN
1.0000 [drp] | Freq: Every day | OPHTHALMIC | Status: DC
  Administered 2017-06-27: 1 [drp] via OPHTHALMIC
  Filled 2017-06-27 (×2): qty 2.5

## 2017-06-27 MED ORDER — IPRATROPIUM BROMIDE 0.02 % IN SOLN
RESPIRATORY_TRACT | Status: AC
  Administered 2017-06-27: 0.5 mg
  Filled 2017-06-27: qty 2.5

## 2017-06-27 MED ORDER — METOLAZONE 5 MG PO TABS
5.0000 mg | ORAL_TABLET | Freq: Every day | ORAL | Status: DC
  Administered 2017-06-27 – 2017-06-28 (×2): 5 mg via ORAL
  Filled 2017-06-27 (×2): qty 1

## 2017-06-27 MED ORDER — FUROSEMIDE 10 MG/ML IJ SOLN
60.0000 mg | Freq: Two times a day (BID) | INTRAMUSCULAR | Status: DC
  Filled 2017-06-27: qty 6

## 2017-06-27 MED ORDER — SODIUM CHLORIDE 0.9 % IV SOLN
2.0000 g | Freq: Once | INTRAVENOUS | Status: AC
  Administered 2017-06-27: 2 g via INTRAVENOUS
  Filled 2017-06-27: qty 20

## 2017-06-27 MED ORDER — AZITHROMYCIN 500 MG IV SOLR
500.0000 mg | INTRAVENOUS | Status: DC
  Filled 2017-06-27: qty 500

## 2017-06-27 MED ORDER — ACETAMINOPHEN 500 MG PO TABS
1000.0000 mg | ORAL_TABLET | Freq: Once | ORAL | Status: AC
  Administered 2017-06-27: 1000 mg via ORAL
  Filled 2017-06-27: qty 2

## 2017-06-27 MED ORDER — GLIMEPIRIDE 2 MG PO TABS
1.0000 mg | ORAL_TABLET | Freq: Every day | ORAL | Status: DC
  Administered 2017-06-28: 1 mg via ORAL
  Filled 2017-06-27: qty 1

## 2017-06-27 MED ORDER — SODIUM CHLORIDE 0.9 % IV SOLN: 250.0000 mL | INTRAVENOUS | Status: DC | PRN

## 2017-06-27 MED ORDER — BRIMONIDINE TARTRATE-TIMOLOL 0.2-0.5 % OP SOLN
1.0000 [drp] | Freq: Two times a day (BID) | OPHTHALMIC | Status: DC
  Filled 2017-06-27: qty 5

## 2017-06-27 MED ORDER — POTASSIUM CHLORIDE CRYS ER 20 MEQ PO TBCR
40.0000 meq | EXTENDED_RELEASE_TABLET | Freq: Once | ORAL | Status: AC
  Administered 2017-06-27: 40 meq via ORAL
  Filled 2017-06-27: qty 2

## 2017-06-27 MED ORDER — FUROSEMIDE 10 MG/ML IJ SOLN
20.0000 mg | Freq: Once | INTRAMUSCULAR | Status: AC
  Administered 2017-06-27: 20 mg via INTRAVENOUS
  Filled 2017-06-27: qty 2

## 2017-06-27 MED ORDER — ALBUTEROL SULFATE (2.5 MG/3ML) 0.083% IN NEBU
INHALATION_SOLUTION | RESPIRATORY_TRACT | Status: AC
  Administered 2017-06-27: 2.5 mg
  Filled 2017-06-27: qty 3

## 2017-06-27 MED ORDER — POTASSIUM CHLORIDE CRYS ER 20 MEQ PO TBCR
40.0000 meq | EXTENDED_RELEASE_TABLET | Freq: Two times a day (BID) | ORAL | Status: DC
  Administered 2017-06-27 – 2017-06-28 (×2): 40 meq via ORAL
  Filled 2017-06-27 (×2): qty 2

## 2017-06-27 NOTE — H&P (Addendum)
History and Physical  SEVILLA MURTAGH WPY:[id number] DOB: 1935-08-28 DOA: 06/27/2017  Referring physician: Dr Wilson Singer, ED physician PCP: Lemmie Evens, MD  Outpatient Specialists:   Patient Coming From: home  Chief Complaint: SOB  HPI: Barbara Stone is a 82 y.o. female with a history of systolic heart failure with EF of 40 to 45%, atrial fibrillation rate controlled, diabetes, hypertension, stage III chronic kidney disease.  Patient seen for shortness of breath over the past week that has been worsening.  Shortness of breath worse with exertion and improved with rest.  Has also had increasing weight gain and peripheral edema.  Denies salty food intake.  Feels like she has a "cold".  Does appear that she was prescribed azithromycin over the past couple of days which she started taking.  She denies orthopnea.  No other palliating or provoking factors.  Symptoms are worsening.  She does have nonproductive cough and wheezing.  Emergency Department Course: Patient found to have fever here.  Cardiomegaly on chest x-ray without pulmonary edema.  BNP is elevated to 1200.  Lactic acid normal.  White count normal.  Creatinine elevated to 2.27.  Review of Systems:   Pt denies any chills, nausea, vomiting, diarrhea, constipation, abdominal pain, palpitations, headache, vision changes, lightheadedness, dizziness, melena, rectal bleeding.  Review of systems are otherwise negative  Past Medical History:  Diagnosis Date  . Anxiety   . Atrial fibrillation ([place])   . CHF (congestive heart failure) ([place])   . Diabetes mellitus without complication ([place])   . Hypertension    Past Surgical History:  Procedure Laterality Date  . BACK SURGERY    . EYE SURGERY Left    Social History:  reports that she has never smoked. She has never used smokeless tobacco. She reports that she does not drink alcohol or use drugs. Patient lives at home  No Known Allergies  Family History  Problem Relation Age of Onset  .  Colon cancer Neg Hx       Prior to Admission medications   Medication Sig Start Date End Date Taking? Authorizing Provider  aspirin EC 81 MG tablet Take 81 mg by mouth daily.   Yes [provider]  azithromycin (ZITHROMAX) 250 MG tablet Take 1 tablet by mouth daily. Take 2 for 1st dose then take 1 once daily thereafter finished 06/25/17  Yes [provider]  carvedilol (COREG) 12.5 MG tablet Take 1 tablet (12.5 mg total) by mouth 2 (two) times daily. 03/22/16  Yes Isaac Bliss, Rayford Halsted, MD  Cholecalciferol (VITAMIN D PO) Take 1 tablet by mouth daily.   Yes [provider]  COMBIGAN 0.2-0.5 % ophthalmic solution Place 1 drop into both eyes 2 (two) times daily. 06/22/17  Yes [provider]  FEROSUL 325 (65 Fe) MG tablet Take 1 tablet by mouth 2 (two) times daily. 06/26/17  Yes [provider]  furosemide (LASIX) 40 MG tablet Take 40 mg daily EXCEPT on Saturday and Sunday, take 80 MG ( 2 pills)  May take an extra 40 mg if you gain more than 3 lbs overnight 05/12/17  Yes Herminio Commons, MD  glimepiride (AMARYL) 1 MG tablet Take 1 mg by mouth daily with breakfast.   Yes [provider]  guaiFENesin-codeine 100-10 MG/5ML syrup Take 1 mL by mouth 4 (four) times daily. 06/25/17  Yes [provider]  LORazepam (ATIVAN) 0.5 MG tablet Take 1 tablet by mouth as needed. 06/22/17  Yes [provider]  potassium chloride  SA (K-DUR,KLOR-CON) 20 MEQ tablet Take 20 meq daily EXCEPT on Saturday and Sunday, take 40 meq (2 pills) Patient taking differently: Take 20 mEq by mouth daily. Take 20 meq daily EXCEPT on Saturday and Sunday, take 40 meq (2 pills) 05/12/17  Yes Herminio Commons, MD  TRAVATAN Z 0.004 % SOLN ophthalmic solution Place 1 drop into both eyes at bedtime. 06/22/17  Yes [provider]  warfarin (COUMADIN) 5 MG tablet Take 1 tablet daily except 1 1/2 tablets on Mondays and Thursdays Patient taking differently: Take  5 mg by mouth daily. Take 1 tablet daily except 1 1/2 tablets on Tuesdays and Fridays 05/05/17  Yes Branch, Alphonse Guild, MD    Physical Exam: BP 125/76   Pulse (!) 101   Temp 100.3 F (37.9 C) (Oral)   Resp (!) 34   Ht 5\' 7"  (1.702 m)   Wt 90.7 kg (200 lb)   SpO2 98%   BMI 31.32 kg/m   . General: Elderly Caucasian female. Awake and alert and oriented x3. No acute cardiopulmonary distress.  Marland Kitchen HEENT: Normocephalic atraumatic.  Right and left ears normal in appearance.  Pupils equal, round, reactive to light. Extraocular muscles are intact. Sclerae anicteric and noninjected.  Moist mucosal membranes. No mucosal lesions.  . Neck: Neck supple without lymphadenopathy. No carotid bruits. No masses palpated.  . Cardiovascular: Regular rate with normal S1-S2 sounds. No murmurs, rubs, gallops auscultated. JVD increased to about 10 cm . Respiratory: Prolonged exhalation.  Wheezing throughout.  Rales on the right and left base to mid lung field.  No accessory muscle use. . Abdomen: Soft, nontender, nondistended. Active bowel sounds. No masses or hepatosplenomegaly  . Skin: No rashes, lesions, or ulcerations.  Dry, warm to touch. 2+ dorsalis pedis and radial pulses. . Musculoskeletal: No calf or leg pain. All major joints not erythematous nontender.  No upper or lower joint deformation.  Good ROM.  No contractures  . Psychiatric: Intact judgment and insight. Pleasant and cooperative. . Neurologic: No focal neurological deficits. Strength is 5/5 and symmetric in upper and lower extremities.  Cranial nerves II through XII are grossly intact.           Labs on Admission: I have personally reviewed following labs and imaging studies  CBC: Recent Labs  Lab 06/27/17 1651  WBC 7.7  NEUTROABS 6.0  HGB 11.4*  HCT 36.7  MCV 99.2  PLT 161   Basic Metabolic Panel: Recent Labs  Lab 06/27/17 1651  NA 138  K 4.5  CL 101  CO2 26  GLUCOSE 269*  BUN 53*  CREATININE 2.27*  CALCIUM 8.8*    GFR: Estimated Creatinine Clearance: 22.8 mL/min (A) (by C-G formula based on SCr of 2.27 mg/dL (H)). Liver Function Tests: No results for input(s): AST, ALT, ALKPHOS, BILITOT, PROT, ALBUMIN in the last 168 hours. No results for input(s): LIPASE, AMYLASE in the last 168 hours. No results for input(s): AMMONIA in the last 168 hours. Coagulation Profile: No results for input(s): INR, PROTIME in the last 168 hours. Cardiac Enzymes: No results for input(s): CKTOTAL, CKMB, CKMBINDEX, TROPONINI in the last 168 hours. BNP (last 3 results) No results for input(s): PROBNP in the last 8760 hours. HbA1C: No results for input(s): HGBA1C in the last 72 hours. CBG: No results for input(s): GLUCAP in the last 168 hours. Lipid Profile: No results for input(s): CHOL, HDL, LDLCALC, TRIG, CHOLHDL, LDLDIRECT in the last 72 hours. Thyroid Function Tests: No results for input(s): TSH, T4TOTAL, FREET4,  T3FREE, THYROIDAB in the last 72 hours. Anemia Panel: No results for input(s): VITAMINB12, FOLATE, FERRITIN, TIBC, IRON, RETICCTPCT in the last 72 hours. Urine analysis:    Component Value Date/Time   COLORURINE YELLOW 07/01/2015 1700   APPEARANCEUR CLEAR 07/01/2015 1700   LABSPEC <1.005 (L) 07/01/2015 1700   PHURINE 6.0 07/01/2015 1700   GLUCOSEU NEGATIVE 07/01/2015 1700   HGBUR NEGATIVE 07/01/2015 1700   BILIRUBINUR NEGATIVE 07/01/2015 1700   KETONESUR NEGATIVE 07/01/2015 1700   PROTEINUR NEGATIVE 07/01/2015 1700   UROBILINOGEN 0.2 09/06/2006 1425   NITRITE NEGATIVE 07/01/2015 1700   LEUKOCYTESUR NEGATIVE 07/01/2015 1700   Sepsis Labs: @LABRCNTIP (procalcitonin:4,lacticidven:4) )No results found for this or any previous visit (from the past 240 hour(s)).   Radiological Exams on Admission: Dg Chest 2 View  Result Date: 06/27/2017 CLINICAL DATA:  82 year old presenting with acute onset of shortness of breath. Current history of CHF, hypertension, stage 3 chronic kidney disease. EXAM: CHEST - 2  VIEW COMPARISON:  CT chest 05/23/2017 and earlier. Chest x-ray 05/23/2017, 03/24/2017 and earlier. FINDINGS: AP ERECT and LATERAL images were obtained. Respiratory motion blurred the LATERAL image. Cardiac silhouette markedly enlarged, unchanged. Thoracic aorta atherosclerotic, unchanged. Mild pulmonary venous hypertension, improved since prior examinations, without evidence of pulmonary edema. No confluent airspace consolidation. No visible pleural effusions. Mild degenerative changes involving the thoracic and UPPER lumbar spine. Degenerative changes involving the shoulder joints. IMPRESSION: 1.  No acute cardiopulmonary disease. 2. Stable marked cardiomegaly. Mild pulmonary venous hypertension without overt edema, improved since prior examinations. Electronically Signed   By: Evangeline Dakin M.D.   On: 06/27/2017 17:46    EKG: Independently reviewed.  Atrial for ablation with left bundle branch block.  No acute ST changes  Assessment/Plan: Principal Problem:   Acute on chronic systolic (congestive) heart failure (HCC) Active Problems:   Atrial fibrillation (HCC) [I48.91]   HTN (hypertension)   Acute-on-chronic kidney injury ([place])   Community acquired pneumonia   Diabetes mellitus without complication ([place])    This patient was discussed with the ED physician, including pertinent vitals, physical exam findings, labs, and imaging.  We also discussed care given by the ED provider.  1. Acute on chronic systolic heart failure Telemetry monitoring Strict I/O Daily Weights Diuresis: Lasix 60 mg twice daily IV with metolazone 5 mg daily Potassium: 40 mEq twice a day by mouth Echo cardiac exam tomorrow Repeat BMP tomorrow No ACE inhibitor/ARB due to elevated creatinine Although decompensated, will continue beta-blockers patient is atrial fibrillation 2. Acute bronchitis versus community-acquired pneumonia a. Continue Rocephin and azithromycin b. Procalcitonin c. Respiratory virus  panel d. Sputum culture e. Mucinex f. As the patient is having significant wheezing, will give duo nebs 3. Acute on chronic kidney injury a. We will continue to diurese and monitor creatinine 4. Hypertension a. Continue antihypertensives 5. Atrial fibrillation a. Continue rate controlled 6. Diabetes a. Continue amaryl. b. SSI  DVT prophylaxis: On Coumadin Consultants: None  Code Status:  full code Family Communication: Daughter present during exam and interview Disposition Plan: patient to return home following stabilization of lung pathology   Truett Mainland, DO [facility] Pager [phone]  If 7PM-7AM, please contact night-coverage [email or web address] Password [email or web address]

## 2017-06-27 NOTE — ED Notes (Signed)
Updated on plan of care.  

## 2017-06-27 NOTE — ED Provider Notes (Signed)
[facility] EMERGENCY DEPARTMENT Provider Note   CSN: [id number] Arrival date & time: 06/27/17  1626     History   Chief Complaint Chief Complaint  Patient presents with  . Shortness of Breath    HPI Barbara Stone is a 82 y.o. female.  HPI   82 year old female with shortness of breath.  She is hard of hearing and not a great historian.  The best I can tell her breathing has been worsening over the past few days.  Increasing swelling.  Cough.  PCP was called to prescribe her a cough medication.  Her breathing worsened so she was brought to the emergency room.  Noted to be febrile.  She denies any subjective fevers though.  She arrived in atrial fibrillation with a rapid ventricular rate.  She does have a past history of A. fib.  Past Medical History:  Diagnosis Date  . Anxiety   . Atrial fibrillation ([place])   . CHF (congestive heart failure) ([place])   . Diabetes mellitus without complication ([place])   . Hypertension     Patient Active Problem List   Diagnosis Date Noted  . IDA (iron deficiency anemia) 12/14/2016  . Acute respiratory failure with hypoxemia ([place]) 03/19/2016  . Acute systolic CHF (congestive heart failure) ([place]) 03/19/2016  . HTN (hypertension) 03/19/2016  . CKD (chronic kidney disease) stage 3, GFR 30-59 ml/min (HCC) 03/19/2016  . Atrial fibrillation ([place]) [I48.91] 03/09/2016  . Encounter for therapeutic drug monitoring 03/09/2016    Past Surgical History:  Procedure Laterality Date  . BACK SURGERY    . EYE SURGERY Left      OB History   None      Home Medications    Prior to Admission medications   Medication Sig Start Date End Date Taking? Authorizing Provider  aspirin EC 81 MG tablet Take 81 mg by mouth daily.   Yes [provider]  azithromycin (ZITHROMAX) 250 MG tablet Take 1 tablet by mouth daily. Take 2 for 1st dose then take 1 once daily thereafter finished 06/25/17  Yes [provider]  carvedilol (COREG) 12.5 MG tablet  Take 1 tablet (12.5 mg total) by mouth 2 (two) times daily. 03/22/16  Yes Isaac Bliss, Rayford Halsted, MD  Cholecalciferol (VITAMIN D PO) Take 1 tablet by mouth daily.   Yes [provider]  COMBIGAN 0.2-0.5 % ophthalmic solution Place 1 drop into both eyes 2 (two) times daily. 06/22/17  Yes [provider]  FEROSUL 325 (65 Fe) MG tablet Take 1 tablet by mouth 2 (two) times daily. 06/26/17  Yes [provider]  furosemide (LASIX) 40 MG tablet Take 40 mg daily EXCEPT on Saturday and Sunday, take 80 MG ( 2 pills)  May take an extra 40 mg if you gain more than 3 lbs overnight 05/12/17  Yes Herminio Commons, MD  glimepiride (AMARYL) 1 MG tablet Take 1 mg by mouth daily with breakfast.   Yes [provider]  guaiFENesin-codeine 100-10 MG/5ML syrup Take 1 mL by mouth 4 (four) times daily. 06/25/17  Yes [provider]  LORazepam (ATIVAN) 0.5 MG tablet Take 1 tablet by mouth as needed. 06/22/17  Yes [provider]  potassium chloride SA (K-DUR,KLOR-CON) 20 MEQ tablet Take 20 meq daily EXCEPT on Saturday and Sunday, take 40 meq (2 pills) Patient taking differently: Take 20 mEq by mouth daily. Take 20 meq daily EXCEPT on Saturday and Sunday, take 40 meq (2 pills) 05/12/17  Yes Herminio Commons, MD  TRAVATAN Z 0.004 % SOLN ophthalmic solution Place 1 drop into both eyes at bedtime. 06/22/17  Yes [provider]  warfarin (COUMADIN) 5 MG tablet Take 1 tablet daily except 1 1/2 tablets on Mondays and Thursdays Patient taking differently: Take 5 mg by mouth daily. Take 1 tablet daily except 1 1/2 tablets on Tuesdays and Fridays 05/05/17  Yes Branch, Alphonse Guild, MD    Family History Family History  Problem Relation Age of Onset  . Colon cancer Neg Hx     Social History Social History   Tobacco Use  . Smoking status: Never Smoker  . Smokeless tobacco: Never Used  Substance Use Topics  . Alcohol use: No  . Drug use: No     Allergies     Patient has no known allergies.   Review of Systems Review of Systems All systems reviewed and negative, other than as noted in HPI.   Physical Exam Updated Vital Signs BP 125/76   Pulse (!) 101   Temp 100.3 F (37.9 C) (Oral)   Resp (!) 34   Ht 5\' 7"  (1.702 m)   Wt 90.7 kg (200 lb)   SpO2 98%   BMI 31.32 kg/m   Physical Exam  Constitutional: She appears well-developed and well-nourished. No distress.  HENT:  Head: Normocephalic and atraumatic.  Eyes: Conjunctivae are normal. Right eye exhibits no discharge. Left eye exhibits no discharge.  Neck: Neck supple.  Cardiovascular: Normal heart sounds. Exam reveals no gallop and no friction rub.  No murmur heard. Tachycardic.  Irregularly irregular.  Pulmonary/Chest: Breath sounds normal.  Tachypnea.  Rhonchorous bilaterally, right greater than left.  Abdominal: Soft. She exhibits no distension. There is no tenderness.  Musculoskeletal: She exhibits no tenderness.       Right lower leg: She exhibits edema.  Symmetric lower extremity edema.  Neurological: She is alert.  Skin: Skin is warm and dry.  Psychiatric: She has a normal mood and affect. Her behavior is normal. Thought content normal.  Nursing note and vitals reviewed.    ED Treatments / Results  Labs (all labs ordered are listed, but only abnormal results are displayed) Labs Reviewed  CBC WITH DIFFERENTIAL/PLATELET - Abnormal; Notable for the following components:      Result Value   RBC 3.70 (*)    Hemoglobin 11.4 (*)    RDW 15.7 (*)    Lymphs Abs 0.6 (*)    All other components within normal limits  BASIC METABOLIC PANEL - Abnormal; Notable for the following components:   Glucose, Bld 269 (*)    BUN 53 (*)    Creatinine, Ser 2.27 (*)    Calcium 8.8 (*)    GFR calc non Af Amer 19 (*)    GFR calc Af Amer 22 (*)    All other components within normal limits  BRAIN NATRIURETIC PEPTIDE - Abnormal; Notable for the following components:   B Natriuretic  Peptide 1,284.0 (*)    All other components within normal limits  CULTURE, BLOOD (ROUTINE X 2)  CULTURE, BLOOD (ROUTINE X 2)  LACTIC ACID, PLASMA  URINALYSIS, ROUTINE W REFLEX MICROSCOPIC  LACTIC ACID, PLASMA  PROCALCITONIN    EKG EKG Interpretation  Date/Time:  Sunday June 27 2017 16:29:21 EDT Ventricular Rate:  122 PR Interval:    QRS Duration: 132 QT Interval:  336 QTC Calculation: 479 R Axis:   -73 Text Interpretation:  Atrial fibrillation Ventricular premature complex Left bundle branch block Confirmed by Virgel Manifold [phone]) on 06/27/2017  7:32:09 PM   Radiology Dg Chest 2 View  Result Date: 06/27/2017 CLINICAL DATA:  82 year old presenting with acute onset of shortness of breath. Current history of CHF, hypertension, stage 3 chronic kidney disease. EXAM: CHEST - 2 VIEW COMPARISON:  CT chest 05/23/2017 and earlier. Chest x-ray 05/23/2017, 03/24/2017 and earlier. FINDINGS: AP ERECT and LATERAL images were obtained. Respiratory motion blurred the LATERAL image. Cardiac silhouette markedly enlarged, unchanged. Thoracic aorta atherosclerotic, unchanged. Mild pulmonary venous hypertension, improved since prior examinations, without evidence of pulmonary edema. No confluent airspace consolidation. No visible pleural effusions. Mild degenerative changes involving the thoracic and UPPER lumbar spine. Degenerative changes involving the shoulder joints. IMPRESSION: 1.  No acute cardiopulmonary disease. 2. Stable marked cardiomegaly. Mild pulmonary venous hypertension without overt edema, improved since prior examinations. Electronically Signed   By: Evangeline Dakin M.D.   On: 06/27/2017 17:46    Procedures Procedures (including critical care time)  Medications Ordered in ED Medications  cefTRIAXone (ROCEPHIN) 2 g in sodium chloride 0.9 % 100 mL IVPB (0 g Intravenous Stopped 06/27/17 1739)  azithromycin (ZITHROMAX) tablet 500 mg (500 mg Oral Given 06/27/17 1704)  acetaminophen (TYLENOL)  tablet 1,000 mg (1,000 mg Oral Given 06/27/17 1702)  furosemide (LASIX) injection 40 mg (40 mg Intravenous Given 06/27/17 1848)  potassium chloride SA (K-DUR,KLOR-CON) CR tablet 40 mEq (40 mEq Oral Given 06/27/17 1850)  furosemide (LASIX) injection 20 mg (20 mg Intravenous Given 06/27/17 1850)     Initial Impression / Assessment and Plan / ED Course  I have reviewed the triage vital signs and the nursing notes.  Pertinent labs & imaging results that were available during my care of the patient were reviewed by me and considered in my medical decision making (see chart for details).     82 year old female with cough and shortness of breath.  Clinically volume overloaded.  She sounds rhonchorous on exam, right greater than left.  No focal infiltrate noted on imaging.  She is febrile though.  May be viral illness.  Given her presentation though, will treat for possible bacterial infectious process at this time.  Patient for ongoing treatment.  Discussed with hospitalist, Dr. Nehemiah Settle, for admission.  Final Clinical Impressions(s) / ED Diagnoses   Final diagnoses:  Shortness of breath  Heart failure, unspecified HF chronicity, unspecified heart failure type ([place])  Fever, unspecified fever cause    ED Discharge Orders    None       Virgel Manifold, MD 06/27/17 1932

## 2017-06-27 NOTE — Progress Notes (Signed)
ANTICOAGULATION CONSULT NOTE - Initial Consult  Pharmacy Consult for warfarin Indication: atrial fibrillation  No Known Allergies  Patient Measurements: Height: 5\' 7"  (170.2 cm) Weight: 198 lb 13.7 oz (90.2 kg) IBW/kg (Calculated) : 61.6    Vital Signs: Temp: 100.3 F (37.9 C) (06/02 1846) Temp Source: Oral (06/02 1846) BP: 138/93 (06/02 2125) Pulse Rate: 103 (06/02 2125)  Labs: Recent Labs    06/27/17 1651 06/27/17 2050  HGB 11.4*  --   HCT 36.7  --   PLT 184  --   LABPROT  --  24.7*  INR  --  2.25  CREATININE 2.27*  --     Estimated Creatinine Clearance: 22.8 mL/min (A) (by C-G formula based on SCr of 2.27 mg/dL (H)).   Medical History: Past Medical History:  Diagnosis Date  . Anxiety   . Atrial fibrillation ([place])   . CHF (congestive heart failure) ([place])   . Diabetes mellitus without complication ([place])   . Hypertension     Medications:  Medications Prior to Admission  Medication Sig Dispense Refill Last Dose  . aspirin EC 81 MG tablet Take 81 mg by mouth daily.   06/27/2017 at Unknown time  . azithromycin (ZITHROMAX) 250 MG tablet Take 1 tablet by mouth daily. Take 2 for 1st dose then take 1 once daily thereafter finished  0 06/27/2017 at Unknown time  . carvedilol (COREG) 12.5 MG tablet Take 1 tablet (12.5 mg total) by mouth 2 (two) times daily. 60 tablet 2 06/27/2017 at 9am  . Cholecalciferol (VITAMIN D PO) Take 1 tablet by mouth daily.   06/27/2017 at Unknown time  . COMBIGAN 0.2-0.5 % ophthalmic solution Place 1 drop into both eyes 2 (two) times daily.  6 06/27/2017 at Unknown time  . FEROSUL 325 (65 Fe) MG tablet Take 1 tablet by mouth 2 (two) times daily.  11 06/27/2017 at Unknown time  . furosemide (LASIX) 40 MG tablet Take 40 mg daily EXCEPT on Saturday and Sunday, take 80 MG ( 2 pills)  May take an extra 40 mg if you gain more than 3 lbs overnight 100 tablet 3 06/27/2017 at Unknown time  . glimepiride (AMARYL) 1 MG tablet Take 1 mg by mouth daily with breakfast.    06/27/2017 at Unknown time  . guaiFENesin-codeine 100-10 MG/5ML syrup Take 1 mL by mouth 4 (four) times daily.  0 06/27/2017 at Unknown time  . LORazepam (ATIVAN) 0.5 MG tablet Take 1 tablet by mouth as needed.  5 Past Week at Unknown time  . potassium chloride SA (K-DUR,KLOR-CON) 20 MEQ tablet Take 20 meq daily EXCEPT on Saturday and Sunday, take 40 meq (2 pills) (Patient taking differently: Take 20 mEq by mouth daily. Take 20 meq daily EXCEPT on Saturday and Sunday, take 40 meq (2 pills)) 30 tablet 2 06/27/2017 at Unknown time  . TRAVATAN Z 0.004 % SOLN ophthalmic solution Place 1 drop into both eyes at bedtime.  6 06/26/2017 at Unknown time  . warfarin (COUMADIN) 5 MG tablet Take 1 tablet daily except 1 1/2 tablets on Mondays and Thursdays (Patient taking differently: Take 5 mg by mouth daily. Take 1 tablet daily except 1 1/2 tablets on Tuesdays and Fridays) 40 tablet 3 06/26/2017 at 6pm    Assessment: Pharmacy consulted to dose warfarin for patient with atrial fibrillation. Patient's home dose is latest dosing at anti-coag visit was 5 mg daily.  Goal of Therapy:  INR 2-3 Monitor platelets by anticoagulation protocol: Yes   Plan:  Warfarin 5  mg x 1 dose Monitor daily labs/INR and s/s of bleeding  Ramond Craver 06/27/2017,10:11 PM

## 2017-06-27 NOTE — ED Notes (Signed)
Bot blood culture sets have been obtained

## 2017-06-27 NOTE — ED Triage Notes (Signed)
Pt reports increasing shortness of breath for the past few days.  Pt called her doctor and was given cough medication.  Crackles noted bilaterally on arrival with 2+ pitting edema to lower extremities.

## 2017-06-28 ENCOUNTER — Inpatient Hospital Stay (HOSPITAL_COMMUNITY): Payer: Medicare Other

## 2017-06-28 DIAGNOSIS — I361 Nonrheumatic tricuspid (valve) insufficiency: Secondary | ICD-10-CM

## 2017-06-28 DIAGNOSIS — J209 Acute bronchitis, unspecified: Secondary | ICD-10-CM

## 2017-06-28 DIAGNOSIS — J440 Chronic obstructive pulmonary disease with acute lower respiratory infection: Secondary | ICD-10-CM | POA: Diagnosis not present

## 2017-06-28 DIAGNOSIS — J9621 Acute and chronic respiratory failure with hypoxia: Secondary | ICD-10-CM | POA: Diagnosis not present

## 2017-06-28 DIAGNOSIS — I5023 Acute on chronic systolic (congestive) heart failure: Secondary | ICD-10-CM | POA: Diagnosis not present

## 2017-06-28 DIAGNOSIS — R0602 Shortness of breath: Secondary | ICD-10-CM | POA: Diagnosis not present

## 2017-06-28 LAB — RESPIRATORY PANEL BY PCR
Adenovirus: NOT DETECTED
BORDETELLA PERTUSSIS-RVPCR: NOT DETECTED
CHLAMYDOPHILA PNEUMONIAE-RVPPCR: NOT DETECTED
CORONAVIRUS 229E-RVPPCR: NOT DETECTED
CORONAVIRUS HKU1-RVPPCR: NOT DETECTED
Coronavirus NL63: NOT DETECTED
Coronavirus OC43: NOT DETECTED
INFLUENZA B-RVPPCR: NOT DETECTED
Influenza A: NOT DETECTED
METAPNEUMOVIRUS-RVPPCR: NOT DETECTED
MYCOPLASMA PNEUMONIAE-RVPPCR: NOT DETECTED
PARAINFLUENZA VIRUS 2-RVPPCR: NOT DETECTED
Parainfluenza Virus 1: NOT DETECTED
Parainfluenza Virus 3: DETECTED — AB
Parainfluenza Virus 4: NOT DETECTED
RESPIRATORY SYNCYTIAL VIRUS-RVPPCR: NOT DETECTED
Rhinovirus / Enterovirus: NOT DETECTED

## 2017-06-28 LAB — STREP PNEUMONIAE URINARY ANTIGEN: Strep Pneumo Urinary Antigen: NEGATIVE

## 2017-06-28 LAB — BASIC METABOLIC PANEL
Anion gap: 10 (ref 5–15)
BUN: 58 mg/dL — AB (ref 6–20)
CO2: 28 mmol/L (ref 22–32)
CREATININE: 2.4 mg/dL — AB (ref 0.44–1.00)
Calcium: 8.8 mg/dL — ABNORMAL LOW (ref 8.9–10.3)
Chloride: 102 mmol/L (ref 101–111)
GFR, EST AFRICAN AMERICAN: 21 mL/min — AB (ref >60)
GFR, EST NON AFRICAN AMERICAN: 18 mL/min — AB (ref >60)
Glucose, Bld: 196 mg/dL — ABNORMAL HIGH (ref 65–99)
POTASSIUM: 5.1 mmol/L (ref 3.5–5.1)
Sodium: 140 mmol/L (ref 135–145)

## 2017-06-28 LAB — GLUCOSE, CAPILLARY
GLUCOSE-CAPILLARY: 186 mg/dL — AB (ref 65–99)
Glucose-Capillary: 204 mg/dL — ABNORMAL HIGH (ref 65–99)

## 2017-06-28 LAB — CBC WITH DIFFERENTIAL/PLATELET
BASOS ABS: 0 10*3/uL (ref 0.0–0.1)
BASOS PCT: 0 %
EOS PCT: 0 %
Eosinophils Absolute: 0 10*3/uL (ref 0.0–0.7)
HCT: 37.6 % (ref 36.0–46.0)
Hemoglobin: 11.5 g/dL — ABNORMAL LOW (ref 12.0–15.0)
Lymphocytes Relative: 10 %
Lymphs Abs: 0.8 10*3/uL (ref 0.7–4.0)
MCH: 30.6 pg (ref 26.0–34.0)
MCHC: 30.6 g/dL (ref 30.0–36.0)
MCV: 100 fL (ref 78.0–100.0)
MONO ABS: 0.7 10*3/uL (ref 0.1–1.0)
Monocytes Relative: 10 %
NEUTROS ABS: 5.9 10*3/uL (ref 1.7–7.7)
Neutrophils Relative %: 80 %
PLATELETS: 180 10*3/uL (ref 150–400)
RBC: 3.76 MIL/uL — AB (ref 3.87–5.11)
RDW: 15.9 % — AB (ref 11.5–15.5)
WBC: 7.4 10*3/uL (ref 4.0–10.5)

## 2017-06-28 LAB — ECHOCARDIOGRAM COMPLETE
HEIGHTINCHES: 67 in
Weight: 3164.04 oz

## 2017-06-28 LAB — PROTIME-INR
INR: 2.36
PROTHROMBIN TIME: 25.6 s — AB (ref 11.4–15.2)

## 2017-06-28 MED ORDER — DOXYCYCLINE HYCLATE 100 MG PO CAPS: 100.0000 mg | ORAL_CAPSULE | Freq: Two times a day (BID) | ORAL | 0 refills | Status: AC

## 2017-06-28 MED ORDER — BRIMONIDINE TARTRATE 0.2 % OP SOLN
1.0000 [drp] | Freq: Two times a day (BID) | OPHTHALMIC | Status: DC
  Administered 2017-06-28: 1 [drp] via OPHTHALMIC
  Filled 2017-06-28: qty 5

## 2017-06-28 MED ORDER — FUROSEMIDE 40 MG PO TABS
40.0000 mg | ORAL_TABLET | Freq: Every day | ORAL | Status: DC
  Administered 2017-06-28: 40 mg via ORAL
  Filled 2017-06-28: qty 1

## 2017-06-28 MED ORDER — TIMOLOL MALEATE 0.5 % OP SOLN
1.0000 [drp] | Freq: Two times a day (BID) | OPHTHALMIC | Status: DC
  Filled 2017-06-28: qty 5

## 2017-06-28 MED ORDER — WARFARIN SODIUM 5 MG PO TABS: 5.0000 mg | ORAL_TABLET | Freq: Once | ORAL | Status: DC

## 2017-06-28 MED ORDER — WARFARIN - PHARMACIST DOSING INPATIENT: Freq: Every day | Status: DC

## 2017-06-28 NOTE — Discharge Summary (Signed)
Physician Discharge Summary  Barbara Stone ATF:[id number] DOB: 02-Apr-1935 DOA: 06/27/2017  PCP: Lemmie Evens, MD  Admit date: 06/27/2017 Discharge date: 06/28/2017  Time spent: 45 minutes  Recommendations for Outpatient Follow-up:  -Will be discharged home today. -Advised to follow up with PCP in 2 weeks. -To complete course of doxycycline for presumed acute bronchitis.   Discharge Diagnoses:  Principal Problem:   Acute on chronic systolic (congestive) heart failure (HCC) Active Problems:   Atrial fibrillation (HCC) [I48.91]   HTN (hypertension)   Acute-on-chronic kidney injury ([place])   Community acquired pneumonia   Diabetes mellitus without complication [facility])   Discharge Condition: Stable and improved  Filed Weights   06/27/17 2127 06/28/17 0500 06/28/17 0659  Weight: 90.2 kg (198 lb 13.7 oz) 89.6 kg (197 lb 8.5 oz) 89.7 kg (197 lb 12 oz)    History of present illness:  As per Dr. Nehemiah Settle on 6/2: Barbara Stone is a 82 y.o. female with a history of systolic heart failure with EF of 40 to 45%, atrial fibrillation rate controlled, diabetes, hypertension, stage III chronic kidney disease.  Patient seen for shortness of breath over the past week that has been worsening.  Shortness of breath worse with exertion and improved with rest.  Has also had increasing weight gain and peripheral edema.  Denies salty food intake.  Feels like she has a "cold".  Does appear that she was prescribed azithromycin over the past couple of days which she started taking.  She denies orthopnea.  No other palliating or provoking factors.  Symptoms are worsening.  She does have nonproductive cough and wheezing.  Emergency Department Course: Patient found to have fever here.  Cardiomegaly on chest x-ray without pulmonary edema.  BNP is elevated to 1200.  Lactic acid normal.  White count normal.  Creatinine elevated to 2.27.    Hospital Course:   Acute on Chronic Hypoxemic Respiratory  Failure -After clinical examination and review of labs, xrays and presentation, I do not believe she has acute heart failure. I believe this to be most likely due to acute bronchitis given fever, productive cough and sick family contacts. -Will DC home on doxycycline for 5 days. -No evidence for CAP on CXR.  Rest of chronic medical conditions have been stable during this short hospitalization and her home medications have been changed.  Procedures:  None   Consultations:  None  Discharge Instructions  Discharge Instructions    Diet - low sodium heart healthy   Complete by:  As directed    Increase activity slowly   Complete by:  As directed      Allergies as of 06/28/2017   No Known Allergies     Medication List    STOP taking these medications   azithromycin 250 MG tablet Commonly known as:  ZITHROMAX     TAKE these medications   aspirin EC 81 MG tablet Take 81 mg by mouth daily.   carvedilol 12.5 MG tablet Commonly known as:  COREG Take 1 tablet (12.5 mg total) by mouth 2 (two) times daily.   COMBIGAN 0.2-0.5 % ophthalmic solution Generic drug:  brimonidine-timolol Place 1 drop into both eyes 2 (two) times daily.   doxycycline 100 MG capsule Commonly known as:  VIBRAMYCIN Take 1 capsule (100 mg total) by mouth 2 (two) times daily for 5 days.   FEROSUL 325 (65 FE) MG tablet Generic drug:  ferrous sulfate Take 1 tablet by mouth 2 (two) times daily.   furosemide  40 MG tablet Commonly known as:  LASIX Take 40 mg daily EXCEPT on Saturday and Sunday, take 80 MG ( 2 pills)  May take an extra 40 mg if you gain more than 3 lbs overnight   glimepiride 1 MG tablet Commonly known as:  AMARYL Take 1 mg by mouth daily with breakfast.   guaiFENesin-codeine 100-10 MG/5ML syrup Take 1 mL by mouth 4 (four) times daily.   LORazepam 0.5 MG tablet Commonly known as:  ATIVAN Take 1 tablet by mouth as needed.   potassium chloride SA 20 MEQ tablet Commonly known as:   K-DUR,KLOR-CON Take 20 meq daily EXCEPT on Saturday and Sunday, take 40 meq (2 pills) What changed:    how much to take  how to take this  when to take this  additional instructions   TRAVATAN Z 0.004 % Soln ophthalmic solution Generic drug:  Travoprost (BAK Free) Place 1 drop into both eyes at bedtime.   VITAMIN D PO Take 1 tablet by mouth daily.   warfarin 5 MG tablet Commonly known as:  COUMADIN Take as directed. If you are unsure how to take this medication, talk to your nurse or doctor. Original instructions:  Take 1 tablet daily except 1 1/2 tablets on Mondays and Thursdays What changed:    how much to take  how to take this  when to take this  additional instructions      No Known Allergies Follow-up Information    Lemmie Evens, MD. Schedule an appointment as soon as possible for a visit on 07/12/2017.   Specialty:  Family Medicine Why:  10:20 Contact information: [place] [address] [phone]        Herminio Commons, MD.   Specialty:  Cardiology Contact information: [place] [address] [phone]        Follow up On 07/06/2017.   Why:  1:30 with [place]           The results of significant diagnostics from this hospitalization (including imaging, microbiology, ancillary and laboratory) are listed below for reference.    Significant Diagnostic Studies: Dg Chest 2 View  Result Date: 06/27/2017 CLINICAL DATA:  82 year old presenting with acute onset of shortness of breath. Current history of CHF, hypertension, stage 3 chronic kidney disease. EXAM: CHEST - 2 VIEW COMPARISON:  CT chest 05/23/2017 and earlier. Chest x-ray 05/23/2017, 03/24/2017 and earlier. FINDINGS: AP ERECT and LATERAL images were obtained. Respiratory motion blurred the LATERAL image. Cardiac silhouette markedly enlarged, unchanged. Thoracic aorta atherosclerotic, unchanged. Mild pulmonary venous hypertension, improved since prior  examinations, without evidence of pulmonary edema. No confluent airspace consolidation. No visible pleural effusions. Mild degenerative changes involving the thoracic and UPPER lumbar spine. Degenerative changes involving the shoulder joints. IMPRESSION: 1.  No acute cardiopulmonary disease. 2. Stable marked cardiomegaly. Mild pulmonary venous hypertension without overt edema, improved since prior examinations. Electronically Signed   By: Evangeline Dakin M.D.   On: 06/27/2017 17:46    Microbiology: Recent Results (from the past 240 hour(s))  Blood culture (routine x 2)     Status: None (Preliminary result)   Collection Time: 06/27/17  4:51 PM  Result Value Ref Range Status   Specimen Description BLOOD RIGHT FOREARM  Final   Special Requests   Final    BOTTLES DRAWN AEROBIC AND ANAEROBIC Blood Culture adequate volume   Culture   Final    NO GROWTH < 24 HOURS Performed at [facility], [address],  [place], [place] [address]    Report Status PENDING  Incomplete  Blood culture (routine x 2)     Status: None (Preliminary result)   Collection Time: 06/27/17  4:56 PM  Result Value Ref Range Status   Specimen Description RIGHT ANTECUBITAL  Final   Special Requests   Final    BOTTLES DRAWN AEROBIC AND ANAEROBIC Blood Culture adequate volume   Culture   Final    NO GROWTH < 24 HOURS Performed at [facility], [address], [place],  [address]    Report Status PENDING  Incomplete     Labs: Basic Metabolic Panel: Recent Labs  Lab 06/27/17 1651 06/28/17 0531  NA 138 140  K 4.5 5.1  CL 101 102  CO2 26 28  GLUCOSE 269* 196*  BUN 53* 58*  CREATININE 2.27* 2.40*  CALCIUM 8.8* 8.8*   Liver Function Tests: No results for input(s): AST, ALT, ALKPHOS, BILITOT, PROT, ALBUMIN in the last 168 hours. No results for input(s): LIPASE, AMYLASE in the last 168 hours. No results for input(s): AMMONIA in the last 168 hours. CBC: Recent Labs  Lab 06/27/17 1651 06/28/17 0531  WBC 7.7  7.4  NEUTROABS 6.0 5.9  HGB 11.4* 11.5*  HCT 36.7 37.6  MCV 99.2 100.0  PLT 184 180   Cardiac Enzymes: No results for input(s): CKTOTAL, CKMB, CKMBINDEX, TROPONINI in the last 168 hours. BNP: BNP (last 3 results) Recent Labs    03/24/17 0712 05/23/17 1122 06/27/17 1651  BNP 891.0* 970.0* 1,284.0*    ProBNP (last 3 results) No results for input(s): PROBNP in the last 8760 hours.  CBG: Recent Labs  Lab 06/27/17 2156 06/28/17 0751 06/28/17 1125  GLUCAP 164* 186* 204*       Signed:  Lelon Frohlich  [facility] Pager: [phone] 06/28/2017, 4:40 PM

## 2017-06-28 NOTE — Progress Notes (Signed)
ANTICOAGULATION CONSULT NOTE - Initial Consult  Pharmacy Consult for warfarin Indication: atrial fibrillation  No Known Allergies  Patient Measurements: Height: 5\' 7"  (170.2 cm) Weight: 197 lb 12 oz (89.7 kg) IBW/kg (Calculated) : 61.6    Vital Signs: Temp: 98.4 F (36.9 C) (06/03 0659) Temp Source: Oral (06/03 0659) BP: 134/83 (06/03 0659) Pulse Rate: 80 (06/03 0659)  Labs: Recent Labs    06/27/17 1651 06/27/17 2050 06/28/17 0531  HGB 11.4*  --  11.5*  HCT 36.7  --  37.6  PLT 184  --  180  LABPROT  --  24.7* 25.6*  INR  --  2.25 2.36  CREATININE 2.27*  --  2.40*    Estimated Creatinine Clearance: 21.5 mL/min (A) (by C-G formula based on SCr of 2.4 mg/dL (H)).   Medical History: Past Medical History:  Diagnosis Date  . Anxiety   . Atrial fibrillation ([place])   . CHF (congestive heart failure) ([place])   . Diabetes mellitus without complication ([place])   . Hypertension     Medications:  Medications Prior to Admission  Medication Sig Dispense Refill Last Dose  . aspirin EC 81 MG tablet Take 81 mg by mouth daily.   06/27/2017 at Unknown time  . azithromycin (ZITHROMAX) 250 MG tablet Take 1 tablet by mouth daily. Take 2 for 1st dose then take 1 once daily thereafter finished  0 06/27/2017 at Unknown time  . carvedilol (COREG) 12.5 MG tablet Take 1 tablet (12.5 mg total) by mouth 2 (two) times daily. 60 tablet 2 06/27/2017 at 9am  . Cholecalciferol (VITAMIN D PO) Take 1 tablet by mouth daily.   06/27/2017 at Unknown time  . COMBIGAN 0.2-0.5 % ophthalmic solution Place 1 drop into both eyes 2 (two) times daily.  6 06/27/2017 at Unknown time  . FEROSUL 325 (65 Fe) MG tablet Take 1 tablet by mouth 2 (two) times daily.  11 06/27/2017 at Unknown time  . furosemide (LASIX) 40 MG tablet Take 40 mg daily EXCEPT on Saturday and Sunday, take 80 MG ( 2 pills)  May take an extra 40 mg if you gain more than 3 lbs overnight 100 tablet 3 06/27/2017 at Unknown time  . glimepiride (AMARYL) 1 MG tablet  Take 1 mg by mouth daily with breakfast.   06/27/2017 at Unknown time  . guaiFENesin-codeine 100-10 MG/5ML syrup Take 1 mL by mouth 4 (four) times daily.  0 06/27/2017 at Unknown time  . LORazepam (ATIVAN) 0.5 MG tablet Take 1 tablet by mouth as needed.  5 Past Week at Unknown time  . potassium chloride SA (K-DUR,KLOR-CON) 20 MEQ tablet Take 20 meq daily EXCEPT on Saturday and Sunday, take 40 meq (2 pills) (Patient taking differently: Take 20 mEq by mouth daily. Take 20 meq daily EXCEPT on Saturday and Sunday, take 40 meq (2 pills)) 30 tablet 2 06/27/2017 at Unknown time  . TRAVATAN Z 0.004 % SOLN ophthalmic solution Place 1 drop into both eyes at bedtime.  6 06/26/2017 at Unknown time  . warfarin (COUMADIN) 5 MG tablet Take 1 tablet daily except 1 1/2 tablets on Mondays and Thursdays (Patient taking differently: Take 5 mg by mouth daily. Take 1 tablet daily except 1 1/2 tablets on Tuesdays and Fridays) 40 tablet 3 06/26/2017 at 6pm    Assessment: Pharmacy consulted to dose warfarin for patient with atrial fibrillation. Patient's home dose is latest dosing at anti-coag visit was 5 mg daily. INR thereapeutic at 2.36  Goal of Therapy:  INR 2-3  Monitor platelets by anticoagulation protocol: Yes   Plan:  Warfarin 5 mg x 1 dose Monitor daily labs/INR and s/s of bleeding  Barbara Stone 06/28/2017,10:38 AM

## 2017-06-28 NOTE — Care Management Note (Signed)
Case Management Note  Patient Details  Name: Barbara Stone MRN: [id number] Date of Birth: 1935/11/12  Subjective/Objective:                  Adm with CHF. From home, lives with son in law. Walks with cane, no other DME. Ind with ADL's. Acutely on oxygen. No home health pta.   Action/Plan: CM following for needs. Anticipate weaning to room air.   Expected Discharge Date:  06/30/17               Expected Discharge Plan:     In-House Referral:     Discharge planning Services  CM Consult  Post Acute Care Choice:    Choice offered to:     DME Arranged:    DME Agency:     HH Arranged:    HH Agency:     Status of Service:  In process, will continue to follow  If discussed at Long Length of Stay Meetings, dates discussed:    Additional Comments:  [name], [name], RN 06/28/2017, 9:33 AM

## 2017-06-28 NOTE — Progress Notes (Signed)
*  PRELIMINARY RESULTS* Echocardiogram 2D Echocardiogram has been performed.  Leavy Cella 06/28/2017, 2:30 PM

## 2017-06-29 LAB — HIV ANTIBODY (ROUTINE TESTING W REFLEX): HIV Screen 4th Generation wRfx: NONREACTIVE

## 2017-07-02 LAB — CULTURE, BLOOD (ROUTINE X 2)
Culture: NO GROWTH
Culture: NO GROWTH
Special Requests: ADEQUATE
Special Requests: ADEQUATE

## 2017-07-05 NOTE — Progress Notes (Deleted)
Cardiology Office Note    Date:  07/05/2017   ID:  [name], DOB 03/14/1935, MRN [id number]  PCP:  Barbara Evens, MD  Cardiologist: Barbara Sable, MD    No chief complaint on file.   History of Present Illness:    Barbara Stone is a 82 y.o. female with past medical history of chronic combined systolic and diastolic CHF (EF 95-62% by echo in 05/2016), bicuspid aortic valve, MR, persistent atrial fibrillation (on Coumadin), HTN, and Stage 3 CKD who presents to the office today for hospital follow-up.  She was last examined by Dr. Bronson Stone in 04/2017 and reported worsening lower extremity edema at that time and was taking Lasix 40mg  daily with an extra half tablet as needed based off of her symptoms. It was recommended she take Lasix 40mg  BID on weekends then 40mg  daily on weekdays.   In the interim, she was admitted to [facility] from 6/2 - 06/28/2017 for evaluation of worsening dyspnea on exertion over the past week with associated weight gain and edema. A repeat echo was obtained and showed her EF was further reduced at 25% with mild AI, moderate MR, and moderate TR. BNP was elevated to 1200 and she was started on IV Lasix at the time of admission but due to rapid improvement with antibiotics and her productive cough and fever, it was thought symptoms were most consistent with acute bronchitis and she was treated with Doxycycline for 5 days and continued on her PTA Lasix dosing. Weight was 197 lbs at the time of discharge.   Past Medical History:  Diagnosis Date  . Anxiety   . Atrial fibrillation ([place])   . CHF (congestive heart failure) ([place])   . Diabetes mellitus without complication ([place])   . Hypertension     Past Surgical History:  Procedure Laterality Date  . BACK SURGERY    . EYE SURGERY Left     Current Medications: Outpatient Medications Prior to Visit  Medication Sig Dispense Refill  . aspirin EC 81 MG tablet Take 81 mg by mouth daily.    . carvedilol  (COREG) 12.5 MG tablet Take 1 tablet (12.5 mg total) by mouth 2 (two) times daily. 60 tablet 2  . Cholecalciferol (VITAMIN D PO) Take 1 tablet by mouth daily.    . COMBIGAN 0.2-0.5 % ophthalmic solution Place 1 drop into both eyes 2 (two) times daily.  6  . FEROSUL 325 (65 Fe) MG tablet Take 1 tablet by mouth 2 (two) times daily.  11  . furosemide (LASIX) 40 MG tablet Take 40 mg daily EXCEPT on Saturday and Sunday, take 80 MG ( 2 pills)  May take an extra 40 mg if you gain more than 3 lbs overnight 100 tablet 3  . glimepiride (AMARYL) 1 MG tablet Take 1 mg by mouth daily with breakfast.    . guaiFENesin-codeine 100-10 MG/5ML syrup Take 1 mL by mouth 4 (four) times daily.  0  . LORazepam (ATIVAN) 0.5 MG tablet Take 1 tablet by mouth as needed.  5  . potassium chloride SA (K-DUR,KLOR-CON) 20 MEQ tablet Take 20 meq daily EXCEPT on Saturday and Sunday, take 40 meq (2 pills) (Patient taking differently: Take 20 mEq by mouth daily. Take 20 meq daily EXCEPT on Saturday and Sunday, take 40 meq (2 pills)) 30 tablet 2  . TRAVATAN Z 0.004 % SOLN ophthalmic solution Place 1 drop into both eyes at bedtime.  6  . warfarin (COUMADIN) 5 MG tablet Take 1  tablet daily except 1 1/2 tablets on Mondays and Thursdays (Patient taking differently: Take 5 mg by mouth daily. Take 1 tablet daily except 1 1/2 tablets on Tuesdays and Fridays) 40 tablet 3   No facility-administered medications prior to visit.      Allergies:   Patient has no known allergies.   Social History   Socioeconomic History  . Marital status: Widowed    Spouse name: Not on file  . Number of children: Not on file  . Years of education: Not on file  . Highest education level: Not on file  Occupational History  . Not on file  Social Needs  . Financial resource strain: Not on file  . Food insecurity:    Worry: Not on file    Inability: Not on file  . Transportation needs:    Medical: Not on file    Non-medical: Not on file  Tobacco Use  .  Smoking status: Never Smoker  . Smokeless tobacco: Never Used  Substance and Sexual Activity  . Alcohol use: No  . Drug use: No  . Sexual activity: Not on file  Lifestyle  . Physical activity:    Days per week: Not on file    Minutes per session: Not on file  . Stress: Not on file  Relationships  . Social connections:    Talks on phone: Not on file    Gets together: Not on file    Attends religious service: Not on file    Active member of club or organization: Not on file    Attends meetings of clubs or organizations: Not on file    Relationship status: Not on file  Other Topics Concern  . Not on file  Social History Narrative   Brother-in-law lives with patient.      Family History:  The patient's ***family history is not on file.   Review of Systems:   Please see the history of present illness.     General:  No chills, fever, night sweats or weight changes.  Cardiovascular:  No chest pain, dyspnea on exertion, edema, orthopnea, palpitations, paroxysmal nocturnal dyspnea. Dermatological: No rash, lesions/masses Respiratory: No cough, dyspnea Urologic: No hematuria, dysuria Abdominal:   No nausea, vomiting, diarrhea, bright red blood per rectum, melena, or hematemesis Neurologic:  No visual changes, wkns, changes in mental status. All other systems reviewed and are otherwise negative except as noted above.   Physical Exam:    VS:  There were no vitals taken for this visit.   General: Well developed, well nourished,female appearing in no acute distress. Head: Normocephalic, atraumatic, sclera non-icteric, no xanthomas, nares are without discharge.  Neck: No carotid bruits. JVD not elevated.  Lungs: Respirations regular and unlabored, without wheezes or rales.  Heart: ***Regular rate and rhythm. No S3 or S4.  No murmur, no rubs, or gallops appreciated. Abdomen: Soft, non-tender, non-distended with normoactive bowel sounds. No hepatomegaly. No rebound/guarding. No obvious  abdominal masses. Msk:  Strength and tone appear normal for age. No joint deformities or effusions. Extremities: No clubbing or cyanosis. No edema.  Distal pedal pulses are 2+ bilaterally. Neuro: Alert and oriented X 3. Moves all extremities spontaneously. No focal deficits noted. Psych:  Responds to questions appropriately with a normal affect. Skin: No rashes or lesions noted  Wt Readings from Last 3 Encounters:  06/28/17 197 lb 12 oz (89.7 kg)  05/23/17 202 lb (91.6 kg)  05/12/17 202 lb (91.6 kg)        Studies/Labs  Reviewed:   EKG:  EKG is*** ordered today.  The ekg ordered today demonstrates ***  Recent Labs: 01/27/2017: TSH 1.932 02/03/2017: Magnesium 2.0 05/23/2017: ALT 16 06/27/2017: B Natriuretic Peptide 1,284.0 06/28/2017: BUN 58; Creatinine, Ser 2.40; Hemoglobin 11.5; Platelets 180; Potassium 5.1; Sodium 140   Lipid Panel No results found for: CHOL, TRIG, HDL, CHOLHDL, VLDL, LDLCALC, LDLDIRECT  Additional studies/ records that were reviewed today include:  ***  Assessment:    No diagnosis found.   Plan:   In order of problems listed above:  1. ***    Medication Adjustments/Labs and Tests Ordered: Current medicines are reviewed at length with the patient today.  Concerns regarding medicines are outlined above.  Medication changes, Labs and Tests ordered today are listed in the Patient Instructions below. There are no Patient Instructions on file for this visit.   Signed, Erma Heritage, PA-C  07/05/2017 2:07 PM    [facility] [address] [place], [place] [address] Phone: [phone]

## 2017-07-06 ENCOUNTER — Ambulatory Visit: Payer: Medicare Other | Admitting: Student

## 2017-07-07 ENCOUNTER — Encounter: Payer: Self-pay | Admitting: Student

## 2017-07-12 ENCOUNTER — Other Ambulatory Visit: Payer: Self-pay

## 2017-07-12 DIAGNOSIS — I77819 Aortic ectasia, unspecified site: Secondary | ICD-10-CM

## 2017-07-14 ENCOUNTER — Ambulatory Visit (INDEPENDENT_AMBULATORY_CARE_PROVIDER_SITE_OTHER): Payer: Medicare Other | Admitting: *Deleted

## 2017-07-14 DIAGNOSIS — Z5181 Encounter for therapeutic drug level monitoring: Secondary | ICD-10-CM

## 2017-07-14 DIAGNOSIS — I4891 Unspecified atrial fibrillation: Secondary | ICD-10-CM | POA: Diagnosis not present

## 2017-07-14 LAB — POCT INR: INR: 4.8 — AB (ref 2.0–3.0)

## 2017-07-14 NOTE — Patient Instructions (Signed)
Hold coumadin today, take 1/2 tablet tomorrow then resume 1 tablet daily Continue greens/salads.  Recheck in 3 weeks

## 2017-07-20 ENCOUNTER — Encounter (INDEPENDENT_AMBULATORY_CARE_PROVIDER_SITE_OTHER): Payer: Medicare Other | Admitting: Ophthalmology

## 2017-08-04 ENCOUNTER — Ambulatory Visit (HOSPITAL_COMMUNITY)
Admission: RE | Admit: 2017-08-04 | Discharge: 2017-08-04 | Disposition: A | Discharge status: Home / Self Care | Payer: Medicare Other | Source: Ambulatory Visit | Attending: Cardiovascular Disease | Admitting: Cardiovascular Disease

## 2017-08-04 ENCOUNTER — Telehealth: Payer: Self-pay | Admitting: *Deleted

## 2017-08-04 ENCOUNTER — Encounter (HOSPITAL_COMMUNITY): Payer: Self-pay

## 2017-08-04 ENCOUNTER — Other Ambulatory Visit: Payer: Self-pay | Admitting: Cardiovascular Disease

## 2017-08-04 DIAGNOSIS — I712 Thoracic aortic aneurysm, without rupture, unspecified: Secondary | ICD-10-CM

## 2017-08-04 DIAGNOSIS — I77819 Aortic ectasia, unspecified site: Secondary | ICD-10-CM

## 2017-08-04 LAB — POCT I-STAT CREATININE: CREATININE: 2 mg/dL — AB (ref 0.44–1.00)

## 2017-08-04 NOTE — Telephone Encounter (Signed)
-----   Message from Herminio Commons, MD sent at 08/04/2017  9:23 AM EDT ----- Regarding: RE: CT Angio  Looks she was seen in the ED on 05/23/2017 and already had a chest CT at that time.  She does not require a CT at this time.  It looks like she was also recently hospitalized for CHF in early June.  ----- Message ----- From: Levonne Hubert, LPN Sent: 0/63/0160   9:06 AM To: Herminio Commons, MD Subject: CT Angio                                       Pt in radiology for CT angio. Received call stating that her creatinine is currently elevated. They would like to know if you would like to scan her with out contrast? The can be reached a [phone]

## 2017-08-13 ENCOUNTER — Telehealth: Payer: Self-pay | Admitting: *Deleted

## 2017-08-13 ENCOUNTER — Other Ambulatory Visit (HOSPITAL_COMMUNITY)
Admission: RE | Admit: 2017-08-13 | Discharge: 2017-08-13 | Disposition: A | Discharge status: Home / Self Care | Payer: Medicare Other | Source: Ambulatory Visit | Attending: Cardiovascular Disease | Admitting: Cardiovascular Disease

## 2017-08-13 ENCOUNTER — Ambulatory Visit (INDEPENDENT_AMBULATORY_CARE_PROVIDER_SITE_OTHER): Payer: Medicare Other | Admitting: *Deleted

## 2017-08-13 DIAGNOSIS — Z5181 Encounter for therapeutic drug level monitoring: Secondary | ICD-10-CM

## 2017-08-13 DIAGNOSIS — Z79899 Other long term (current) drug therapy: Secondary | ICD-10-CM | POA: Insufficient documentation

## 2017-08-13 DIAGNOSIS — I4891 Unspecified atrial fibrillation: Secondary | ICD-10-CM

## 2017-08-13 LAB — BASIC METABOLIC PANEL
ANION GAP: 7 (ref 5–15)
BUN: 51 mg/dL — ABNORMAL HIGH (ref 8–23)
CHLORIDE: 102 mmol/L (ref 98–111)
CO2: 31 mmol/L (ref 22–32)
Calcium: 9 mg/dL (ref 8.9–10.3)
Creatinine, Ser: 2.07 mg/dL — ABNORMAL HIGH (ref 0.44–1.00)
GFR calc non Af Amer: 21 mL/min — ABNORMAL LOW (ref >60)
GFR, EST AFRICAN AMERICAN: 25 mL/min — AB (ref >60)
Glucose, Bld: 219 mg/dL — ABNORMAL HIGH (ref 70–99)
POTASSIUM: 4.8 mmol/L (ref 3.5–5.1)
Sodium: 140 mmol/L (ref 135–145)

## 2017-08-13 LAB — POCT INR: INR: 3.3 — AB (ref 2.0–3.0)

## 2017-08-13 NOTE — Telephone Encounter (Signed)
Called pt. No answer. No voicemail.

## 2017-08-13 NOTE — Patient Instructions (Signed)
Decrease coumadin to 1 tablet daily except 1/2 tablet on Fridays Continue greens/salads.  Recheck in 3 weeks

## 2017-08-13 NOTE — Telephone Encounter (Signed)
-----   Message from Herminio Commons, MD sent at 08/13/2017 11:39 AM EDT ----- Stable CKD

## 2017-08-24 ENCOUNTER — Ambulatory Visit (INDEPENDENT_AMBULATORY_CARE_PROVIDER_SITE_OTHER): Payer: Medicare Other | Admitting: Cardiovascular Disease

## 2017-08-24 ENCOUNTER — Ambulatory Visit: Payer: Medicare Other | Admitting: Cardiovascular Disease

## 2017-08-24 ENCOUNTER — Encounter: Payer: Self-pay | Admitting: Cardiovascular Disease

## 2017-08-24 VITALS — BP 158/84 | HR 80 | Ht 66.0 in | Wt 195.2 lb

## 2017-08-24 DIAGNOSIS — I38 Endocarditis, valve unspecified: Secondary | ICD-10-CM

## 2017-08-24 DIAGNOSIS — I428 Other cardiomyopathies: Secondary | ICD-10-CM | POA: Diagnosis not present

## 2017-08-24 DIAGNOSIS — I77819 Aortic ectasia, unspecified site: Secondary | ICD-10-CM | POA: Diagnosis not present

## 2017-08-24 DIAGNOSIS — I4891 Unspecified atrial fibrillation: Secondary | ICD-10-CM | POA: Diagnosis not present

## 2017-08-24 DIAGNOSIS — I10 Essential (primary) hypertension: Secondary | ICD-10-CM

## 2017-08-24 DIAGNOSIS — I5022 Chronic systolic (congestive) heart failure: Secondary | ICD-10-CM

## 2017-08-24 NOTE — Progress Notes (Signed)
SUBJECTIVE: The patient presents for routine follow-up.  She was hospitalized in early June for acute on chronic hypoxemic respiratory failure.  It was felt that this was most likely due to acute bronchitis rather than CHF.  BNP was elevated to 1200.  She also had a fever.  Echocardiogram on 06/28/2017 showed severely reduced left ventricular systolic function, LVEF 31%.  There was mild aortic and moderate mitral and tricuspid regurgitation with severe pulmonary hypertension.  This demonstrated a significant decline in LVEF from echocardiogram on 06/25/2016 when LVEF was 40 to 45%.  She denies chest pain.  Chronic exertional dyspnea is stable.  She has not had much difficulty with leg swelling.  She follows up with her PCP every 3 months and also follows up with her nephrologist periodically.  She has an upcoming cataract surgery and is nervous about this.  She tries to avoid consuming salts.      Review of Systems: As per "subjective", otherwise negative.  No Known Allergies  Current Outpatient Medications  Medication Sig Dispense Refill  . aspirin EC 81 MG tablet Take 81 mg by mouth daily.    . carvedilol (COREG) 12.5 MG tablet Take 1 tablet (12.5 mg total) by mouth 2 (two) times daily. 60 tablet 2  . Cholecalciferol (VITAMIN D PO) Take 1 tablet by mouth daily.    . COMBIGAN 0.2-0.5 % ophthalmic solution Place 1 drop into both eyes 2 (two) times daily.  6  . FEROSUL 325 (65 Fe) MG tablet Take 1 tablet by mouth 2 (two) times daily.  11  . furosemide (LASIX) 40 MG tablet Take 40 mg daily EXCEPT on Saturday and Sunday, take 80 MG ( 2 pills)  May take an extra 40 mg if you gain more than 3 lbs overnight 100 tablet 3  . glimepiride (AMARYL) 1 MG tablet Take 1 mg by mouth daily with breakfast.    . guaiFENesin-codeine 100-10 MG/5ML syrup Take 1 mL by mouth 4 (four) times daily.  0  . LORazepam (ATIVAN) 0.5 MG tablet Take 1 tablet by mouth as needed.  5  . potassium chloride SA  (K-DUR,KLOR-CON) 20 MEQ tablet Take 20 meq daily EXCEPT on Saturday and Sunday, take 40 meq (2 pills) (Patient taking differently: Take 20 mEq by mouth daily. Take 20 meq daily EXCEPT on Saturday and Sunday, take 40 meq (2 pills)) 30 tablet 2  . TRAVATAN Z 0.004 % SOLN ophthalmic solution Place 1 drop into both eyes at bedtime.  6  . warfarin (COUMADIN) 5 MG tablet Take 1 tablet daily except 1 1/2 tablets on Mondays and Thursdays (Patient taking differently: Take 5 mg by mouth daily. Take 1 tablet daily except 1 1/2 tablets on Tuesdays and Fridays) 40 tablet 3   No current facility-administered medications for this visit.     Past Medical History:  Diagnosis Date  . Anxiety   . Atrial fibrillation ([place])   . CHF (congestive heart failure) ([place])   . Diabetes mellitus without complication ([place])   . Hypertension     Past Surgical History:  Procedure Laterality Date  . BACK SURGERY    . EYE SURGERY Left     Social History   Socioeconomic History  . Marital status: Widowed    Spouse name: Not on file  . Number of children: Not on file  . Years of education: Not on file  . Highest education level: Not on file  Occupational History  . Not on file  Social Needs  . Financial resource strain: Not on file  . Food insecurity:    Worry: Not on file    Inability: Not on file  . Transportation needs:    Medical: Not on file    Non-medical: Not on file  Tobacco Use  . Smoking status: Never Smoker  . Smokeless tobacco: Never Used  Substance and Sexual Activity  . Alcohol use: No  . Drug use: No  . Sexual activity: Not on file  Lifestyle  . Physical activity:    Days per week: Not on file    Minutes per session: Not on file  . Stress: Not on file  Relationships  . Social connections:    Talks on phone: Not on file    Gets together: Not on file    Attends religious service: Not on file    Active member of club or organization: Not on file    Attends meetings of clubs or  organizations: Not on file    Relationship status: Not on file  . Intimate partner violence:    Fear of current or ex partner: Not on file    Emotionally abused: Not on file    Physically abused: Not on file    Forced sexual activity: Not on file  Other Topics Concern  . Not on file  Social History Narrative   Brother-in-law lives with patient.      Vitals:   08/24/17 0948  BP: (!) 158/84  Pulse: 80  SpO2: 92%  Weight: 195 lb 3.2 oz (88.5 kg)  Height: 5\' 6"  (1.676 m)    Wt Readings from Last 3 Encounters:  08/24/17 195 lb 3.2 oz (88.5 kg)  06/28/17 197 lb 12 oz (89.7 kg)  05/23/17 202 lb (91.6 kg)     PHYSICAL EXAM General: NAD HEENT: Normal. Neck: No JVD, no thyromegaly. Lungs: Clear to auscultation bilaterally with normal respiratory effort. CV: Regular rate and rhythm, normal S1/S2, no O9/G2, 2/6 systolicmurmurover right upper sternal border, left upper and lower sternal borders.  Trace bilateral lower extremity edema.     Abdomen: Soft, nontender, no distention.  Neurologic: Alert and oriented.  Psych: Normal affect. Skin: Normal. Musculoskeletal: No gross deformities.    ECG: Reviewed above under Subjective   Labs: Lab Results  Component Value Date/Time   K 4.8 08/13/2017 10:17 AM   BUN 51 (H) 08/13/2017 10:17 AM   CREATININE 2.07 (H) 08/13/2017 10:17 AM   CREATININE 1.76 (H) 02/03/2017 11:42 AM   ALT 16 05/23/2017 11:19 AM   TSH 1.932 01/27/2017 12:15 PM   HGB 11.5 (L) 06/28/2017 05:31 AM     Lipids: No results found for: LDLCALC, LDLDIRECT, CHOL, TRIG, HDL     ASSESSMENT AND PLAN: 1.  Chronic systolic heart failure, LVEF 25%: She appears euvolemic and stable.  She has had a significant decline in LVEF.  I will not change diuretic regimen at this time. I will continue to have her take 80 mg of Lasix and have her double up on her potassium on Saturdays and Sundays and continue Lasix 40 mg on weekdays.  I told her to weigh herself daily and to  take an extra 40 mg of Lasix should weight increased by 3 pounds within 24 hours or 5 pounds in a week. I am unable to add ACE inhibitors, angiotensin receptor blockers, or angiotensin receptor-neprilysin inhibitors due to advanced chronic kidney disease. I will obtain a Lexiscan Myoview stress test to evaluate for an ischemic etiology for  significant decline in left ventricular systolic function.  2.  Valvular heart disease: Symptomatically stable.  Echocardiogram reviewed above.  3. Persistent atrial fibrillation:Heart rate is controlled on carvedilol 12.5 mg twice daily. Anticoagulatedwith warfarin. No changes.  4. Hypertension: Blood pressure is elevated.  I will monitor.  Continue carvedilol 12.5 mg twice daily.  5. Aortic root dilatation: Aortic root dilatation is mild, 4 cm by chest CT on 05/23/2017.I will monitor.     Disposition: Follow up November 2019   Kate Sable, M.D., F.A.C.C.

## 2017-08-24 NOTE — Patient Instructions (Addendum)
Your physician wants you to follow-up in: November with [facility] has requested that you have a lexiscan myoview. For further information please visit [email or web address]. Please follow instruction sheet, as given.     Your physician recommends that you continue on your current medications as directed. Please refer to the Current Medication list given to you today.    If you need a refill on your cardiac medications before your next appointment, please call your pharmacy.     No labs today      Thank you for choosing [facility] !

## 2017-09-17 ENCOUNTER — Encounter (HOSPITAL_COMMUNITY): Payer: Medicare Other

## 2017-09-17 ENCOUNTER — Other Ambulatory Visit (HOSPITAL_COMMUNITY): Payer: Medicare Other

## 2017-09-20 ENCOUNTER — Ambulatory Visit (INDEPENDENT_AMBULATORY_CARE_PROVIDER_SITE_OTHER): Payer: Medicare Other | Admitting: *Deleted

## 2017-09-20 DIAGNOSIS — Z5181 Encounter for therapeutic drug level monitoring: Secondary | ICD-10-CM | POA: Diagnosis not present

## 2017-09-20 DIAGNOSIS — I4891 Unspecified atrial fibrillation: Secondary | ICD-10-CM | POA: Diagnosis not present

## 2017-09-20 LAB — POCT INR: INR: 2 (ref 2.0–3.0)

## 2017-09-20 NOTE — Patient Instructions (Signed)
Take coumadin 1 1/2 tablets tonight then resume 1 tablet daily except 1/2 tablet on Fridays Continue greens/salads.  Recheck in 4 weeks

## 2017-10-07 ENCOUNTER — Encounter (INDEPENDENT_AMBULATORY_CARE_PROVIDER_SITE_OTHER): Payer: Medicare Other | Admitting: Ophthalmology

## 2017-10-07 DIAGNOSIS — E113513 Type 2 diabetes mellitus with proliferative diabetic retinopathy with macular edema, bilateral: Secondary | ICD-10-CM

## 2017-10-07 DIAGNOSIS — I10 Essential (primary) hypertension: Secondary | ICD-10-CM | POA: Diagnosis not present

## 2017-10-07 DIAGNOSIS — E11311 Type 2 diabetes mellitus with unspecified diabetic retinopathy with macular edema: Secondary | ICD-10-CM

## 2017-10-07 DIAGNOSIS — H43813 Vitreous degeneration, bilateral: Secondary | ICD-10-CM

## 2017-10-07 DIAGNOSIS — H35033 Hypertensive retinopathy, bilateral: Secondary | ICD-10-CM | POA: Diagnosis not present

## 2017-10-22 ENCOUNTER — Ambulatory Visit (INDEPENDENT_AMBULATORY_CARE_PROVIDER_SITE_OTHER): Payer: Medicare Other | Admitting: *Deleted

## 2017-10-22 DIAGNOSIS — Z5181 Encounter for therapeutic drug level monitoring: Secondary | ICD-10-CM | POA: Diagnosis not present

## 2017-10-22 DIAGNOSIS — I4891 Unspecified atrial fibrillation: Secondary | ICD-10-CM

## 2017-10-22 LAB — POCT INR: INR: 2.7 (ref 2.0–3.0)

## 2017-10-22 NOTE — Patient Instructions (Signed)
Continue coumadin 1 tablet daily except 1/2 tablet on Fridays Continue greens/salads.  Recheck in 5 weeks

## 2017-11-02 ENCOUNTER — Telehealth: Payer: Self-pay | Admitting: *Deleted

## 2017-11-02 MED ORDER — WARFARIN SODIUM 5 MG PO TABS: ORAL_TABLET | ORAL | 3 refills | Status: DC

## 2017-11-02 NOTE — Telephone Encounter (Signed)
PT IS OUT OF HER warfarin (COUMADIN) 5 MG tablet [[id number]]  SHE CAN'T GET HER PHARMACY TO FILL IT  PLEASE CALL HER, SHE'S VERY CONFUSED

## 2017-11-18 ENCOUNTER — Encounter (INDEPENDENT_AMBULATORY_CARE_PROVIDER_SITE_OTHER): Payer: Medicare Other | Admitting: Ophthalmology

## 2017-11-18 DIAGNOSIS — E11311 Type 2 diabetes mellitus with unspecified diabetic retinopathy with macular edema: Secondary | ICD-10-CM

## 2017-11-18 DIAGNOSIS — I10 Essential (primary) hypertension: Secondary | ICD-10-CM | POA: Diagnosis not present

## 2017-11-18 DIAGNOSIS — E113591 Type 2 diabetes mellitus with proliferative diabetic retinopathy without macular edema, right eye: Secondary | ICD-10-CM | POA: Diagnosis not present

## 2017-11-18 DIAGNOSIS — H43811 Vitreous degeneration, right eye: Secondary | ICD-10-CM

## 2017-11-18 DIAGNOSIS — E113512 Type 2 diabetes mellitus with proliferative diabetic retinopathy with macular edema, left eye: Secondary | ICD-10-CM | POA: Diagnosis not present

## 2017-11-18 DIAGNOSIS — H35033 Hypertensive retinopathy, bilateral: Secondary | ICD-10-CM

## 2017-11-22 ENCOUNTER — Ambulatory Visit (INDEPENDENT_AMBULATORY_CARE_PROVIDER_SITE_OTHER): Payer: Medicare Other | Admitting: *Deleted

## 2017-11-22 DIAGNOSIS — I4891 Unspecified atrial fibrillation: Secondary | ICD-10-CM

## 2017-11-22 DIAGNOSIS — Z5181 Encounter for therapeutic drug level monitoring: Secondary | ICD-10-CM | POA: Diagnosis not present

## 2017-11-22 LAB — POCT INR: INR: 3.4 — AB (ref 2.0–3.0)

## 2017-11-22 NOTE — Patient Instructions (Signed)
Hold coumadin tonight then resume 1 tablet daily except 1/2 tablet on Fridays Continue greens/salads.  Recheck in 4 weeks

## 2017-12-01 ENCOUNTER — Ambulatory Visit: Payer: Medicare Other | Admitting: Cardiovascular Disease

## 2017-12-20 ENCOUNTER — Ambulatory Visit (INDEPENDENT_AMBULATORY_CARE_PROVIDER_SITE_OTHER): Payer: Medicare Other | Admitting: *Deleted

## 2017-12-20 DIAGNOSIS — I4891 Unspecified atrial fibrillation: Secondary | ICD-10-CM

## 2017-12-20 DIAGNOSIS — Z5181 Encounter for therapeutic drug level monitoring: Secondary | ICD-10-CM

## 2017-12-20 LAB — POCT INR: INR: 2.6 (ref 2.0–3.0)

## 2017-12-20 NOTE — Patient Instructions (Signed)
Continue coumadin 1 tablet daily except 1/2 tablet on Fridays Continue greens/salads.  Recheck in 4 weeks  Change to Wednesday appt after Dec. visit

## 2017-12-30 ENCOUNTER — Encounter (INDEPENDENT_AMBULATORY_CARE_PROVIDER_SITE_OTHER): Payer: Medicare Other | Admitting: Ophthalmology

## 2017-12-30 DIAGNOSIS — E113591 Type 2 diabetes mellitus with proliferative diabetic retinopathy without macular edema, right eye: Secondary | ICD-10-CM

## 2017-12-30 DIAGNOSIS — E113512 Type 2 diabetes mellitus with proliferative diabetic retinopathy with macular edema, left eye: Secondary | ICD-10-CM | POA: Diagnosis not present

## 2017-12-30 DIAGNOSIS — H35033 Hypertensive retinopathy, bilateral: Secondary | ICD-10-CM

## 2017-12-30 DIAGNOSIS — H26491 Other secondary cataract, right eye: Secondary | ICD-10-CM

## 2017-12-30 DIAGNOSIS — H43811 Vitreous degeneration, right eye: Secondary | ICD-10-CM

## 2017-12-30 DIAGNOSIS — E11311 Type 2 diabetes mellitus with unspecified diabetic retinopathy with macular edema: Secondary | ICD-10-CM

## 2017-12-30 DIAGNOSIS — I10 Essential (primary) hypertension: Secondary | ICD-10-CM | POA: Diagnosis not present

## 2018-01-17 ENCOUNTER — Ambulatory Visit (INDEPENDENT_AMBULATORY_CARE_PROVIDER_SITE_OTHER): Payer: Medicare Other | Admitting: *Deleted

## 2018-01-17 DIAGNOSIS — Z5181 Encounter for therapeutic drug level monitoring: Secondary | ICD-10-CM

## 2018-01-17 DIAGNOSIS — I4891 Unspecified atrial fibrillation: Secondary | ICD-10-CM

## 2018-01-17 LAB — POCT INR: INR: 4.6 — AB (ref 2.0–3.0)

## 2018-01-17 NOTE — Patient Instructions (Signed)
Continue coumadin 1 tablet daily except 1/2 tablet on Fridays Continue greens/salads.  Recheck in 3 weeks

## 2018-01-27 ENCOUNTER — Encounter (INDEPENDENT_AMBULATORY_CARE_PROVIDER_SITE_OTHER): Payer: Medicare Other | Admitting: Ophthalmology

## 2018-01-28 ENCOUNTER — Other Ambulatory Visit (HOSPITAL_COMMUNITY): Payer: Self-pay | Admitting: Nurse Practitioner

## 2018-01-28 ENCOUNTER — Ambulatory Visit (HOSPITAL_COMMUNITY)
Admission: RE | Admit: 2018-01-28 | Discharge: 2018-01-28 | Disposition: A | Discharge status: Home / Self Care | Payer: Medicare Other | Source: Ambulatory Visit | Attending: Nurse Practitioner | Admitting: Nurse Practitioner

## 2018-01-28 DIAGNOSIS — R609 Edema, unspecified: Secondary | ICD-10-CM

## 2018-02-01 ENCOUNTER — Encounter: Payer: Self-pay | Admitting: Cardiovascular Disease

## 2018-02-01 ENCOUNTER — Ambulatory Visit (INDEPENDENT_AMBULATORY_CARE_PROVIDER_SITE_OTHER): Payer: Medicare Other | Admitting: Cardiovascular Disease

## 2018-02-01 VITALS — BP 130/82 | HR 80 | Ht 65.0 in | Wt 205.0 lb

## 2018-02-01 DIAGNOSIS — I5022 Chronic systolic (congestive) heart failure: Secondary | ICD-10-CM | POA: Diagnosis not present

## 2018-02-01 DIAGNOSIS — I38 Endocarditis, valve unspecified: Secondary | ICD-10-CM | POA: Diagnosis not present

## 2018-02-01 DIAGNOSIS — I77819 Aortic ectasia, unspecified site: Secondary | ICD-10-CM | POA: Diagnosis not present

## 2018-02-01 DIAGNOSIS — I10 Essential (primary) hypertension: Secondary | ICD-10-CM

## 2018-02-01 DIAGNOSIS — I4819 Other persistent atrial fibrillation: Secondary | ICD-10-CM | POA: Diagnosis not present

## 2018-02-01 DIAGNOSIS — I428 Other cardiomyopathies: Secondary | ICD-10-CM

## 2018-02-01 NOTE — Patient Instructions (Signed)
Medication Instructions:   Your physician recommends that you continue on your current medications as directed. Please refer to the Current Medication list given to you today.   If you need a refill on your cardiac medications before your next appointment, please call your pharmacy.   Lab work: None today If you have labs (blood work) drawn today and your tests are completely normal, you will receive your results only by: Marland Kitchen MyChart Message (if you have MyChart) OR . A paper copy in the mail If you have any lab test that is abnormal or we need to change your treatment, we will call you to review the results.  Testing/Procedures: Your physician has requested that you have a lexiscan myoview. For further information please visit [email or web address]. Please follow instruction sheet, as given. .  Follow-Up: At [facility], you and your health needs are our priority.  As part of our continuing mission to provide you with exceptional heart care, we have created designated Provider Care Teams.  These Care Teams include your primary Cardiologist (physician) and Advanced Practice Providers (APPs -  Physician Assistants and Nurse Practitioners) who all work together to provide you with the care you need, when you need it. You will need a follow up appointment in 6 months.  Please call our office 2 months in advance to schedule this appointment.  You may see Kate Sable, MD or one of the following Advanced Practice Providers on your designated Care Team:   Bernerd Pho, PA-C [facility]) . Ermalinda Barrios, PA-C ([place])  Any Other Special Instructions Will Be Listed Below (If Applicable). None

## 2018-02-01 NOTE — Progress Notes (Signed)
SUBJECTIVE: The patient presents for follow-up of chronic systolic heart failure, valvular heart disease, and persistent atrial fibrillation.  Echocardiogram on 06/28/2017 showed severely reduced left ventricular systolic function, LVEF 17%.  There was mild aortic and moderate mitral and tricuspid regurgitation with severe pulmonary hypertension.  This demonstrated a significant decline in LVEF from echocardiogram on 06/25/2016 when LVEF was 40 to 45%.  She has chronic exertional dyspnea which appears to be stable.  She follows up with her PCP every 3 months and with her nephrologist periodically.  She denies chest pain and palpitations.  She tries to limit her sodium consumption.  I ordered a stress test at her last visit but it was not pursued as she had cataract surgery.  She is here with her daughter.   Review of Systems: As per "subjective", otherwise negative.  No Known Allergies  Current Outpatient Medications  Medication Sig Dispense Refill  . aspirin EC 81 MG tablet Take 81 mg by mouth daily.    . carvedilol (COREG) 12.5 MG tablet Take 1 tablet (12.5 mg total) by mouth 2 (two) times daily. 60 tablet 2  . Cholecalciferol (VITAMIN D PO) Take 1 tablet by mouth daily.    . COMBIGAN 0.2-0.5 % ophthalmic solution Place 1 drop into both eyes 2 (two) times daily.  6  . FEROSUL 325 (65 Fe) MG tablet Take 1 tablet by mouth 2 (two) times daily.  11  . furosemide (LASIX) 40 MG tablet Take 40 mg daily EXCEPT on Saturday and Sunday, take 80 MG ( 2 pills)  May take an extra 40 mg if you gain more than 3 lbs overnight 100 tablet 3  . glimepiride (AMARYL) 1 MG tablet Take 1 mg by mouth daily with breakfast.    . guaiFENesin-codeine 100-10 MG/5ML syrup Take 1 mL by mouth 4 (four) times daily.  0  . LORazepam (ATIVAN) 0.5 MG tablet Take 1 tablet by mouth as needed.  5  . potassium chloride SA (K-DUR,KLOR-CON) 20 MEQ tablet Take 20 meq daily EXCEPT on Saturday and Sunday, take 40 meq (2  pills) (Patient taking differently: Take 20 mEq by mouth daily. Take 20 meq daily EXCEPT on Saturday and Sunday, take 40 meq (2 pills)) 30 tablet 2  . TRAVATAN Z 0.004 % SOLN ophthalmic solution Place 1 drop into both eyes at bedtime.  6  . warfarin (COUMADIN) 5 MG tablet Take 1 tablet daily except 1/2 tablet on Fridays 90 tablet 3   No current facility-administered medications for this visit.     Past Medical History:  Diagnosis Date  . Anxiety   . Atrial fibrillation ([place])   . CHF (congestive heart failure) ([place])   . Diabetes mellitus without complication ([place])   . Hypertension     Past Surgical History:  Procedure Laterality Date  . BACK SURGERY    . EYE SURGERY Left     Social History   Socioeconomic History  . Marital status: Widowed    Spouse name: Not on file  . Number of children: Not on file  . Years of education: Not on file  . Highest education level: Not on file  Occupational History  . Not on file  Social Needs  . Financial resource strain: Not on file  . Food insecurity:    Worry: Not on file    Inability: Not on file  . Transportation needs:    Medical: Not on file    Non-medical: Not on file  Tobacco  Use  . Smoking status: Never Smoker  . Smokeless tobacco: Never Used  Substance and Sexual Activity  . Alcohol use: No  . Drug use: No  . Sexual activity: Not on file  Lifestyle  . Physical activity:    Days per week: Not on file    Minutes per session: Not on file  . Stress: Not on file  Relationships  . Social connections:    Talks on phone: Not on file    Gets together: Not on file    Attends religious service: Not on file    Active member of club or organization: Not on file    Attends meetings of clubs or organizations: Not on file    Relationship status: Not on file  . Intimate partner violence:    Fear of current or ex partner: Not on file    Emotionally abused: Not on file    Physically abused: Not on file    Forced sexual activity:  Not on file  Other Topics Concern  . Not on file  Social History Narrative   Brother-in-law lives with patient.      Vitals:   02/01/18 1259  BP: 130/82  Pulse: 80  Weight: 205 lb (93 kg)  Height: 5\' 5"  (1.651 m)    Wt Readings from Last 3 Encounters:  02/01/18 205 lb (93 kg)  08/24/17 195 lb 3.2 oz (88.5 kg)  06/28/17 197 lb 12 oz (89.7 kg)     PHYSICAL EXAM General: NAD HEENT: Normal. Neck: No JVD, no thyromegaly. Lungs: Clear to auscultation bilaterally with normal respiratory effort. CV: Regular rate and irregular rhythm, normal S1/S2, no S3, 2/6 systolicmurmurover right upper sternal border, left upper and lower sternal borders.  Trace bilateral lower extremity edema.   Abdomen: Soft, nontender, no distention.  Neurologic: Alert and oriented.  Psych: Normal affect. Skin: Normal. Musculoskeletal: No gross deformities.    ECG: None today   Labs: Lab Results  Component Value Date/Time   K 4.8 08/13/2017 10:17 AM   BUN 51 (H) 08/13/2017 10:17 AM   CREATININE 2.07 (H) 08/13/2017 10:17 AM   CREATININE 1.76 (H) 02/03/2017 11:42 AM   ALT 16 05/23/2017 11:19 AM   TSH 1.932 01/27/2017 12:15 PM   HGB 11.5 (L) 06/28/2017 05:31 AM     Lipids: No results found for: LDLCALC, LDLDIRECT, CHOL, TRIG, HDL     ASSESSMENT AND PLAN: 1.  Chronic systolic heart failure, LVEF 25%: She appears euvolemic and stable.  She has had a significant decline in LVEF.  I will not change diuretic regimen at this time. I will continue to have her take 80 mg of Lasix and have her double up on her potassium on Saturdays and Sundays and continue Lasix 40 mg on weekdays. I told her to weigh herself daily and to take an extra 40 mg of Lasix should weight increased by 3 pounds within 24 hours or 5 pounds in a week. I am unable to add ACE inhibitors, angiotensin receptor blockers, or angiotensin receptor-neprilysin inhibitors due to advanced chronic kidney disease. I previously ordered a  Lexiscan Myoview to evaluate for an ischemic etiology of her decline in LV function, but this was not pursued. I will reorder.  2.  Valvular heart disease: Symptomatically stable.  Echocardiogram reviewed above.  3. Persistent atrial fibrillation:Symptomatically stable. Heart rate is controlled on carvedilol 12.5 mg twice daily. Anticoagulatedwith warfarin. No changes.  4. Hypertension: Blood pressure is controlled.  No changes to therapy.  5. Aortic  root dilatation: Aortic root dilatation is mild, 4 cm by chest CT on 05/23/2017.I will monitor.    Disposition: Follow up 6 months   Kate Sable, M.D., F.A.C.C.

## 2018-02-04 ENCOUNTER — Other Ambulatory Visit: Payer: Self-pay

## 2018-02-04 ENCOUNTER — Observation Stay (HOSPITAL_COMMUNITY)
Admission: EM | Admit: 2018-02-04 | Discharge: 2018-02-06 | Disposition: A | Discharge status: Home / Self Care | Payer: Medicare Other | Attending: Family Medicine | Admitting: Family Medicine

## 2018-02-04 ENCOUNTER — Encounter (HOSPITAL_COMMUNITY): Payer: Self-pay | Admitting: *Deleted

## 2018-02-04 DIAGNOSIS — Z7901 Long term (current) use of anticoagulants: Secondary | ICD-10-CM | POA: Diagnosis not present

## 2018-02-04 DIAGNOSIS — I130 Hypertensive heart and chronic kidney disease with heart failure and stage 1 through stage 4 chronic kidney disease, or unspecified chronic kidney disease: Secondary | ICD-10-CM | POA: Insufficient documentation

## 2018-02-04 DIAGNOSIS — I4891 Unspecified atrial fibrillation: Secondary | ICD-10-CM | POA: Diagnosis not present

## 2018-02-04 DIAGNOSIS — L02512 Cutaneous abscess of left hand: Principal | ICD-10-CM | POA: Insufficient documentation

## 2018-02-04 DIAGNOSIS — E1122 Type 2 diabetes mellitus with diabetic chronic kidney disease: Secondary | ICD-10-CM | POA: Insufficient documentation

## 2018-02-04 DIAGNOSIS — L0291 Cutaneous abscess, unspecified: Secondary | ICD-10-CM

## 2018-02-04 DIAGNOSIS — R600 Localized edema: Secondary | ICD-10-CM | POA: Diagnosis present

## 2018-02-04 DIAGNOSIS — L03114 Cellulitis of left upper limb: Secondary | ICD-10-CM

## 2018-02-04 DIAGNOSIS — I5022 Chronic systolic (congestive) heart failure: Secondary | ICD-10-CM | POA: Diagnosis not present

## 2018-02-04 DIAGNOSIS — L03012 Cellulitis of left finger: Secondary | ICD-10-CM | POA: Diagnosis not present

## 2018-02-04 DIAGNOSIS — Z7982 Long term (current) use of aspirin: Secondary | ICD-10-CM | POA: Diagnosis not present

## 2018-02-04 DIAGNOSIS — N184 Chronic kidney disease, stage 4 (severe): Secondary | ICD-10-CM | POA: Diagnosis not present

## 2018-02-04 DIAGNOSIS — Z79899 Other long term (current) drug therapy: Secondary | ICD-10-CM | POA: Diagnosis not present

## 2018-02-04 DIAGNOSIS — I10 Essential (primary) hypertension: Secondary | ICD-10-CM | POA: Diagnosis present

## 2018-02-04 DIAGNOSIS — E119 Type 2 diabetes mellitus without complications: Secondary | ICD-10-CM

## 2018-02-04 LAB — CBC WITH DIFFERENTIAL/PLATELET
Abs Immature Granulocytes: 0.01 10*3/uL (ref 0.00–0.07)
BASOS ABS: 0 10*3/uL (ref 0.0–0.1)
Basophils Relative: 0 %
Eosinophils Absolute: 0.2 10*3/uL (ref 0.0–0.5)
Eosinophils Relative: 4 %
HCT: 35.7 % — ABNORMAL LOW (ref 36.0–46.0)
Hemoglobin: 10.6 g/dL — ABNORMAL LOW (ref 12.0–15.0)
Immature Granulocytes: 0 %
Lymphocytes Relative: 13 %
Lymphs Abs: 0.6 10*3/uL — ABNORMAL LOW (ref 0.7–4.0)
MCH: 30 pg (ref 26.0–34.0)
MCHC: 29.7 g/dL — AB (ref 30.0–36.0)
MCV: 101.1 fL — ABNORMAL HIGH (ref 80.0–100.0)
MONO ABS: 0.5 10*3/uL (ref 0.1–1.0)
Monocytes Relative: 10 %
Neutro Abs: 3.4 10*3/uL (ref 1.7–7.7)
Neutrophils Relative %: 73 %
Platelets: 174 10*3/uL (ref 150–400)
RBC: 3.53 MIL/uL — AB (ref 3.87–5.11)
RDW: 15.9 % — ABNORMAL HIGH (ref 11.5–15.5)
WBC: 4.7 10*3/uL (ref 4.0–10.5)
nRBC: 0 % (ref 0.0–0.2)

## 2018-02-04 LAB — BASIC METABOLIC PANEL
Anion gap: 12 (ref 5–15)
BUN: 71 mg/dL — ABNORMAL HIGH (ref 8–23)
CO2: 19 mmol/L — ABNORMAL LOW (ref 22–32)
Calcium: 8.8 mg/dL — ABNORMAL LOW (ref 8.9–10.3)
Chloride: 109 mmol/L (ref 98–111)
Creatinine, Ser: 1.98 mg/dL — ABNORMAL HIGH (ref 0.44–1.00)
GFR calc Af Amer: 27 mL/min — ABNORMAL LOW (ref >60)
GFR calc non Af Amer: 23 mL/min — ABNORMAL LOW (ref >60)
Glucose, Bld: 95 mg/dL (ref 70–99)
Potassium: 4.3 mmol/L (ref 3.5–5.1)
Sodium: 140 mmol/L (ref 135–145)

## 2018-02-04 LAB — PROTIME-INR
INR: 3.21
Prothrombin Time: 32.3 seconds — ABNORMAL HIGH (ref 11.4–15.2)

## 2018-02-04 LAB — HEMOGLOBIN A1C
Hgb A1c MFr Bld: 6.8 % — ABNORMAL HIGH (ref 4.8–5.6)
Mean Plasma Glucose: 148.46 mg/dL

## 2018-02-04 LAB — GLUCOSE, CAPILLARY: Glucose-Capillary: 111 mg/dL — ABNORMAL HIGH (ref 70–99)

## 2018-02-04 MED ORDER — LIDOCAINE HCL (PF) 1 % IJ SOLN
5.0000 mL | Freq: Once | INTRAMUSCULAR | Status: AC
  Administered 2018-02-04: 5 mL
  Filled 2018-02-04: qty 6

## 2018-02-04 MED ORDER — VANCOMYCIN HCL IN DEXTROSE 1-5 GM/200ML-% IV SOLN
1000.0000 mg | Freq: Once | INTRAVENOUS | Status: AC
  Administered 2018-02-04: 1000 mg via INTRAVENOUS
  Filled 2018-02-04: qty 200

## 2018-02-04 MED ORDER — CARVEDILOL 12.5 MG PO TABS
12.5000 mg | ORAL_TABLET | Freq: Two times a day (BID) | ORAL | Status: DC
  Administered 2018-02-04 – 2018-02-06 (×4): 12.5 mg via ORAL
  Filled 2018-02-04 (×4): qty 1

## 2018-02-04 MED ORDER — LIDOCAINE HCL (PF) 1 % IJ SOLN
INTRAMUSCULAR | Status: AC
  Filled 2018-02-04: qty 2

## 2018-02-04 MED ORDER — POTASSIUM CHLORIDE CRYS ER 20 MEQ PO TBCR
20.0000 meq | EXTENDED_RELEASE_TABLET | Freq: Every day | ORAL | Status: DC
  Administered 2018-02-05 – 2018-02-06 (×2): 20 meq via ORAL
  Filled 2018-02-04 (×2): qty 1

## 2018-02-04 MED ORDER — ACETAMINOPHEN 325 MG PO TABS: 650.0000 mg | ORAL_TABLET | Freq: Four times a day (QID) | ORAL | Status: DC | PRN

## 2018-02-04 MED ORDER — INSULIN ASPART 100 UNIT/ML ~~LOC~~ SOLN
0.0000 [IU] | Freq: Three times a day (TID) | SUBCUTANEOUS | Status: DC
  Administered 2018-02-05: 1 [IU] via SUBCUTANEOUS
  Administered 2018-02-05: 2 [IU] via SUBCUTANEOUS
  Administered 2018-02-06: 1 [IU] via SUBCUTANEOUS

## 2018-02-04 MED ORDER — FUROSEMIDE 40 MG PO TABS
40.0000 mg | ORAL_TABLET | Freq: Every day | ORAL | Status: DC
  Administered 2018-02-05 – 2018-02-06 (×2): 40 mg via ORAL
  Filled 2018-02-04 (×2): qty 1

## 2018-02-04 MED ORDER — ACETAMINOPHEN 650 MG RE SUPP: 650.0000 mg | Freq: Four times a day (QID) | RECTAL | Status: DC | PRN

## 2018-02-04 MED ORDER — POVIDONE-IODINE 10 % EX SOLN
CUTANEOUS | Status: AC
  Administered 2018-02-04: 14:00:00
  Filled 2018-02-04: qty 15

## 2018-02-04 MED ORDER — FERROUS SULFATE 325 (65 FE) MG PO TABS
325.0000 mg | ORAL_TABLET | Freq: Two times a day (BID) | ORAL | Status: DC
  Administered 2018-02-04 – 2018-02-06 (×4): 325 mg via ORAL
  Filled 2018-02-04 (×4): qty 1

## 2018-02-04 MED ORDER — ASPIRIN EC 81 MG PO TBEC
81.0000 mg | DELAYED_RELEASE_TABLET | Freq: Every day | ORAL | Status: DC
  Administered 2018-02-05 – 2018-02-06 (×2): 81 mg via ORAL
  Filled 2018-02-04 (×2): qty 1

## 2018-02-04 NOTE — ED Provider Notes (Signed)
[facility] EMERGENCY DEPARTMENT Provider Note   CSN: [id number] Arrival date & time: 02/04/18  1256     History   Chief Complaint Chief Complaint  Patient presents with  . Finger Swollen    HPI Barbara Stone is a 83 y.o. female.  HPI  Patient presents with her daughter who assists with the HPI. She presents due to concern of a left index finger wound which is gotten bigger rather than smaller in spite of using 2 antibiotics. Wound began about 1 week ago, and soon thereafter the patient received doxycycline and cephalosporin from her physician.  In spite of taking these for the past week she has had increasing pain, swelling, discoloration about the left index finger. No new fever, chest pain, dyspnea or other complaints.   Past Medical History:  Diagnosis Date  . Anxiety   . Atrial fibrillation ([place])   . CHF (congestive heart failure) ([place])   . Diabetes mellitus without complication ([place])   . Hypertension     Patient Active Problem List   Diagnosis Date Noted  . Acute on chronic systolic (congestive) heart failure ([place]) 06/27/2017  . Acute-on-chronic kidney injury ([place]) 06/27/2017  . Community acquired pneumonia 06/27/2017  . Diabetes mellitus without complication ([place])   . IDA (iron deficiency anemia) 12/14/2016  . Acute respiratory failure with hypoxemia ([place]) 03/19/2016  . Acute systolic CHF (congestive heart failure) ([place]) 03/19/2016  . HTN (hypertension) 03/19/2016  . CKD (chronic kidney disease) stage 3, GFR 30-59 ml/min (H[facility]) 03/19/2016  . Atrial fibrillation ([place]) [I48.91] 03/09/2016  . Encounter for therapeutic drug monitoring 03/09/2016    Past Surgical History:  Procedure Laterality Date  . BACK SURGERY    . EYE SURGERY Left      OB History   No obstetric history on file.      Home Medications    Prior to Admission medications   Medication Sig Start Date End Date Taking? Authorizing Provider  aspirin EC 81 MG tablet Take 81 mg by mouth  daily.   Yes [provider]  carvedilol (COREG) 12.5 MG tablet Take 1 tablet (12.5 mg total) by mouth 2 (two) times daily. 03/22/16  Yes Isaac Bliss, Rayford Halsted, MD  cefUROXime (CEFTIN) 500 MG tablet Take 500 mg by mouth 2 (two) times daily with a meal. Take 2 tables for 10 days   Yes [provider]  Cholecalciferol (VITAMIN D PO) Take 1 tablet by mouth daily.   Yes [provider]  COMBIGAN 0.2-0.5 % ophthalmic solution Place 1 drop into both eyes 2 (two) times daily. 06/22/17  Yes [provider]  doxycycline (DORYX) 100 MG EC tablet Take 100 mg by mouth 2 (two) times daily. Take 2 tables for 10 days   Yes [provider]  FEROSUL 325 (65 Fe) MG tablet Take 1 tablet by mouth 2 (two) times daily. 06/26/17  Yes [provider]  furosemide (LASIX) 40 MG tablet Take 40 mg daily EXCEPT on Saturday and Sunday, take 80 MG ( 2 pills)  May take an extra 40 mg if you gain more than 3 lbs overnight 05/12/17  Yes Herminio Commons, MD  glimepiride (AMARYL) 1 MG tablet Take 1 mg by mouth daily with breakfast.   Yes [provider]  guaiFENesin-codeine 100-10 MG/5ML syrup Take 1 mL by mouth 4 (four) times daily. 06/25/17  Yes [provider]  LORazepam (ATIVAN) 0.5 MG tablet Take 1 tablet by mouth as needed. 06/22/17  Yes [provider]  naproxen (NAPROSYN) 500 MG tablet Take 500 mg by mouth 2 (two) times daily with a meal. Take 1 tables 2 time a day for 10 days   Yes [provider]  potassium chloride SA (K-DUR,KLOR-CON) 20 MEQ tablet Take 20 meq daily EXCEPT on Saturday and Sunday, take 40 meq (2 pills) Patient taking differently: Take 20 mEq by mouth daily. Take 20 meq daily EXCEPT on Saturday and Sunday, take 40 meq (2 pills) 05/12/17  Yes Herminio Commons, MD  TRAVATAN Z 0.004 % SOLN ophthalmic solution Place 1 drop into both eyes at bedtime. 06/22/17  Yes [provider]  warfarin (COUMADIN) 5 MG tablet  Take 1 tablet daily except 1/2 tablet on Fridays 11/02/17  Yes Herminio Commons, MD    Family History Family History  Problem Relation Age of Onset  . Colon cancer Neg Hx     Social History Social History   Tobacco Use  . Smoking status: Never Smoker  . Smokeless tobacco: Never Used  Substance Use Topics  . Alcohol use: No  . Drug use: No     Allergies   Patient has no known allergies.   Review of Systems Review of Systems  Constitutional:       Per HPI, otherwise negative  HENT:       Per HPI, otherwise negative  Respiratory:       Per HPI, otherwise negative  Cardiovascular:       Per HPI, otherwise negative  Gastrointestinal: Negative for vomiting.  Endocrine:       Negative aside from HPI  Genitourinary:       Neg aside from HPI   Musculoskeletal:       Per HPI, otherwise negative  Skin: Positive for color change and wound.  Neurological: Negative for syncope.     Physical Exam Updated Vital Signs BP 133/78 (BP Location: Right Arm)   Pulse 69   Temp 98.4 F (36.9 C) (Oral)   Resp 20   Ht 5\' 5"  (1.651 m)   Wt 93 kg   SpO2 100%   BMI 34.11 kg/m   Physical Exam Vitals signs and nursing note reviewed.  Constitutional:      General: She is not in acute distress.    Appearance: She is well-developed.  HENT:     Head: Normocephalic and atraumatic.  Eyes:     Conjunctiva/sclera: Conjunctivae normal.  Cardiovascular:     Rate and Rhythm: Normal rate and regular rhythm.  Pulmonary:     Effort: Pulmonary effort is normal. No respiratory distress.     Breath sounds: Normal breath sounds. No stridor.  Abdominal:     General: There is no distension.  Skin:    General: Skin is warm and dry.       Neurological:     Mental Status: She is alert and oriented to person, place, and time.     Cranial Nerves: No cranial nerve deficit.      ED Treatments / Results  Labs (all labs ordered are listed, but only abnormal results are displayed) Labs  Reviewed  BASIC METABOLIC PANEL - Abnormal; Notable for the following components:      Result Value   CO2 19 (*)    BUN 71 (*)    Creatinine, Ser 1.98 (*)    Calcium 8.8 (*)    GFR calc non Af Amer 23 (*)    GFR calc Af Amer 27 (*)    All other components  within normal limits  CBC WITH DIFFERENTIAL/PLATELET - Abnormal; Notable for the following components:   RBC 3.53 (*)    Hemoglobin 10.6 (*)    HCT 35.7 (*)    MCV 101.1 (*)    MCHC 29.7 (*)    RDW 15.9 (*)    Lymphs Abs 0.6 (*)    All other components within normal limits    Procedures .Marland KitchenIncision and Drainage Date/Time: 02/04/2018 2:00 PM Performed by: Carmin Muskrat, MD Authorized by: Carmin Muskrat, MD   Consent:    Consent obtained:  Verbal   Consent given by:  Patient   Risks discussed:  Bleeding, infection, incomplete drainage and pain   Alternatives discussed:  No treatment Universal protocol:    Procedure explained and questions answered to patient or proxy's satisfaction: yes     Required blood products, implants, devices, and special equipment available: yes     Site/side marked: yes     Immediately prior to procedure a time out was called: yes     Patient identity confirmed:  Verbally with patient Location:    Type:  Abscess   Size:  4cm   Location:  Upper extremity   Upper extremity location:  Finger   Finger location:  L index finger Pre-procedure details:    Skin preparation:  Chloraprep Anesthesia (see MAR for exact dosages):    Anesthesia method:  Nerve block   Block needle gauge:  25 G   Block anesthetic:  Lidocaine 1% w/o epi   Block technique:  Digital block   Block injection procedure:  Anatomic landmarks identified, introduced needle, incremental injection, negative aspiration for blood and anatomic landmarks palpated   Block outcome:  Anesthesia achieved Procedure type:    Complexity:  Complex Procedure details:    Incision type: three straight ~1.5cm each, linear.   Incision depth:   Subcutaneous   Scalpel blade:  15   Wound management:  Probed and deloculated, irrigated with saline and extensive cleaning   Drainage:  Purulent   Drainage amount:  Copious   Wound treatment:  Wound left open   Packing materials:  None Post-procedure details:    Patient tolerance of procedure:  Tolerated well, no immediate complications   (including critical care time)  Medications Ordered in ED Medications  povidone-iodine (BETADINE) 10 % external solution (  Given by Other 02/04/18 1414)  lidocaine (PF) (XYLOCAINE) 1 % injection 5 mL (5 mLs Other Given by Other 02/04/18 1521)  vancomycin (VANCOCIN) IVPB 1000 mg/200 mL premix (1,000 mg Intravenous New Bag/Given 02/04/18 1538)     Initial Impression / Assessment and Plan / ED Course  I have reviewed the triage vital signs and the nursing notes.  Pertinent labs & imaging results that were available during my care of the patient were reviewed by me and considered in my medical decision making (see chart for details).  Elderly patient presents with a hand wound, concern for failed outpatient antibiotic therapy given the presence of cellulitis, multiple abscesses. Given her age, after incision and drainage was performed, vancomycin started, patient admitted for further evaluation and management. After the procedure patient was able to move her digit in all extremities, and there is low suspicion for septic joint, given this preserved capacity to move the joint both with and without resistance.  However, as above, with concern for cellulitis, abscesses, and lack of response to outpatient therapy, patient transition to IV antibiotics, was admitted for further evaluation and management.  Final Clinical Impressions(s) / ED Diagnoses  Final diagnoses:  Abscess of multiple sites     Carmin Muskrat, MD 02/04/18 1650

## 2018-02-04 NOTE — Progress Notes (Signed)
ANTICOAGULATION CONSULT NOTE - Initial Consult  Pharmacy Consult for Coumadin Indication: atrial fibrillation  No Known Allergies  Patient Measurements: Height: 5\' 5"  (165.1 cm) Weight: 203 lb 7.8 oz (92.3 kg) IBW/kg (Calculated) : 57  Vital Signs: Temp: 98.4 F (36.9 C) (01/10 1310) Temp Source: Oral (01/10 1310) BP: 150/71 (01/10 2027) Pulse Rate: 53 (01/10 2027)  Labs: Recent Labs    02/04/18 1535 02/04/18 1903  HGB 10.6*  --   HCT 35.7*  --   PLT 174  --   LABPROT  --  32.3*  INR  --  3.21  CREATININE 1.98*  --     Estimated Creatinine Clearance: 25.4 mL/min (A) (by C-G formula based on SCr of 1.98 mg/dL (H)).   Medical History: Past Medical History:  Diagnosis Date  . Anxiety   . Atrial fibrillation ([place])   . CHF (congestive heart failure) ([place])   . Diabetes mellitus without complication ([place])   . Hypertension     Medications:  Medications Prior to Admission  Medication Sig Dispense Refill Last Dose  . aspirin EC 81 MG tablet Take 81 mg by mouth daily.   02/04/2018 at Unknown time  . carvedilol (COREG) 12.5 MG tablet Take 1 tablet (12.5 mg total) by mouth 2 (two) times daily. 60 tablet 2 02/04/2018 at 0930  . cefUROXime (CEFTIN) 500 MG tablet Take 500 mg by mouth 2 (two) times daily with a meal. Take 2 tables for 10 days   02/04/2018 at Unknown time  . Cholecalciferol (VITAMIN D PO) Take 1 tablet by mouth daily.   02/04/2018 at Unknown time  . COMBIGAN 0.2-0.5 % ophthalmic solution Place 1 drop into both eyes 2 (two) times daily.  6 02/04/2018 at Unknown time  . doxycycline (DORYX) 100 MG EC tablet Take 100 mg by mouth 2 (two) times daily. Take 2 tables for 10 days   02/04/2018 at Unknown time  . FEROSUL 325 (65 Fe) MG tablet Take 1 tablet by mouth 2 (two) times daily.  11 02/04/2018 at Unknown time  . furosemide (LASIX) 40 MG tablet Take 40 mg daily EXCEPT on Saturday and Sunday, take 80 MG ( 2 pills)  May take an extra 40 mg if you gain more than 3 lbs  overnight 100 tablet 3 02/04/2018 at Unknown time  . glimepiride (AMARYL) 1 MG tablet Take 1 mg by mouth daily with breakfast.   02/04/2018 at Unknown time  . guaiFENesin-codeine 100-10 MG/5ML syrup Take 1 mL by mouth 4 (four) times daily.  0 02/04/2018 at Unknown time  . LORazepam (ATIVAN) 0.5 MG tablet Take 1 tablet by mouth as needed.  5 02/04/2018 at Unknown time  . naproxen (NAPROSYN) 500 MG tablet Take 500 mg by mouth 2 (two) times daily with a meal. Take 1 tables 2 time a day for 10 days   02/04/2018 at Unknown time  . potassium chloride SA (K-DUR,KLOR-CON) 20 MEQ tablet Take 20 meq daily EXCEPT on Saturday and Sunday, take 40 meq (2 pills) (Patient taking differently: Take 20 mEq by mouth daily. Take 20 meq daily EXCEPT on Saturday and Sunday, take 40 meq (2 pills)) 30 tablet 2 02/04/2018 at Unknown time  . TRAVATAN Z 0.004 % SOLN ophthalmic solution Place 1 drop into both eyes at bedtime.  6 02/04/2018 at Unknown time  . warfarin (COUMADIN) 5 MG tablet Take 1 tablet daily except 1/2 tablet on Fridays 90 tablet 3 02/03/2018 at 1730    Assessment: 83 yo female on chronic  coumadin for afib. INR on admission is elevated at 3.21. Home dose is usually 5mg  daily except 2.5mg  on Friday.  Goal of Therapy:  INR 2-3 Monitor platelets by anticoagulation protocol: Yes   Plan:  No coumadin today PT-INR daily Monitor for s/s of bleeding  Isac Sarna, BS Vena [place], BCPS Clinical Pharmacist Pager [phone] 02/04/2018,8:49 PM

## 2018-02-04 NOTE — ED Triage Notes (Signed)
Pt c/o swelling and redness to left index finger x 1 week. Pt was seen by PCP and placed on Cefuroxime, Doxycycline and Naproxen with no relief. Pt was seen in office today and the doctor reports the swelling has gotten worse and sent pt to ED. Pt reports only slight intermittent pain.

## 2018-02-04 NOTE — H&P (Signed)
History and Physical    Barbara Stone VQM:[id number] DOB: [birth year] DOA: 02/04/2018  PCP: Lemmie Evens, MD  Patient coming from: PCP office  I have personally briefly reviewed patient's old medical records in [place]  Chief Complaint: Left first finger infection  HPI: Barbara Stone is a 83 y.o. female with medical history significant for chronic systolic CHF (EF 93% by TTE 06/2017), atrial fibrillation on Coumadin, type 2 diabetes, hypertension, and CKD stage IV presents the ED with concern of worsening left index finger wound.  Patient first noticed wound about 1 week ago and was seen by her PCP.  She was started on doxycycline and Ceftin which she has been taking regularly.  She follow-up with her PCP day of admission 02/04/2018 told her that the swelling appeared worse and she was subsequently sent to the ED.  She says she has not really had any pain at the effected area.  She says she saw 2 or 3 small areas of swelling and erythema between the left MCP and left PIP of the left first finger.  She denies any associated fevers, chest pain, dyspnea, abdominal pain.  She report swelling in her legs which is unchanged.  ED Course:  Initial vitals showed BP 133/78, pulse 69, RR 20, temp 98.4 Fahrenheit, SPO2 100% on room air.  Labs notable for WBC 4.7, hemoglobin 10.6, platelets 174, creatinine 1.98.  Patient underwent I&D of left index finger wounds with noted purulent drainage.  IV vancomycin was started and the hospital service was consulted to admit for further management.  Review of Systems: As per HPI otherwise 10 point review of systems negative.    Past Medical History:  Diagnosis Date  . Anxiety   . Atrial fibrillation ([place])   . CHF (congestive heart failure) ([place])   . Diabetes mellitus without complication ([place])   . Hypertension     Past Surgical History:  Procedure Laterality Date  . BACK SURGERY    . EYE SURGERY Left      reports that she has never smoked.  She has never used smokeless tobacco. She reports that she does not drink alcohol or use drugs.  No Known Allergies  Family History  Problem Relation Age of Onset  . Colon cancer Neg Hx      Prior to Admission medications   Medication Sig Start Date End Date Taking? Authorizing Provider  aspirin EC 81 MG tablet Take 81 mg by mouth daily.   Yes [provider]  carvedilol (COREG) 12.5 MG tablet Take 1 tablet (12.5 mg total) by mouth 2 (two) times daily. 03/22/16  Yes Isaac Bliss, Rayford Halsted, MD  cefUROXime (CEFTIN) 500 MG tablet Take 500 mg by mouth 2 (two) times daily with a meal. Take 2 tables for 10 days   Yes [provider]  Cholecalciferol (VITAMIN D PO) Take 1 tablet by mouth daily.   Yes [provider]  COMBIGAN 0.2-0.5 % ophthalmic solution Place 1 drop into both eyes 2 (two) times daily. 06/22/17  Yes [provider]  doxycycline (DORYX) 100 MG EC tablet Take 100 mg by mouth 2 (two) times daily. Take 2 tables for 10 days   Yes [provider]  FEROSUL 325 (65 Fe) MG tablet Take 1 tablet by mouth 2 (two) times daily. 06/26/17  Yes [provider]  furosemide (LASIX) 40 MG tablet Take 40 mg daily EXCEPT on Saturday and Sunday, take 80 MG ( 2 pills)  May take an extra  40 mg if you gain more than 3 lbs overnight 05/12/17  Yes Herminio Commons, MD  glimepiride (AMARYL) 1 MG tablet Take 1 mg by mouth daily with breakfast.   Yes [provider]  guaiFENesin-codeine 100-10 MG/5ML syrup Take 1 mL by mouth 4 (four) times daily. 06/25/17  Yes [provider]  LORazepam (ATIVAN) 0.5 MG tablet Take 1 tablet by mouth as needed. 06/22/17  Yes [provider]  naproxen (NAPROSYN) 500 MG tablet Take 500 mg by mouth 2 (two) times daily with a meal. Take 1 tables 2 time a day for 10 days   Yes [provider]  potassium chloride SA (K-DUR,KLOR-CON) 20 MEQ tablet Take 20 meq daily EXCEPT on Saturday and Sunday,  take 40 meq (2 pills) Patient taking differently: Take 20 mEq by mouth daily. Take 20 meq daily EXCEPT on Saturday and Sunday, take 40 meq (2 pills) 05/12/17  Yes Herminio Commons, MD  TRAVATAN Z 0.004 % SOLN ophthalmic solution Place 1 drop into both eyes at bedtime. 06/22/17  Yes [provider]  warfarin (COUMADIN) 5 MG tablet Take 1 tablet daily except 1/2 tablet on Fridays 11/02/17  Yes Herminio Commons, MD    Physical Exam: Vitals:   02/04/18 1310 02/04/18 1900  BP: 133/78 115/89  Pulse: 69 69  Resp: 20 18  Temp: 98.4 F (36.9 C)   TempSrc: Oral   SpO2: 100% 100%  Weight: 93 kg   Height: 5\' 5"  (1.651 m)     Constitutional: NAD, calm, comfortable Eyes: PERRL, lids and conjunctivae normal ENMT: Mucous membranes are moist. Posterior pharynx clear of any exudate or lesions. Neck: normal, supple, no masses. Respiratory: clear to auscultation bilaterally, no wheezing, no crackles. Normal respiratory effort. No accessory muscle use.  Cardiovascular: Regular rate and rhythm, no murmurs / rubs / gallops.  +1 pitting edema bilateral lower extremities. Abdomen: no tenderness, no masses palpated. No hepatosplenomegaly. Bowel sounds positive.  Musculoskeletal: Left first finger status post I&D with wound wrapping in place.  Range of motion of left first finger slightly limited in flexion due to wound wrapping.  Range of motion of all of the fingers are intact. Skin: Wound wrapping in place over left first finger status post I&D. Neurologic: CN 2-12 grossly intact. Sensation intact, Strength 5/5 in all 4.  Psychiatric: Normal judgment and insight. Alert and oriented x 3. Normal mood.    Labs on Admission: I have personally reviewed following labs and imaging studies  CBC: Recent Labs  Lab 02/04/18 1535  WBC 4.7  NEUTROABS 3.4  HGB 10.6*  HCT 35.7*  MCV 101.1*  PLT 244   Basic Metabolic Panel: Recent Labs  Lab 02/04/18 1535  NA 140  K 4.3  CL 109  CO2 19*    GLUCOSE 95  BUN 71*  CREATININE 1.98*  CALCIUM 8.8*   GFR: Estimated Creatinine Clearance: 25.5 mL/min (A) (by C-G formula based on SCr of 1.98 mg/dL (H)). Liver Function Tests: No results for input(s): AST, ALT, ALKPHOS, BILITOT, PROT, ALBUMIN in the last 168 hours. No results for input(s): LIPASE, AMYLASE in the last 168 hours. No results for input(s): AMMONIA in the last 168 hours. Coagulation Profile: No results for input(s): INR, PROTIME in the last 168 hours. Cardiac Enzymes: No results for input(s): CKTOTAL, CKMB, CKMBINDEX, TROPONINI in the last 168 hours. BNP (last 3 results) No results for input(s): PROBNP in the last 8760 hours. HbA1C: No results for input(s): HGBA1C in the last 72  hours. CBG: Recent Labs  Lab 02/04/18 2023  GLUCAP 111*   Lipid Profile: No results for input(s): CHOL, HDL, LDLCALC, TRIG, CHOLHDL, LDLDIRECT in the last 72 hours. Thyroid Function Tests: No results for input(s): TSH, T4TOTAL, FREET4, T3FREE, THYROIDAB in the last 72 hours. Anemia Panel: No results for input(s): VITAMINB12, FOLATE, FERRITIN, TIBC, IRON, RETICCTPCT in the last 72 hours. Urine analysis:    Component Value Date/Time   COLORURINE YELLOW 06/27/2017 2300   APPEARANCEUR CLEAR 06/27/2017 2300   LABSPEC 1.010 06/27/2017 2300   PHURINE 5.0 06/27/2017 2300   GLUCOSEU NEGATIVE 06/27/2017 2300   HGBUR NEGATIVE 06/27/2017 2300   BILIRUBINUR NEGATIVE 06/27/2017 2300   KETONESUR NEGATIVE 06/27/2017 2300   PROTEINUR NEGATIVE 06/27/2017 2300   UROBILINOGEN 0.2 09/06/2006 1425   NITRITE NEGATIVE 06/27/2017 2300   LEUKOCYTESUR NEGATIVE 06/27/2017 2300    Radiological Exams on Admission: No results found.   Assessment/Plan Principal Problem:   Cellulitis of left hand Active Problems:   Atrial fibrillation (HCC) [F53.79]   Chronic systolic CHF (congestive heart failure) (HCC)   HTN (hypertension)   Chronic kidney disease (CKD), stage IV (severe) (HCC)   Diabetes mellitus  without complication ([place])   Barbara Stone is a 83 y.o. female with medical history significant for chronic systolic CHF (EF 43% by TTE 06/2017), atrial fibrillation on Coumadin, type 2 diabetes, hypertension, and CKD stage IV presents the ED with concern of worsening left index finger wounds.    Left first finger superficial abscesses and cellulitis: S/p I&D in the ED with reported purulent drainage after outpatient failure of doxycycline and Ceftin.  Range of motion of fingers remains intact on admission.  Patient is afebrile and hemodynamically stable. -Continue IV vancomycin overnight, can likely transition to oral antibiotics in a.m.  Atrial fibrillation: Regular rate and rhythm on exam. -Continue Coumadin per pharmacy -Continue Coreg  Chronic systolic CHF: Has edema both legs which is reportedly stable at her baseline, no symptoms of acute CHF exacerbation. -Continue home Lasix and Coreg -Monitor I/O's  Type 2 diabetes: On Amaryl as an outpatient. -SSI while inpatient  Hypertension: Stable, continue Coreg and Lasix.  CKD stage IV: Renal function appears stable at her recent baseline. -Continue to monitor   DVT prophylaxis: Coumadin Code Status: Full code Family Communication: None present at bedside admission Disposition Plan: Likely discharge to home in 1 day Consults called: None Admission status: Observation   Zada Finders MD [facility] Pager [phone]  If 7PM-7AM, please contact night-coverage [email or web address] Password [facility]  02/04/2018, 8:28 PM

## 2018-02-05 DIAGNOSIS — N184 Chronic kidney disease, stage 4 (severe): Secondary | ICD-10-CM | POA: Diagnosis not present

## 2018-02-05 DIAGNOSIS — I5022 Chronic systolic (congestive) heart failure: Secondary | ICD-10-CM

## 2018-02-05 DIAGNOSIS — E119 Type 2 diabetes mellitus without complications: Secondary | ICD-10-CM

## 2018-02-05 DIAGNOSIS — L02512 Cutaneous abscess of left hand: Secondary | ICD-10-CM | POA: Diagnosis not present

## 2018-02-05 DIAGNOSIS — I4891 Unspecified atrial fibrillation: Secondary | ICD-10-CM | POA: Diagnosis not present

## 2018-02-05 DIAGNOSIS — L03114 Cellulitis of left upper limb: Secondary | ICD-10-CM | POA: Diagnosis not present

## 2018-02-05 DIAGNOSIS — I10 Essential (primary) hypertension: Secondary | ICD-10-CM

## 2018-02-05 LAB — BASIC METABOLIC PANEL
Anion gap: 9 (ref 5–15)
BUN: 70 mg/dL — ABNORMAL HIGH (ref 8–23)
CO2: 23 mmol/L (ref 22–32)
Calcium: 8.9 mg/dL (ref 8.9–10.3)
Chloride: 110 mmol/L (ref 98–111)
Creatinine, Ser: 1.87 mg/dL — ABNORMAL HIGH (ref 0.44–1.00)
GFR calc Af Amer: 29 mL/min — ABNORMAL LOW (ref >60)
GFR calc non Af Amer: 25 mL/min — ABNORMAL LOW (ref >60)
Glucose, Bld: 82 mg/dL (ref 70–99)
Potassium: 4 mmol/L (ref 3.5–5.1)
Sodium: 142 mmol/L (ref 135–145)

## 2018-02-05 LAB — PROTIME-INR
INR: 3.36
Prothrombin Time: 33.5 seconds — ABNORMAL HIGH (ref 11.4–15.2)

## 2018-02-05 LAB — CBC
HCT: 32 % — ABNORMAL LOW (ref 36.0–46.0)
Hemoglobin: 9.5 g/dL — ABNORMAL LOW (ref 12.0–15.0)
MCH: 30.3 pg (ref 26.0–34.0)
MCHC: 29.7 g/dL — ABNORMAL LOW (ref 30.0–36.0)
MCV: 101.9 fL — ABNORMAL HIGH (ref 80.0–100.0)
Platelets: 176 10*3/uL (ref 150–400)
RBC: 3.14 MIL/uL — ABNORMAL LOW (ref 3.87–5.11)
RDW: 15.9 % — AB (ref 11.5–15.5)
WBC: 4.4 10*3/uL (ref 4.0–10.5)
nRBC: 0 % (ref 0.0–0.2)

## 2018-02-05 LAB — GLUCOSE, CAPILLARY
GLUCOSE-CAPILLARY: 104 mg/dL — AB (ref 70–99)
Glucose-Capillary: 73 mg/dL (ref 70–99)
Glucose-Capillary: 134 mg/dL — ABNORMAL HIGH (ref 70–99)
Glucose-Capillary: 178 mg/dL — ABNORMAL HIGH (ref 70–99)

## 2018-02-05 MED ORDER — VANCOMYCIN HCL IN DEXTROSE 750-5 MG/150ML-% IV SOLN
750.0000 mg | INTRAVENOUS | Status: DC
  Administered 2018-02-05: 750 mg via INTRAVENOUS
  Filled 2018-02-05 (×3): qty 150

## 2018-02-05 NOTE — Progress Notes (Signed)
Pharmacy Antibiotic Note  Barbara Stone is a 83 y.o. female admitted on 02/04/2018 with cellulitis.  Pharmacy has been consulted for Vancomycin dosing.  Plan: Vancomycin 1000mg  IV given in ED, then 750mg   IV every 24 hours.  Goal trough 10-15 mcg/mL.  F/u cxs and clinical progress Monitor V/S, labs and levels as indicated  Height: 5\' 5"  (165.1 cm) Weight: 205 lb 4 oz (93.1 kg) IBW/kg (Calculated) : 57  Temp (24hrs), Avg:98.5 F (36.9 C), Min:98.4 F (36.9 C), Max:98.6 F (37 C)  Recent Labs  Lab 02/04/18 1535 02/05/18 0645  WBC 4.7 4.4  CREATININE 1.98* 1.87*    Estimated Creatinine Clearance: 27 mL/min (A) (by C-G formula based on SCr of 1.87 mg/dL (H)).    No Known Allergies  Antimicrobials this admission: Vancomycin 1/10 >>   Dose adjustments this admission: N/A  Microbiology results: No cxs  Thank you for allowing pharmacy to be a part of this patient's care.  Isac Sarna, BS Pharm D, [place] Clinical Pharmacist Pager [phone] 02/05/2018 8:52 AM

## 2018-02-05 NOTE — Progress Notes (Signed)
PROGRESS NOTE  Barbara Stone  ONG:[id number]  DOB: 1935-09-15  DOA: 02/04/2018 PCP: Lemmie Evens, MD   Brief Admission Hx: 83 y.o. female with medical history significant for chronic systolic CHF (EF 44% by TTE 06/2017), atrial fibrillation on Coumadin, type 2 diabetes, hypertension, and CKD stage IV presents the ED with concern of worsening left index finger wound.   MDM/Assessment & Plan:   1. Left index finger abscess - Pt is s/p I&D in ED.  I was able to express more purulent fluid from wound this morning during rounds.  I spoke with Hand surgeon Dr. Fredna Dow.  I reviewed case with him.  He said that patient can remain at [facility] for now.  Pt could discharge home tomorrow on oral doxycycline and follow up with him in office on Monday.  He said that if we could get hydrotherapy from PT today that would be beneficial.   2. Atrial fibrillation - Pt is fully anticoagulated on warfarin.  Continue coreg for rate control.  3. Chronic systolic CHF - stable at baseline per patient and family.  Continue home meds.  4. Type 2 DM - SSI while inpatient.  5. Essential hypertension - stable, continue home meds.  6. Stage 4 CKD - follow renal function.    DVT prophylaxis: warfarin Code Status: Full  Family Communication: daughter at bedside  Disposition Plan: home tomorrow if stable    Consultants:  Phone (hand orthopedics - Dr. Fredna Dow)  Procedures:  I&D   Antimicrobials:  Vancomycin 1/10>   Subjective: Pt says that the movement is improving in her finger.  Pain is much improved.  Still draining.    Objective: Vitals:   02/04/18 2027 02/04/18 2124 02/05/18 0422 02/05/18 0525  BP: (!) 150/71   129/65  Pulse: (!) 53   76  Resp: 18   18  Temp:    98.6 F (37 C)  TempSrc:    Oral  SpO2: 100% 98%  98%  Weight: 92.3 kg  93.1 kg   Height: 5\' 5"  (1.651 m)       Intake/Output Summary (Last 24 hours) at 02/05/2018 1412 Last data filed at 02/05/2018 0102 Gross per 24 hour  Intake 440 ml    Output 300 ml  Net 140 ml   Filed Weights   02/04/18 1310 02/04/18 2027 02/05/18 0422  Weight: 93 kg 92.3 kg 93.1 kg   REVIEW OF SYSTEMS  As per history otherwise all reviewed and reported negative  Exam:  General exam: awake, alert, NAD, cooperative.  Respiratory system: Clear. No increased work of breathing. Cardiovascular system: S1 & S2 heard, RRR. No JVD, murmurs, gallops, clicks or pedal edema. Gastrointestinal system: Abdomen is nondistended, soft and nontender. Normal bowel sounds heard. Central nervous system: Alert and oriented. No focal neurological deficits. Extremities: left index finger wound: expressed purulent drainage, wound appears to be less inflamed, but remains edematous, sensation intact, good blood flow.   Data Reviewed: Basic Metabolic Panel: Recent Labs  Lab 02/04/18 1535 02/05/18 0645  NA 140 142  K 4.3 4.0  CL 109 110  CO2 19* 23  GLUCOSE 95 82  BUN 71* 70*  CREATININE 1.98* 1.87*  CALCIUM 8.8* 8.9   Liver Function Tests: No results for input(s): AST, ALT, ALKPHOS, BILITOT, PROT, ALBUMIN in the last 168 hours. No results for input(s): LIPASE, AMYLASE in the last 168 hours. No results for input(s): AMMONIA in the last 168 hours. CBC: Recent Labs  Lab 02/04/18 1535 02/05/18 0645  WBC  4.7 4.4  NEUTROABS 3.4  --   HGB 10.6* 9.5*  HCT 35.7* 32.0*  MCV 101.1* 101.9*  PLT 174 176   Cardiac Enzymes: No results for input(s): CKTOTAL, CKMB, CKMBINDEX, TROPONINI in the last 168 hours. CBG (last 3)  Recent Labs    02/04/18 2023 02/05/18 0745 02/05/18 1141  GLUCAP 111* 73 134*   No results found for this or any previous visit (from the past 240 hour(s)).   Studies: No results found.   Scheduled Meds: . aspirin EC  81 mg Oral Daily  . carvedilol  12.5 mg Oral BID WC  . ferrous sulfate  325 mg Oral BID  . furosemide  40 mg Oral Daily  . insulin aspart  0-9 Units Subcutaneous TID WC  . potassium chloride SA  20 mEq Oral Daily    Continuous Infusions: . vancomycin      Principal Problem:   Cellulitis of left hand Active Problems:   Atrial fibrillation (HCC) [O27.74]   Chronic systolic CHF (congestive heart failure) (HCC)   HTN (hypertension)   Chronic kidney disease (CKD), stage IV (severe) (HCC)   Diabetes mellitus without complication ([place])   Time spent:   Irwin Brakeman, MD, FAAFP [facility] If 7PM-7AM, please contact night-coverage 02/05/2018, 2:12 PM    LOS: 0 days

## 2018-02-05 NOTE — Progress Notes (Signed)
ANTICOAGULATION CONSULT NOTE - Initial Consult  Pharmacy Consult for Coumadin Indication: atrial fibrillation  No Known Allergies  Patient Measurements: Height: 5\' 5"  (165.1 cm) Weight: 205 lb 4 oz (93.1 kg) IBW/kg (Calculated) : 57  Vital Signs: Temp: 98.6 F (37 C) (01/11 0525) Temp Source: Oral (01/11 0525) BP: 129/65 (01/11 0525) Pulse Rate: 76 (01/11 0525)  Labs: Recent Labs    02/04/18 1535 02/04/18 1903 02/05/18 0645  HGB 10.6*  --  9.5*  HCT 35.7*  --  32.0*  PLT 174  --  176  LABPROT  --  32.3* 33.5*  INR  --  3.21 3.36  CREATININE 1.98*  --  1.87*    Estimated Creatinine Clearance: 27 mL/min (A) (by C-G formula based on SCr of 1.87 mg/dL (H)).   Medical History: Past Medical History:  Diagnosis Date  . Anxiety   . Atrial fibrillation ([place])   . CHF (congestive heart failure) ([place])   . Diabetes mellitus without complication ([place])   . Hypertension     Medications:  Medications Prior to Admission  Medication Sig Dispense Refill Last Dose  . aspirin EC 81 MG tablet Take 81 mg by mouth daily.   02/04/2018 at Unknown time  . carvedilol (COREG) 12.5 MG tablet Take 1 tablet (12.5 mg total) by mouth 2 (two) times daily. 60 tablet 2 02/04/2018 at 0930  . cefUROXime (CEFTIN) 500 MG tablet Take 500 mg by mouth 2 (two) times daily with a meal. Take 2 tables for 10 days   02/04/2018 at Unknown time  . Cholecalciferol (VITAMIN D PO) Take 1 tablet by mouth daily.   02/04/2018 at Unknown time  . COMBIGAN 0.2-0.5 % ophthalmic solution Place 1 drop into both eyes 2 (two) times daily.  6 02/04/2018 at Unknown time  . doxycycline (DORYX) 100 MG EC tablet Take 100 mg by mouth 2 (two) times daily. Take 2 tables for 10 days   02/04/2018 at Unknown time  . FEROSUL 325 (65 Fe) MG tablet Take 1 tablet by mouth 2 (two) times daily.  11 02/04/2018 at Unknown time  . furosemide (LASIX) 40 MG tablet Take 40 mg daily EXCEPT on Saturday and Sunday, take 80 MG ( 2 pills)  May take an extra  40 mg if you gain more than 3 lbs overnight 100 tablet 3 02/04/2018 at Unknown time  . glimepiride (AMARYL) 1 MG tablet Take 1 mg by mouth daily with breakfast.   02/04/2018 at Unknown time  . guaiFENesin-codeine 100-10 MG/5ML syrup Take 1 mL by mouth 4 (four) times daily.  0 02/04/2018 at Unknown time  . LORazepam (ATIVAN) 0.5 MG tablet Take 1 tablet by mouth as needed.  5 02/04/2018 at Unknown time  . naproxen (NAPROSYN) 500 MG tablet Take 500 mg by mouth 2 (two) times daily with a meal. Take 1 tables 2 time a day for 10 days   02/04/2018 at Unknown time  . potassium chloride SA (K-DUR,KLOR-CON) 20 MEQ tablet Take 20 meq daily EXCEPT on Saturday and Sunday, take 40 meq (2 pills) (Patient taking differently: Take 20 mEq by mouth daily. Take 20 meq daily EXCEPT on Saturday and Sunday, take 40 meq (2 pills)) 30 tablet 2 02/04/2018 at Unknown time  . TRAVATAN Z 0.004 % SOLN ophthalmic solution Place 1 drop into both eyes at bedtime.  6 02/04/2018 at Unknown time  . warfarin (COUMADIN) 5 MG tablet Take 1 tablet daily except 1/2 tablet on Fridays 90 tablet 3 02/03/2018 at 1730  Assessment: 83 yo female on chronic coumadin for afib. INR on admission is elevated and remains elevated this AM. Likely d/t antibiotics on at home.  Home dose is usually 5mg  daily except 2.5mg  on Friday.  Goal of Therapy:  INR 2-3 Monitor platelets by anticoagulation protocol: Yes   Plan:  No coumadin today PT-INR daily Monitor for s/s of bleeding  Isac Sarna, BS [place], BCPS Clinical Pharmacist Pager [phone] 02/05/2018,8:57 AM

## 2018-02-06 ENCOUNTER — Encounter (HOSPITAL_COMMUNITY): Payer: Self-pay | Admitting: Family Medicine

## 2018-02-06 DIAGNOSIS — L03114 Cellulitis of left upper limb: Secondary | ICD-10-CM | POA: Diagnosis not present

## 2018-02-06 DIAGNOSIS — I5022 Chronic systolic (congestive) heart failure: Secondary | ICD-10-CM | POA: Diagnosis not present

## 2018-02-06 DIAGNOSIS — N184 Chronic kidney disease, stage 4 (severe): Secondary | ICD-10-CM | POA: Diagnosis not present

## 2018-02-06 DIAGNOSIS — L02512 Cutaneous abscess of left hand: Secondary | ICD-10-CM | POA: Diagnosis not present

## 2018-02-06 DIAGNOSIS — I4891 Unspecified atrial fibrillation: Secondary | ICD-10-CM | POA: Diagnosis not present

## 2018-02-06 LAB — CBC
HCT: 33.1 % — ABNORMAL LOW (ref 36.0–46.0)
HEMOGLOBIN: 9.7 g/dL — AB (ref 12.0–15.0)
MCH: 29.7 pg (ref 26.0–34.0)
MCHC: 29.3 g/dL — ABNORMAL LOW (ref 30.0–36.0)
MCV: 101.2 fL — ABNORMAL HIGH (ref 80.0–100.0)
Platelets: 178 10*3/uL (ref 150–400)
RBC: 3.27 MIL/uL — AB (ref 3.87–5.11)
RDW: 16 % — ABNORMAL HIGH (ref 11.5–15.5)
WBC: 4.1 10*3/uL (ref 4.0–10.5)
nRBC: 0 % (ref 0.0–0.2)

## 2018-02-06 LAB — GLUCOSE, CAPILLARY
Glucose-Capillary: 100 mg/dL — ABNORMAL HIGH (ref 70–99)
Glucose-Capillary: 149 mg/dL — ABNORMAL HIGH (ref 70–99)

## 2018-02-06 LAB — BASIC METABOLIC PANEL
Anion gap: 11 (ref 5–15)
BUN: 61 mg/dL — ABNORMAL HIGH (ref 8–23)
CO2: 21 mmol/L — ABNORMAL LOW (ref 22–32)
Calcium: 8.7 mg/dL — ABNORMAL LOW (ref 8.9–10.3)
Chloride: 108 mmol/L (ref 98–111)
Creatinine, Ser: 1.88 mg/dL — ABNORMAL HIGH (ref 0.44–1.00)
GFR calc Af Amer: 29 mL/min — ABNORMAL LOW (ref >60)
GFR calc non Af Amer: 25 mL/min — ABNORMAL LOW (ref >60)
Glucose, Bld: 97 mg/dL (ref 70–99)
POTASSIUM: 3.7 mmol/L (ref 3.5–5.1)
Sodium: 140 mmol/L (ref 135–145)

## 2018-02-06 LAB — PROTIME-INR
INR: 3.18
Prothrombin Time: 32.1 seconds — ABNORMAL HIGH (ref 11.4–15.2)

## 2018-02-06 MED ORDER — TRAMADOL HCL 50 MG PO TABS: 50.0000 mg | ORAL_TABLET | Freq: Four times a day (QID) | ORAL | 0 refills | Status: DC | PRN

## 2018-02-06 MED ORDER — WARFARIN SODIUM 5 MG PO TABS: ORAL_TABLET | ORAL | 3 refills | Status: DC

## 2018-02-06 MED ORDER — DOXYCYCLINE HYCLATE 100 MG PO TBEC: 100.0000 mg | DELAYED_RELEASE_TABLET | Freq: Two times a day (BID) | ORAL | 0 refills | Status: AC

## 2018-02-06 NOTE — Discharge Instructions (Signed)
Please follow up with Dr. Fredna Dow in the office on Monday 1/13.  Please call his office and make an appointment to be seen.   Please hold warfarin dose today (1/12) and resume warfarin on 1/13.

## 2018-02-06 NOTE — Progress Notes (Signed)
ANTICOAGULATION CONSULT NOTE - follow up [place] for Coumadin Indication: atrial fibrillation  No Known Allergies  Patient Measurements: Height: 5\' 5"  (165.1 cm) Weight: 205 lb 11 oz (93.3 kg) IBW/kg (Calculated) : 57  Vital Signs: Temp: 98.4 F (36.9 C) (01/12 0602) Temp Source: Oral (01/12 0602) BP: 137/62 (01/12 0602) Pulse Rate: 55 (01/12 0602)  Labs: Recent Labs    02/04/18 1535 02/04/18 1903 02/05/18 0645 02/06/18 0659  HGB 10.6*  --  9.5* 9.7*  HCT 35.7*  --  32.0* 33.1*  PLT 174  --  176 178  LABPROT  --  32.3* 33.5* 32.1*  INR  --  3.21 3.36 3.18  CREATININE 1.98*  --  1.87* 1.88*    Estimated Creatinine Clearance: 26.9 mL/min (A) (by C-G formula based on SCr of 1.88 mg/dL (H)).   Medical History: Past Medical History:  Diagnosis Date  . Anxiety   . Atrial fibrillation ([place])   . CHF (congestive heart failure) ([place])   . Diabetes mellitus without complication ([place])   . Hypertension     Medications:  Medications Prior to Admission  Medication Sig Dispense Refill Last Dose  . aspirin EC 81 MG tablet Take 81 mg by mouth daily.   02/04/2018 at Unknown time  . carvedilol (COREG) 12.5 MG tablet Take 1 tablet (12.5 mg total) by mouth 2 (two) times daily. 60 tablet 2 02/04/2018 at 0930  . cefUROXime (CEFTIN) 500 MG tablet Take 500 mg by mouth 2 (two) times daily with a meal. Take 2 tables for 10 days   02/04/2018 at Unknown time  . Cholecalciferol (VITAMIN D PO) Take 1 tablet by mouth daily.   02/04/2018 at Unknown time  . COMBIGAN 0.2-0.5 % ophthalmic solution Place 1 drop into both eyes 2 (two) times daily.  6 02/04/2018 at Unknown time  . doxycycline (DORYX) 100 MG EC tablet Take 100 mg by mouth 2 (two) times daily. Take 2 tables for 10 days   02/04/2018 at Unknown time  . FEROSUL 325 (65 Fe) MG tablet Take 1 tablet by mouth 2 (two) times daily.  11 02/04/2018 at Unknown time  . furosemide (LASIX) 40 MG tablet Take 40 mg daily EXCEPT on Saturday  and Sunday, take 80 MG ( 2 pills)  May take an extra 40 mg if you gain more than 3 lbs overnight 100 tablet 3 02/04/2018 at Unknown time  . glimepiride (AMARYL) 1 MG tablet Take 1 mg by mouth daily with breakfast.   02/04/2018 at Unknown time  . guaiFENesin-codeine 100-10 MG/5ML syrup Take 1 mL by mouth 4 (four) times daily.  0 02/04/2018 at Unknown time  . LORazepam (ATIVAN) 0.5 MG tablet Take 1 tablet by mouth as needed.  5 02/04/2018 at Unknown time  . naproxen (NAPROSYN) 500 MG tablet Take 500 mg by mouth 2 (two) times daily with a meal. Take 1 tables 2 time a day for 10 days   02/04/2018 at Unknown time  . potassium chloride SA (K-DUR,KLOR-CON) 20 MEQ tablet Take 20 meq daily EXCEPT on Saturday and Sunday, take 40 meq (2 pills) (Patient taking differently: Take 20 mEq by mouth daily. Take 20 meq daily EXCEPT on Saturday and Sunday, take 40 meq (2 pills)) 30 tablet 2 02/04/2018 at Unknown time  . TRAVATAN Z 0.004 % SOLN ophthalmic solution Place 1 drop into both eyes at bedtime.  6 02/04/2018 at Unknown time  . warfarin (COUMADIN) 5 MG tablet Take 1 tablet daily except 1/2 tablet  on Fridays 90 tablet 3 02/03/2018 at 1730    Assessment: 83 yo female on chronic coumadin for afib. INR on admission is elevated and remains elevated this AM. Likely d/t antibiotics on at home.   Home dose is usually 5mg  daily except 2.5mg  on Friday.  Goal of Therapy:  INR 2-3 Monitor platelets by anticoagulation protocol: Yes   Plan:  No coumadin today PT-INR daily Monitor for s/s of bleeding  Isac Sarna, BS [place], BCPS Clinical Pharmacist Pager [phone] 02/06/2018,9:09 AM

## 2018-02-06 NOTE — Discharge Summary (Signed)
Physician Discharge Summary  Barbara SHINGLEDECKER KNL:[id number] DOB: January 22, 1936 DOA: 02/04/2018  PCP: Lemmie Evens, MD  Admit date: 02/04/2018 Discharge date: 02/06/2018  Admitted From: Home  Disposition: Home with family  Recommendations for Outpatient Follow-up:  1. Follow up with Dr. Fredna Dow (orthopedics Hand) tomorrow 1/13 2. Follow up with PCP in 1 week.  Discharge Condition: STABLE   CODE STATUS: FULL    Brief Hospitalization Summary: Please see all hospital notes, images, labs for full details of the hospitalization. Dr. Serita Grit XTK:WIOXB Barbara Stone is a 83 y.o. female with medical history significant for chronic systolic CHF (EF 35% by TTE 06/2017), atrial fibrillation on Coumadin, type 2 diabetes, hypertension, and CKD stage IV presents the ED with concern of worsening left index finger wound.  Patient first noticed wound about 1 week ago and was seen by her PCP.  She was started on doxycycline and Ceftin which she has been taking regularly.  She follow-up with her PCP day of admission 02/04/2018 told her that the swelling appeared worse and she was subsequently sent to the ED.  She says she has not really had any pain at the effected area.  She says she saw 2 or 3 small areas of swelling and erythema between the left MCP and left PIP of the left first finger.  She denies any associated fevers, chest pain, dyspnea, abdominal pain.  She report swelling in her legs which is unchanged.  ED Course:  Initial vitals showed BP 133/78, pulse 69, RR 20, temp 98.4 Fahrenheit, SPO2 100% on room air.  Labs notable for WBC 4.7, hemoglobin 10.6, platelets 174, creatinine 1.98.  Patient underwent I&D of left index finger wounds with noted purulent drainage.  IV vancomycin was started and the hospital service was consulted to admit for further management.  Brief Admission Hx: 83 y.o.femalewith medical history significant forchronic systolic CHF (EF 32% by TTE 06/2017), atrial fibrillation on Coumadin,  type 2 diabetes, hypertension, and CKD stage IV presents the ED with concern of worsening left index finger wound.   MDM/Assessment & Plan:   1. Left index finger abscess - Pt is s/p I&D in ED.  I was able to express more purulent fluid from wound this morning during rounds.  I spoke with Hand surgeon Dr. Fredna Dow.  I reviewed case with him.  He said that patient can remain at [facility] for IV abx and could discharge home on oral doxycycline and follow up with him in office on Monday.  He said that if we could get hydrotherapy from PT that would be beneficial but we had no hydrotherapy services available at [facility] this weekend.  Wound is improving.  Pt says pain is better, swelling is better.  Discharge home today to follow up with Dr. Fredna Dow tomorrow 1/13 in his office.  Rx doxycycline given.   2. Atrial fibrillation - Pt is fully anticoagulated on warfarin.  Continue coreg for rate control.  Per pharm D hold warfarin today.  3. Chronic systolic CHF - stable at baseline per patient and family.  Continue home meds.  4. Type 2 DM - SSI while inpatient.  5. Essential hypertension - stable, continue home meds.  6. Stage 4 CKD - stable renal function.    DVT prophylaxis: warfarin Code Status: Full  Family Communication: daughters at bedside  Disposition Plan: home   Consultants:  Phone (hand orthopedics - Dr. Fredna Dow)  Procedures:  I&D   Antimicrobials:  Vancomycin 1/10>1/12  Discharge Diagnoses:  Principal Problem:   Cellulitis  of left hand Active Problems:   Atrial fibrillation (HCC) [T01.60]   Chronic systolic CHF (congestive heart failure) (HCC)   HTN (hypertension)   Chronic kidney disease (CKD), stage IV (severe) (HCC)   Diabetes mellitus without complication [facility])  Discharge Instructions: Discharge Instructions    Call MD for:  difficulty breathing, headache or visual disturbances   Complete by:  As directed    Call MD for:  extreme fatigue   Complete by:  As directed    Call MD  for:  persistant nausea and vomiting   Complete by:  As directed    Discharge instructions   Complete by:  As directed    Leave dressing in place until you follow up with Dr. Fredna Dow tomorrow in the office.   Increase activity slowly   Complete by:  As directed      Allergies as of 02/06/2018   No Known Allergies     Medication List    STOP taking these medications   cefUROXime 500 MG tablet Commonly known as:  CEFTIN   naproxen 500 MG tablet Commonly known as:  NAPROSYN     TAKE these medications   aspirin EC 81 MG tablet Take 81 mg by mouth daily.   carvedilol 12.5 MG tablet Commonly known as:  COREG Take 1 tablet (12.5 mg total) by mouth 2 (two) times daily.   COMBIGAN 0.2-0.5 % ophthalmic solution Generic drug:  brimonidine-timolol Place 1 drop into both eyes 2 (two) times daily.   doxycycline 100 MG EC tablet Commonly known as:  DORYX Take 1 tablet (100 mg total) by mouth 2 (two) times daily for 7 days. Take 2 tables for 10 days   FEROSUL 325 (65 FE) MG tablet Generic drug:  ferrous sulfate Take 1 tablet by mouth 2 (two) times daily.   furosemide 40 MG tablet Commonly known as:  LASIX Take 40 mg daily EXCEPT on Saturday and Sunday, take 80 MG ( 2 pills)  May take an extra 40 mg if you gain more than 3 lbs overnight   glimepiride 1 MG tablet Commonly known as:  AMARYL Take 1 mg by mouth daily with breakfast.   guaiFENesin-codeine 100-10 MG/5ML syrup Take 1 mL by mouth 4 (four) times daily.   LORazepam 0.5 MG tablet Commonly known as:  ATIVAN Take 1 tablet by mouth as needed.   potassium chloride SA 20 MEQ tablet Commonly known as:  K-DUR,KLOR-CON Take 20 meq daily EXCEPT on Saturday and Sunday, take 40 meq (2 pills) What changed:    how much to take  how to take this  when to take this   traMADol 50 MG tablet Commonly known as:  ULTRAM Take 1 tablet (50 mg total) by mouth every 6 (six) hours as needed for severe pain.   TRAVATAN Z 0.004 %  Soln ophthalmic solution Generic drug:  Travoprost (BAK Free) Place 1 drop into both eyes at bedtime.   VITAMIN D PO Take 1 tablet by mouth daily.   warfarin 5 MG tablet Commonly known as:  COUMADIN Take as directed. If you are unsure how to take this medication, talk to your nurse or doctor. Original instructions:  Take 1 tablet daily except 1/2 tablet on Fridays Start taking on:  February 07, 2018      Follow-up Information    Daryll Brod, MD. Schedule an appointment as soon as possible for a visit in 1 day(s).   Specialty:  Orthopedic Surgery Why:  Hospital Follow Up for finger  infection  Contact information: [place] [address] [phone]        Lemmie Evens, MD. Schedule an appointment as soon as possible for a visit in 1 week(s).   Specialty:  Family Medicine Contact information: [place] [address] [phone]        Herminio Commons, MD .   Specialty:  Cardiology Contact information: [place] [address] [phone]          No Known Allergies Allergies as of 02/06/2018   No Known Allergies     Medication List    STOP taking these medications   cefUROXime 500 MG tablet Commonly known as:  CEFTIN   naproxen 500 MG tablet Commonly known as:  NAPROSYN     TAKE these medications   aspirin EC 81 MG tablet Take 81 mg by mouth daily.   carvedilol 12.5 MG tablet Commonly known as:  COREG Take 1 tablet (12.5 mg total) by mouth 2 (two) times daily.   COMBIGAN 0.2-0.5 % ophthalmic solution Generic drug:  brimonidine-timolol Place 1 drop into both eyes 2 (two) times daily.   doxycycline 100 MG EC tablet Commonly known as:  DORYX Take 1 tablet (100 mg total) by mouth 2 (two) times daily for 7 days. Take 2 tables for 10 days   FEROSUL 325 (65 FE) MG tablet Generic drug:  ferrous sulfate Take 1 tablet by mouth 2 (two) times daily.   furosemide 40 MG tablet Commonly known as:   LASIX Take 40 mg daily EXCEPT on Saturday and Sunday, take 80 MG ( 2 pills)  May take an extra 40 mg if you gain more than 3 lbs overnight   glimepiride 1 MG tablet Commonly known as:  AMARYL Take 1 mg by mouth daily with breakfast.   guaiFENesin-codeine 100-10 MG/5ML syrup Take 1 mL by mouth 4 (four) times daily.   LORazepam 0.5 MG tablet Commonly known as:  ATIVAN Take 1 tablet by mouth as needed.   potassium chloride SA 20 MEQ tablet Commonly known as:  K-DUR,KLOR-CON Take 20 meq daily EXCEPT on Saturday and Sunday, take 40 meq (2 pills) What changed:    how much to take  how to take this  when to take this   traMADol 50 MG tablet Commonly known as:  ULTRAM Take 1 tablet (50 mg total) by mouth every 6 (six) hours as needed for severe pain.   TRAVATAN Z 0.004 % Soln ophthalmic solution Generic drug:  Travoprost (BAK Free) Place 1 drop into both eyes at bedtime.   VITAMIN D PO Take 1 tablet by mouth daily.   warfarin 5 MG tablet Commonly known as:  COUMADIN Take as directed. If you are unsure how to take this medication, talk to your nurse or doctor. Original instructions:  Take 1 tablet daily except 1/2 tablet on Fridays Start taking on:  February 07, 2018      Procedures/Studies: Dg Hand Complete Left  Result Date: 01/28/2018 CLINICAL DATA:  83 year old with swelling and pain and left index finger. EXAM: LEFT HAND - COMPLETE 3+ VIEW COMPARISON:  11/14/2016 FINDINGS: Focal soft tissue swelling involving the index finger PIP joint. There is a small bone fragment or loose body near the dorsal aspect of the index finger PIP joint which appears chronic. However, there is mild irregularity and haziness involving the distal aspect of the proximal phalanx that appears to be new and concern for a small area of periosteal reaction  or cortical irregularity along the ulnar aspect of the proximal phalanx. There is no significant joint space narrowing at the index finger PIP joint.  Mild joint space narrowing at the index finger DIP joint appears chronic. Negative for fracture or dislocation. IMPRESSION: Significant soft tissue swelling around the index finger PIP joint. Although there is no significant arthropathy at this level, there is concern for new mild cortical irregularity involving the distal aspect of the index finger proximal phalanx. Findings raise concern for an inflammatory or infectious process and consider further characterization with an MRI. Electronically Signed   By: Markus Daft M.D.   On: 01/28/2018 16:39      Subjective: Pt reports that finger feels better today.  Pain and swelling and drainage much better today.  Discharge Exam: Vitals:   02/05/18 2235 02/06/18 0602  BP:  137/62  Pulse:  (!) 55  Resp:  18  Temp:  98.4 F (36.9 C)  SpO2: 99% 99%   Vitals:   02/05/18 2128 02/05/18 2235 02/06/18 0500 02/06/18 0602  BP: 121/71   137/62  Pulse: 62   (!) 55  Resp: 18   18  Temp: 98.5 F (36.9 C)   98.4 F (36.9 C)  TempSrc: Oral   Oral  SpO2: 98% 99%  99%  Weight:   93.3 kg   Height:       General exam: awake, alert, NAD, cooperative.  Respiratory system: Clear. No increased work of breathing. Cardiovascular system: normal S1 & S2 heard. No JVD, murmurs, gallops, clicks or pedal edema. Gastrointestinal system: Abdomen is nondistended, soft and nontender. Normal bowel sounds heard. Central nervous system: Alert and oriented. No focal neurological deficits. Extremities: left index finger wound: expressed purulent drainage, wound appears to be less inflamed, but less edematous, sensation intact, good blood flow. Pt able to flex finger without difficulty.    The results of significant diagnostics from this hospitalization (including imaging, microbiology, ancillary and laboratory) are listed below for reference.     Microbiology: No results found for this or any previous visit (from the past 240 hour(s)).   Labs: BNP (last 3  results) Recent Labs    03/24/17 0712 05/23/17 1122 06/27/17 1651  BNP 891.0* 970.0* 7,673.4*   Basic Metabolic Panel: Recent Labs  Lab 02/04/18 1535 02/05/18 0645 02/06/18 0659  NA 140 142 140  K 4.3 4.0 3.7  CL 109 110 108  CO2 19* 23 21*  GLUCOSE 95 82 97  BUN 71* 70* 61*  CREATININE 1.98* 1.87* 1.88*  CALCIUM 8.8* 8.9 8.7*   Liver Function Tests: No results for input(s): AST, ALT, ALKPHOS, BILITOT, PROT, ALBUMIN in the last 168 hours. No results for input(s): LIPASE, AMYLASE in the last 168 hours. No results for input(s): AMMONIA in the last 168 hours. CBC: Recent Labs  Lab 02/04/18 1535 02/05/18 0645 02/06/18 0659  WBC 4.7 4.4 4.1  NEUTROABS 3.4  --   --   HGB 10.6* 9.5* 9.7*  HCT 35.7* 32.0* 33.1*  MCV 101.1* 101.9* 101.2*  PLT 174 176 178   Cardiac Enzymes: No results for input(s): CKTOTAL, CKMB, CKMBINDEX, TROPONINI in the last 168 hours. BNP: Invalid input(s): POCBNP CBG: Recent Labs  Lab 02/05/18 1141 02/05/18 1652 02/05/18 2127 02/06/18 0735 02/06/18 1113  GLUCAP 134* 178* 104* 100* 149*   D-Dimer No results for input(s): DDIMER in the last 72 hours. Hgb A1c Recent Labs    02/04/18 1535  HGBA1C 6.8*   Lipid Profile No results for input(s):  CHOL, HDL, LDLCALC, TRIG, CHOLHDL, LDLDIRECT in the last 72 hours. Thyroid function studies No results for input(s): TSH, T4TOTAL, T3FREE, THYROIDAB in the last 72 hours.  Invalid input(s): FREET3 Anemia work up No results for input(s): VITAMINB12, FOLATE, FERRITIN, TIBC, IRON, RETICCTPCT in the last 72 hours. Urinalysis    Component Value Date/Time   COLORURINE YELLOW 06/27/2017 2300   APPEARANCEUR CLEAR 06/27/2017 2300   LABSPEC 1.010 06/27/2017 2300   PHURINE 5.0 06/27/2017 2300   GLUCOSEU NEGATIVE 06/27/2017 2300   HGBUR NEGATIVE 06/27/2017 2300   BILIRUBINUR NEGATIVE 06/27/2017 2300   KETONESUR NEGATIVE 06/27/2017 2300   PROTEINUR NEGATIVE 06/27/2017 2300   UROBILINOGEN 0.2 09/06/2006  1425   NITRITE NEGATIVE 06/27/2017 2300   LEUKOCYTESUR NEGATIVE 06/27/2017 2300   Sepsis Labs Invalid input(s): PROCALCITONIN,  WBC,  LACTICIDVEN Microbiology No results found for this or any previous visit (from the past 240 hour(s)).  Time coordinating discharge:   SIGNED:  Irwin Brakeman, MD  [facility] 02/06/2018, 11:28 AM Pager [phone]  If 7PM-7AM, please contact night-coverage [email or web address] Password [email or web address]

## 2018-02-10 ENCOUNTER — Encounter (INDEPENDENT_AMBULATORY_CARE_PROVIDER_SITE_OTHER): Payer: Medicare Other | Admitting: Ophthalmology

## 2018-02-11 ENCOUNTER — Ambulatory Visit (HOSPITAL_COMMUNITY): Payer: Medicare Other

## 2018-02-11 ENCOUNTER — Encounter (HOSPITAL_COMMUNITY): Payer: Medicare Other

## 2018-02-14 ENCOUNTER — Other Ambulatory Visit (HOSPITAL_COMMUNITY)
Admission: RE | Admit: 2018-02-14 | Discharge: 2018-02-14 | Disposition: A | Discharge status: Home / Self Care | Payer: Medicare Other | Source: Ambulatory Visit | Attending: Family Medicine | Admitting: Family Medicine

## 2018-02-14 ENCOUNTER — Ambulatory Visit (INDEPENDENT_AMBULATORY_CARE_PROVIDER_SITE_OTHER): Payer: Medicare Other | Admitting: Pharmacist

## 2018-02-14 DIAGNOSIS — Z5181 Encounter for therapeutic drug level monitoring: Secondary | ICD-10-CM

## 2018-02-14 DIAGNOSIS — E11319 Type 2 diabetes mellitus with unspecified diabetic retinopathy without macular edema: Secondary | ICD-10-CM | POA: Insufficient documentation

## 2018-02-14 DIAGNOSIS — I4891 Unspecified atrial fibrillation: Secondary | ICD-10-CM

## 2018-02-14 LAB — CBC WITH DIFFERENTIAL/PLATELET
Abs Immature Granulocytes: 0.02 10*3/uL (ref 0.00–0.07)
Basophils Absolute: 0 10*3/uL (ref 0.0–0.1)
Basophils Relative: 0 %
Eosinophils Absolute: 0.2 10*3/uL (ref 0.0–0.5)
Eosinophils Relative: 3 %
HCT: 33.9 % — ABNORMAL LOW (ref 36.0–46.0)
Hemoglobin: 10 g/dL — ABNORMAL LOW (ref 12.0–15.0)
Immature Granulocytes: 0 %
Lymphocytes Relative: 13 %
Lymphs Abs: 0.6 10*3/uL — ABNORMAL LOW (ref 0.7–4.0)
MCH: 30.6 pg (ref 26.0–34.0)
MCHC: 29.5 g/dL — ABNORMAL LOW (ref 30.0–36.0)
MCV: 103.7 fL — ABNORMAL HIGH (ref 80.0–100.0)
Monocytes Absolute: 0.4 10*3/uL (ref 0.1–1.0)
Monocytes Relative: 9 %
NRBC: 0 % (ref 0.0–0.2)
Neutro Abs: 3.4 10*3/uL (ref 1.7–7.7)
Neutrophils Relative %: 75 %
Platelets: 190 10*3/uL (ref 150–400)
RBC: 3.27 MIL/uL — ABNORMAL LOW (ref 3.87–5.11)
RDW: 16.9 % — ABNORMAL HIGH (ref 11.5–15.5)
WBC: 4.5 10*3/uL (ref 4.0–10.5)

## 2018-02-14 LAB — COMPREHENSIVE METABOLIC PANEL
ALBUMIN: 3.6 g/dL (ref 3.5–5.0)
ALT: 12 U/L (ref 0–44)
AST: 19 U/L (ref 15–41)
Alkaline Phosphatase: 74 U/L (ref 38–126)
Anion gap: 10 (ref 5–15)
BILIRUBIN TOTAL: 1.5 mg/dL — AB (ref 0.3–1.2)
BUN: 51 mg/dL — ABNORMAL HIGH (ref 8–23)
CO2: 27 mmol/L (ref 22–32)
Calcium: 9 mg/dL (ref 8.9–10.3)
Chloride: 101 mmol/L (ref 98–111)
Creatinine, Ser: 2.22 mg/dL — ABNORMAL HIGH (ref 0.44–1.00)
GFR calc non Af Amer: 20 mL/min — ABNORMAL LOW (ref >60)
GFR, EST AFRICAN AMERICAN: 23 mL/min — AB (ref >60)
Glucose, Bld: 237 mg/dL — ABNORMAL HIGH (ref 70–99)
Potassium: 4.2 mmol/L (ref 3.5–5.1)
Sodium: 138 mmol/L (ref 135–145)
Total Protein: 7 g/dL (ref 6.5–8.1)

## 2018-02-14 LAB — LIPID PANEL
Cholesterol: 142 mg/dL (ref 0–200)
HDL: 35 mg/dL — ABNORMAL LOW (ref >40)
LDL Cholesterol: 94 mg/dL (ref 0–99)
TRIGLYCERIDES: 65 mg/dL (ref <150)
Total CHOL/HDL Ratio: 4.1 RATIO
VLDL: 13 mg/dL (ref 0–40)

## 2018-02-14 LAB — POCT INR: INR: 2.9 (ref 2.0–3.0)

## 2018-02-14 LAB — HEMOGLOBIN A1C
Hgb A1c MFr Bld: 6.6 % — ABNORMAL HIGH (ref 4.8–5.6)
Mean Plasma Glucose: 142.72 mg/dL

## 2018-02-14 NOTE — Patient Instructions (Signed)
Description   Continue 1 tablet daily except 1/2 tablet on Fridays Continue greens/salads.  Recheck in 4 weeks  Call if started on any new antibiotics

## 2018-03-14 ENCOUNTER — Encounter (INDEPENDENT_AMBULATORY_CARE_PROVIDER_SITE_OTHER): Payer: Medicare Other | Admitting: Ophthalmology

## 2018-03-14 DIAGNOSIS — E113591 Type 2 diabetes mellitus with proliferative diabetic retinopathy without macular edema, right eye: Secondary | ICD-10-CM

## 2018-03-14 DIAGNOSIS — E11311 Type 2 diabetes mellitus with unspecified diabetic retinopathy with macular edema: Secondary | ICD-10-CM | POA: Diagnosis not present

## 2018-03-14 DIAGNOSIS — E113512 Type 2 diabetes mellitus with proliferative diabetic retinopathy with macular edema, left eye: Secondary | ICD-10-CM

## 2018-03-14 DIAGNOSIS — I10 Essential (primary) hypertension: Secondary | ICD-10-CM | POA: Diagnosis not present

## 2018-03-14 DIAGNOSIS — H35033 Hypertensive retinopathy, bilateral: Secondary | ICD-10-CM

## 2018-03-14 DIAGNOSIS — H43813 Vitreous degeneration, bilateral: Secondary | ICD-10-CM

## 2018-03-16 ENCOUNTER — Ambulatory Visit (INDEPENDENT_AMBULATORY_CARE_PROVIDER_SITE_OTHER): Payer: Medicare Other | Admitting: *Deleted

## 2018-03-16 ENCOUNTER — Other Ambulatory Visit (HOSPITAL_COMMUNITY)
Admission: RE | Admit: 2018-03-16 | Discharge: 2018-03-16 | Disposition: A | Discharge status: Home / Self Care | Payer: Medicare Other | Source: Ambulatory Visit | Attending: Nurse Practitioner | Admitting: Nurse Practitioner

## 2018-03-16 DIAGNOSIS — Z5181 Encounter for therapeutic drug level monitoring: Secondary | ICD-10-CM | POA: Diagnosis not present

## 2018-03-16 DIAGNOSIS — I4891 Unspecified atrial fibrillation: Secondary | ICD-10-CM | POA: Diagnosis not present

## 2018-03-16 DIAGNOSIS — M109 Gout, unspecified: Secondary | ICD-10-CM | POA: Insufficient documentation

## 2018-03-16 LAB — POCT INR: INR: 4.3 — AB (ref 2.0–3.0)

## 2018-03-16 LAB — URIC ACID: Uric Acid, Serum: 10.1 mg/dL — ABNORMAL HIGH (ref 2.5–7.1)

## 2018-03-16 NOTE — Patient Instructions (Signed)
Started on medicine for gout 03/12/18 Hold coumadin tonight then decrease dose to 1 tablet daily except 1/2 tablet on Tuesdays, Thursdays and Saturdays Continue greens/salads.  Recheck in 3 weeks

## 2018-04-06 ENCOUNTER — Other Ambulatory Visit: Payer: Self-pay

## 2018-04-06 ENCOUNTER — Ambulatory Visit (INDEPENDENT_AMBULATORY_CARE_PROVIDER_SITE_OTHER): Payer: Medicare Other | Admitting: *Deleted

## 2018-04-06 DIAGNOSIS — I4891 Unspecified atrial fibrillation: Secondary | ICD-10-CM | POA: Diagnosis not present

## 2018-04-06 DIAGNOSIS — Z5181 Encounter for therapeutic drug level monitoring: Secondary | ICD-10-CM

## 2018-04-06 LAB — POCT INR: INR: 3.3 — AB (ref 2.0–3.0)

## 2018-04-06 NOTE — Patient Instructions (Signed)
Started on medicine for gout 03/12/18 Take coumadin 1/2 tablet tonight then decrease dose to 1/2 tablet daily except 1 tablet on Mondays, Wednesdays and Fridays.  Recheck in 4 weeks

## 2018-05-02 ENCOUNTER — Telehealth: Payer: Self-pay | Admitting: Cardiovascular Disease

## 2018-05-02 NOTE — Telephone Encounter (Signed)
Called pt.  Told her to continue current dose until follow up INR check on 05/11/18.  Pt wrote instructions down and states she understands.

## 2018-05-02 NOTE — Telephone Encounter (Signed)
patient called asking about her dose until she comes to see [facility]

## 2018-05-11 ENCOUNTER — Ambulatory Visit (INDEPENDENT_AMBULATORY_CARE_PROVIDER_SITE_OTHER): Payer: Medicare Other | Admitting: *Deleted

## 2018-05-11 ENCOUNTER — Other Ambulatory Visit: Payer: Self-pay

## 2018-05-11 DIAGNOSIS — I4891 Unspecified atrial fibrillation: Secondary | ICD-10-CM

## 2018-05-11 DIAGNOSIS — Z5181 Encounter for therapeutic drug level monitoring: Secondary | ICD-10-CM | POA: Diagnosis not present

## 2018-05-11 LAB — POCT INR: INR: 2.4 (ref 2.0–3.0)

## 2018-05-11 NOTE — Patient Instructions (Signed)
Started on medicine for gout 03/12/18 Continue coumadin 1/2 tablet daily except 1 tablet on Mondays, Wednesdays and Fridays.  Recheck in 4 weeks

## 2018-05-23 ENCOUNTER — Other Ambulatory Visit: Payer: Self-pay

## 2018-05-23 ENCOUNTER — Encounter (INDEPENDENT_AMBULATORY_CARE_PROVIDER_SITE_OTHER): Payer: Medicare Other | Admitting: Ophthalmology

## 2018-05-23 DIAGNOSIS — E11311 Type 2 diabetes mellitus with unspecified diabetic retinopathy with macular edema: Secondary | ICD-10-CM

## 2018-05-23 DIAGNOSIS — H35033 Hypertensive retinopathy, bilateral: Secondary | ICD-10-CM

## 2018-05-23 DIAGNOSIS — E113591 Type 2 diabetes mellitus with proliferative diabetic retinopathy without macular edema, right eye: Secondary | ICD-10-CM | POA: Diagnosis not present

## 2018-05-23 DIAGNOSIS — I10 Essential (primary) hypertension: Secondary | ICD-10-CM | POA: Diagnosis not present

## 2018-05-23 DIAGNOSIS — E113512 Type 2 diabetes mellitus with proliferative diabetic retinopathy with macular edema, left eye: Secondary | ICD-10-CM

## 2018-05-23 DIAGNOSIS — H43811 Vitreous degeneration, right eye: Secondary | ICD-10-CM

## 2018-06-06 ENCOUNTER — Telehealth: Payer: Self-pay | Admitting: Cardiovascular Disease

## 2018-06-06 NOTE — Telephone Encounter (Signed)
Info given to daughter, she thanked me and told us to stay safe

## 2018-06-06 NOTE — Telephone Encounter (Signed)
03/20/2016 (or thereabouts)

## 2018-06-06 NOTE — Telephone Encounter (Signed)
Wanted to know exact date she was diagnosed with CHF

## 2018-06-06 NOTE — Telephone Encounter (Signed)
I will defer that to Dr Bronson Ing

## 2018-06-07 ENCOUNTER — Telehealth: Payer: Self-pay | Admitting: *Deleted

## 2018-06-07 NOTE — Telephone Encounter (Signed)

## 2018-06-08 ENCOUNTER — Ambulatory Visit (INDEPENDENT_AMBULATORY_CARE_PROVIDER_SITE_OTHER): Payer: Medicare Other | Admitting: *Deleted

## 2018-06-08 DIAGNOSIS — I4891 Unspecified atrial fibrillation: Secondary | ICD-10-CM | POA: Diagnosis not present

## 2018-06-08 DIAGNOSIS — Z5181 Encounter for therapeutic drug level monitoring: Secondary | ICD-10-CM | POA: Diagnosis not present

## 2018-06-08 LAB — POCT INR: INR: 2.4 (ref 2.0–3.0)

## 2018-06-08 NOTE — Patient Instructions (Signed)
Started on medicine for gout 03/12/18 Continue coumadin 1/2 tablet daily except 1 tablet on Mondays, Wednesdays and Fridays.  Recheck in 4 weeks

## 2018-06-13 ENCOUNTER — Ambulatory Visit (HOSPITAL_COMMUNITY)
Admission: RE | Admit: 2018-06-13 | Discharge: 2018-06-13 | Disposition: A | Discharge status: Home / Self Care | Payer: Medicare Other | Source: Ambulatory Visit | Attending: Family Medicine | Admitting: Family Medicine

## 2018-06-13 ENCOUNTER — Other Ambulatory Visit (HOSPITAL_COMMUNITY): Payer: Self-pay | Admitting: Family Medicine

## 2018-06-13 ENCOUNTER — Other Ambulatory Visit: Payer: Self-pay

## 2018-06-13 DIAGNOSIS — M79672 Pain in left foot: Secondary | ICD-10-CM | POA: Diagnosis present

## 2018-06-13 DIAGNOSIS — M25572 Pain in left ankle and joints of left foot: Secondary | ICD-10-CM | POA: Insufficient documentation

## 2018-07-05 ENCOUNTER — Telehealth: Payer: Self-pay | Admitting: *Deleted

## 2018-07-05 NOTE — Telephone Encounter (Signed)

## 2018-07-06 ENCOUNTER — Ambulatory Visit (INDEPENDENT_AMBULATORY_CARE_PROVIDER_SITE_OTHER): Payer: Medicare Other | Admitting: *Deleted

## 2018-07-06 DIAGNOSIS — I4891 Unspecified atrial fibrillation: Secondary | ICD-10-CM | POA: Diagnosis not present

## 2018-07-06 DIAGNOSIS — Z5181 Encounter for therapeutic drug level monitoring: Secondary | ICD-10-CM

## 2018-07-06 LAB — POCT INR: INR: 3.2 — AB (ref 2.0–3.0)

## 2018-07-06 NOTE — Patient Instructions (Signed)
Take coumadin 1/2 tablet tonight then resume 1/2 tablet daily except 1 tablet on Mondays, Wednesdays and Fridays.  Recheck in 4 weeks

## 2018-07-15 ENCOUNTER — Other Ambulatory Visit: Payer: Self-pay

## 2018-07-15 ENCOUNTER — Encounter (INDEPENDENT_AMBULATORY_CARE_PROVIDER_SITE_OTHER): Payer: Medicare Other | Admitting: Ophthalmology

## 2018-07-15 DIAGNOSIS — H35033 Hypertensive retinopathy, bilateral: Secondary | ICD-10-CM | POA: Diagnosis not present

## 2018-07-15 DIAGNOSIS — I10 Essential (primary) hypertension: Secondary | ICD-10-CM | POA: Diagnosis not present

## 2018-07-15 DIAGNOSIS — E11311 Type 2 diabetes mellitus with unspecified diabetic retinopathy with macular edema: Secondary | ICD-10-CM

## 2018-07-15 DIAGNOSIS — H43811 Vitreous degeneration, right eye: Secondary | ICD-10-CM

## 2018-07-15 DIAGNOSIS — E113513 Type 2 diabetes mellitus with proliferative diabetic retinopathy with macular edema, bilateral: Secondary | ICD-10-CM

## 2018-08-03 ENCOUNTER — Emergency Department (HOSPITAL_COMMUNITY): Payer: Medicare Other

## 2018-08-03 ENCOUNTER — Encounter (HOSPITAL_COMMUNITY): Payer: Self-pay

## 2018-08-03 ENCOUNTER — Ambulatory Visit (INDEPENDENT_AMBULATORY_CARE_PROVIDER_SITE_OTHER): Payer: Medicare Other | Admitting: *Deleted

## 2018-08-03 ENCOUNTER — Other Ambulatory Visit: Payer: Self-pay

## 2018-08-03 ENCOUNTER — Inpatient Hospital Stay (HOSPITAL_COMMUNITY)
Admission: EM | Admit: 2018-08-03 | Discharge: 2018-08-06 | DRG: 291 | Disposition: A | Discharge status: Home / Self Care | Payer: Medicare Other | Attending: Family Medicine | Admitting: Family Medicine

## 2018-08-03 DIAGNOSIS — I5023 Acute on chronic systolic (congestive) heart failure: Secondary | ICD-10-CM | POA: Diagnosis not present

## 2018-08-03 DIAGNOSIS — Z7982 Long term (current) use of aspirin: Secondary | ICD-10-CM

## 2018-08-03 DIAGNOSIS — I4891 Unspecified atrial fibrillation: Secondary | ICD-10-CM

## 2018-08-03 DIAGNOSIS — E1122 Type 2 diabetes mellitus with diabetic chronic kidney disease: Secondary | ICD-10-CM | POA: Diagnosis present

## 2018-08-03 DIAGNOSIS — Z5181 Encounter for therapeutic drug level monitoring: Secondary | ICD-10-CM | POA: Diagnosis not present

## 2018-08-03 DIAGNOSIS — Z79899 Other long term (current) drug therapy: Secondary | ICD-10-CM

## 2018-08-03 DIAGNOSIS — N184 Chronic kidney disease, stage 4 (severe): Secondary | ICD-10-CM | POA: Diagnosis present

## 2018-08-03 DIAGNOSIS — H9190 Unspecified hearing loss, unspecified ear: Secondary | ICD-10-CM | POA: Diagnosis present

## 2018-08-03 DIAGNOSIS — I130 Hypertensive heart and chronic kidney disease with heart failure and stage 1 through stage 4 chronic kidney disease, or unspecified chronic kidney disease: Secondary | ICD-10-CM | POA: Diagnosis not present

## 2018-08-03 DIAGNOSIS — D509 Iron deficiency anemia, unspecified: Secondary | ICD-10-CM | POA: Diagnosis present

## 2018-08-03 DIAGNOSIS — I482 Chronic atrial fibrillation, unspecified: Secondary | ICD-10-CM | POA: Diagnosis present

## 2018-08-03 DIAGNOSIS — I10 Essential (primary) hypertension: Secondary | ICD-10-CM | POA: Diagnosis present

## 2018-08-03 DIAGNOSIS — Z7901 Long term (current) use of anticoagulants: Secondary | ICD-10-CM

## 2018-08-03 DIAGNOSIS — R0602 Shortness of breath: Secondary | ICD-10-CM

## 2018-08-03 DIAGNOSIS — Z7984 Long term (current) use of oral hypoglycemic drugs: Secondary | ICD-10-CM

## 2018-08-03 DIAGNOSIS — I509 Heart failure, unspecified: Secondary | ICD-10-CM

## 2018-08-03 DIAGNOSIS — Z1159 Encounter for screening for other viral diseases: Secondary | ICD-10-CM

## 2018-08-03 LAB — CBC WITH DIFFERENTIAL/PLATELET
Abs Immature Granulocytes: 0.02 10*3/uL (ref 0.00–0.07)
Basophils Absolute: 0 10*3/uL (ref 0.0–0.1)
Basophils Relative: 0 %
Eosinophils Absolute: 0.2 10*3/uL (ref 0.0–0.5)
Eosinophils Relative: 4 %
HCT: 31.5 % — ABNORMAL LOW (ref 36.0–46.0)
Hemoglobin: 9.3 g/dL — ABNORMAL LOW (ref 12.0–15.0)
Immature Granulocytes: 0 %
Lymphocytes Relative: 10 %
Lymphs Abs: 0.5 10*3/uL — ABNORMAL LOW (ref 0.7–4.0)
MCH: 27.9 pg (ref 26.0–34.0)
MCHC: 29.5 g/dL — ABNORMAL LOW (ref 30.0–36.0)
MCV: 94.6 fL (ref 80.0–100.0)
Monocytes Absolute: 0.4 10*3/uL (ref 0.1–1.0)
Monocytes Relative: 9 %
Neutro Abs: 3.5 10*3/uL (ref 1.7–7.7)
Neutrophils Relative %: 77 %
Platelets: 174 10*3/uL (ref 150–400)
RBC: 3.33 MIL/uL — ABNORMAL LOW (ref 3.87–5.11)
RDW: 18.2 % — ABNORMAL HIGH (ref 11.5–15.5)
WBC: 4.6 10*3/uL (ref 4.0–10.5)
nRBC: 0 % (ref 0.0–0.2)

## 2018-08-03 LAB — BRAIN NATRIURETIC PEPTIDE: B Natriuretic Peptide: 933 pg/mL — ABNORMAL HIGH (ref 0.0–100.0)

## 2018-08-03 LAB — COMPREHENSIVE METABOLIC PANEL
ALT: 11 U/L (ref 0–44)
AST: 16 U/L (ref 15–41)
Albumin: 3.8 g/dL (ref 3.5–5.0)
Alkaline Phosphatase: 110 U/L (ref 38–126)
Anion gap: 12 (ref 5–15)
BUN: 65 mg/dL — ABNORMAL HIGH (ref 8–23)
CO2: 23 mmol/L (ref 22–32)
Calcium: 8.7 mg/dL — ABNORMAL LOW (ref 8.9–10.3)
Chloride: 104 mmol/L (ref 98–111)
Creatinine, Ser: 2.2 mg/dL — ABNORMAL HIGH (ref 0.44–1.00)
GFR calc Af Amer: 24 mL/min — ABNORMAL LOW (ref >60)
GFR calc non Af Amer: 20 mL/min — ABNORMAL LOW (ref >60)
Glucose, Bld: 190 mg/dL — ABNORMAL HIGH (ref 70–99)
Potassium: 4.1 mmol/L (ref 3.5–5.1)
Sodium: 139 mmol/L (ref 135–145)
Total Bilirubin: 1.3 mg/dL — ABNORMAL HIGH (ref 0.3–1.2)
Total Protein: 7.1 g/dL (ref 6.5–8.1)

## 2018-08-03 LAB — GLUCOSE, CAPILLARY
Glucose-Capillary: 159 mg/dL — ABNORMAL HIGH (ref 70–99)
Glucose-Capillary: 121 mg/dL — ABNORMAL HIGH (ref 70–99)

## 2018-08-03 LAB — TROPONIN I (HIGH SENSITIVITY)
Troponin I (High Sensitivity): 21 ng/L — ABNORMAL HIGH (ref <18)
Troponin I (High Sensitivity): 20 ng/L — ABNORMAL HIGH (ref <18)

## 2018-08-03 LAB — SARS CORONAVIRUS 2 BY RT PCR (HOSPITAL ORDER, PERFORMED IN ~~LOC~~ HOSPITAL LAB): SARS Coronavirus 2: NEGATIVE

## 2018-08-03 LAB — POCT INR: INR: 3.3 — AB (ref 2.0–3.0)

## 2018-08-03 MED ORDER — TRAMADOL HCL 50 MG PO TABS: 50.0000 mg | ORAL_TABLET | Freq: Four times a day (QID) | ORAL | Status: DC | PRN

## 2018-08-03 MED ORDER — GLIMEPIRIDE 2 MG PO TABS
1.0000 mg | ORAL_TABLET | Freq: Every day | ORAL | Status: DC
  Administered 2018-08-04 – 2018-08-06 (×3): 1 mg via ORAL
  Filled 2018-08-03 (×5): qty 1

## 2018-08-03 MED ORDER — FERROUS SULFATE 325 (65 FE) MG PO TABS
325.0000 mg | ORAL_TABLET | Freq: Two times a day (BID) | ORAL | Status: DC
  Filled 2018-08-03 (×8): qty 1

## 2018-08-03 MED ORDER — INSULIN ASPART 100 UNIT/ML ~~LOC~~ SOLN: 0.0000 [IU] | Freq: Every day | SUBCUTANEOUS | Status: DC

## 2018-08-03 MED ORDER — ACETAMINOPHEN 650 MG RE SUPP: 650.0000 mg | Freq: Four times a day (QID) | RECTAL | Status: DC | PRN

## 2018-08-03 MED ORDER — POTASSIUM CHLORIDE CRYS ER 20 MEQ PO TBCR
40.0000 meq | EXTENDED_RELEASE_TABLET | Freq: Every day | ORAL | Status: DC
  Administered 2018-08-03 – 2018-08-06 (×4): 40 meq via ORAL
  Filled 2018-08-03 (×4): qty 2

## 2018-08-03 MED ORDER — SODIUM CHLORIDE 0.9 % IV SOLN: 250.0000 mL | INTRAVENOUS | Status: DC | PRN

## 2018-08-03 MED ORDER — LATANOPROST 0.005 % OP SOLN
1.0000 [drp] | Freq: Every day | OPHTHALMIC | Status: DC
  Filled 2018-08-03: qty 2.5

## 2018-08-03 MED ORDER — BRIMONIDINE TARTRATE-TIMOLOL 0.2-0.5 % OP SOLN: 1.0000 [drp] | Freq: Two times a day (BID) | OPHTHALMIC | Status: DC

## 2018-08-03 MED ORDER — BRIMONIDINE TARTRATE 0.2 % OP SOLN
1.0000 [drp] | Freq: Two times a day (BID) | OPHTHALMIC | Status: DC
  Filled 2018-08-03 (×2): qty 5

## 2018-08-03 MED ORDER — ACETAMINOPHEN 325 MG PO TABS: 650.0000 mg | ORAL_TABLET | Freq: Four times a day (QID) | ORAL | Status: DC | PRN

## 2018-08-03 MED ORDER — ASPIRIN EC 81 MG PO TBEC
81.0000 mg | DELAYED_RELEASE_TABLET | Freq: Every day | ORAL | Status: DC
  Administered 2018-08-03 – 2018-08-06 (×4): 81 mg via ORAL
  Filled 2018-08-03 (×4): qty 1

## 2018-08-03 MED ORDER — SODIUM CHLORIDE 0.9% FLUSH: 3.0000 mL | INTRAVENOUS | Status: DC | PRN

## 2018-08-03 MED ORDER — ONDANSETRON HCL 4 MG/2ML IJ SOLN: 4.0000 mg | Freq: Four times a day (QID) | INTRAMUSCULAR | Status: DC | PRN

## 2018-08-03 MED ORDER — LORAZEPAM 0.5 MG PO TABS: 0.5000 mg | ORAL_TABLET | Freq: Four times a day (QID) | ORAL | Status: DC | PRN

## 2018-08-03 MED ORDER — FUROSEMIDE 10 MG/ML IJ SOLN
80.0000 mg | Freq: Once | INTRAMUSCULAR | Status: AC
  Administered 2018-08-03: 80 mg via INTRAVENOUS
  Filled 2018-08-03: qty 8

## 2018-08-03 MED ORDER — POLYETHYLENE GLYCOL 3350 17 G PO PACK: 17.0000 g | PACK | Freq: Every day | ORAL | Status: DC | PRN

## 2018-08-03 MED ORDER — CARVEDILOL 12.5 MG PO TABS
12.5000 mg | ORAL_TABLET | Freq: Two times a day (BID) | ORAL | Status: DC
  Administered 2018-08-03 – 2018-08-06 (×6): 12.5 mg via ORAL
  Filled 2018-08-03 (×6): qty 1

## 2018-08-03 MED ORDER — SODIUM CHLORIDE 0.9% FLUSH
3.0000 mL | Freq: Two times a day (BID) | INTRAVENOUS | Status: DC
  Administered 2018-08-03 – 2018-08-06 (×6): 3 mL via INTRAVENOUS

## 2018-08-03 MED ORDER — ONDANSETRON HCL 4 MG PO TABS: 4.0000 mg | ORAL_TABLET | Freq: Four times a day (QID) | ORAL | Status: DC | PRN

## 2018-08-03 MED ORDER — TRAZODONE HCL 50 MG PO TABS: 50.0000 mg | ORAL_TABLET | Freq: Every evening | ORAL | Status: DC | PRN

## 2018-08-03 MED ORDER — ALBUTEROL SULFATE (2.5 MG/3ML) 0.083% IN NEBU: 2.5000 mg | INHALATION_SOLUTION | RESPIRATORY_TRACT | Status: DC | PRN

## 2018-08-03 MED ORDER — TIMOLOL MALEATE 0.5 % OP SOLN
1.0000 [drp] | Freq: Two times a day (BID) | OPHTHALMIC | Status: DC
  Filled 2018-08-03 (×2): qty 5

## 2018-08-03 MED ORDER — FUROSEMIDE 10 MG/ML IJ SOLN
60.0000 mg | Freq: Two times a day (BID) | INTRAMUSCULAR | Status: DC
  Administered 2018-08-03 – 2018-08-06 (×6): 60 mg via INTRAVENOUS
  Filled 2018-08-03 (×6): qty 6

## 2018-08-03 MED ORDER — INSULIN ASPART 100 UNIT/ML ~~LOC~~ SOLN
0.0000 [IU] | Freq: Three times a day (TID) | SUBCUTANEOUS | Status: DC
  Administered 2018-08-03: 19:00:00 2 [IU] via SUBCUTANEOUS
  Administered 2018-08-04: 13:00:00 1 [IU] via SUBCUTANEOUS
  Administered 2018-08-05: 12:00:00 2 [IU] via SUBCUTANEOUS
  Administered 2018-08-06: 12:00:00 1 [IU] via SUBCUTANEOUS

## 2018-08-03 NOTE — Patient Instructions (Signed)
Hold coumadin tonight then decrease dose to 1/2 tablet daily except 1 tablet on Mondays and Thursdays  Recheck in 4 weeks

## 2018-08-03 NOTE — Progress Notes (Signed)
Alert and oriented. Stated gets short of breath when moves around her house.  93% on room air.  States swelling to lower legs is new for her.  Ted hose and scds applied

## 2018-08-03 NOTE — ED Triage Notes (Signed)
Family reports pt has had SOB with exertion for 2 weeks. +3 edema in bilateral extremities. Denies cough or pain

## 2018-08-03 NOTE — ED Triage Notes (Signed)
Pt is HOH and has family with her

## 2018-08-03 NOTE — Progress Notes (Addendum)
ANTICOAGULATION CONSULT NOTE - Initial Consult  Pharmacy Consult for Coumadin Indication: atrial fibrillation  No Known Allergies  Patient Measurements: 5'5" Vital Signs: Temp: 98.8 F (37.1 C) (07/08 1030) Temp Source: Oral (07/08 1030) BP: 129/84 (07/08 1130) Pulse Rate: 79 (07/08 1145)  Labs: Recent Labs    08/03/18 1014 08/03/18 1046 08/03/18 1239  HGB  --  9.3*  --   HCT  --  31.5*  --   PLT  --  174  --   INR 3.3*  --   --   CREATININE  --  2.20*  --   TROPONINIHS  --  21.00* 20.00*    CrCl cannot be calculated (Unknown ideal weight.).   Medical History: Past Medical History:  Diagnosis Date  . Anxiety   . Atrial fibrillation ([place])   . CHF (congestive heart failure) ([place])   . Diabetes mellitus without complication ([place])   . Hypertension     Medications:  See med rec Assessment: Patient presented to ED with SOB and edema in extremities. She is chronically anticoagulated with coumadin for afib. Pharmacy asked to dose.  INR is elevated at 3.3 at [place] dose adjusted at clinic to 5mg  on Monday and Thursday.  2.5mg  all other days  Goal of Therapy:  INR 2-3 Monitor platelets by anticoagulation protocol: Yes   Plan:  No coumadin today Daily PT-INR Monitor for s/s of bleeding  Isac Sarna, BS [place], BCPS Clinical Pharmacist Pager [phone] 08/03/2018,3:42 PM

## 2018-08-03 NOTE — H&P (Signed)
Patient Demographics:    Barbara Stone, is a 83 y.o. female  MRN: [id number]   DOB - 06/05/1935  Admit Date - 08/03/2018  Outpatient Primary MD for the patient is Lemmie Evens, MD   Assessment & Plan:    Principal Problem:   Acute on chronic HFrEF (heart failure with reduced ejection fraction)/EF 25 % Active Problems:   Chronic kidney disease (CKD), stage IV (severe) (HCC)   Type 2 diabetes mellitus with stage 4 chronic kidney disease (HCC)   Atrial fibrillation (HCC) [I48.91]   HTN (hypertension)   Acute exacerbation of CHF (congestive heart failure) (HCC)   Anticoagulant long-term use/Coumadin for Afib    1)HFrEF----acute on chronic systolic CHF exacerbation, last known EF 25%, patient presenting with dyspnea at rest, dyspnea on exertion, orthopnea, weight gain and increasing lower extremity edema----patient on telemetry monitored unit, serial troponins borderline but flat, not consistent with ACS, IV Lasix as ordered, daily weight and daily fluid input and output monitoring, low-salt diet  2) chronic Afib--- INR is 3.3 pharmacy consult for Coumadin management, continue Coreg 12.5 mg p.o. twice daily for rate control  3)DM2-recent A1c 6.3 reflecting excellent diabetic control, continue glimepiride 1 mg with breakfast, may use sliding scale insulin coverage  4)CKD IV--renal function appears to be close to patient's baseline with creatinine usually around 2, watch renal function closely with IV diuresis avoid nephrotoxic agents  5) iron deficiency anemia--- suspect some component of anemia of CKD as well, continue iron replacement, hemoglobin is 9.3 which is close to patient's baseline,... No evidence of ongoing bleeding    With History of - Reviewed by me  Past Medical History:  Diagnosis Date  . Anxiety    . Atrial fibrillation ([place])   . CHF (congestive heart failure) ([place])   . Diabetes mellitus without complication ([place])   . Hypertension       Past Surgical History:  Procedure Laterality Date  . BACK SURGERY    . EYE SURGERY Left       Chief Complaint  Patient presents with  . Shortness of Breath      HPI:    Barbara Stone  is a 83 y.o. female  with medical history significant forchronic systolic CHF (EF 45% by TTE 06/2017), atrial fibrillation on Coumadin, type 2 diabetes, hypertension, and CKD stage IV presents to ED with worsening dyspnea, dyspnea on exertion, weight gain and lower extremity edema --- Symptoms have been worsening over the last couple of weeks but became particularly worse over the last 24 hours  No frank chest pains,  History is difficult as patient is very very hard of hearing  --Apparently no dizziness, no syncope no pleuritic symptoms.. She has been compliant with her medications including Coumadin INR is above 3  --No fevers, no productive cough  In ED.Marland Kitchen COVID-19 is negative, INR is 3.3, BNP is 933, troponin borderline and flat in the patient with CKD IV-- -- In ED... Chest x-ray  and clinical exam consistent with CHF exacerbation   Review of systems:    In addition to the HPI above,   A full Review of  Systems was done, all other systems reviewed are negative except as noted above in HPI , .    Social History:  Reviewed by me    Social History   Tobacco Use  . Smoking status: Never Smoker  . Smokeless tobacco: Never Used  Substance Use Topics  . Alcohol use: No     Family History :  Reviewed by me    Family History  Problem Relation Age of Onset  . Colon cancer Neg Hx      Home Medications:   Prior to Admission medications   Medication Sig Start Date End Date Taking? Authorizing Provider  aspirin EC 81 MG tablet Take 81 mg by mouth daily.   Yes [provider]  carvedilol (COREG) 12.5 MG tablet Take 1 tablet (12.5  mg total) by mouth 2 (two) times daily. 03/22/16  Yes Isaac Bliss, Rayford Halsted, MD  COMBIGAN 0.2-0.5 % ophthalmic solution Place 1 drop into both eyes 2 (two) times daily. 06/22/17  Yes [provider]  ergocalciferol (VITAMIN D2) 1.25 MG (50000 UT) capsule Take 1 tablet by mouth daily.   Yes [provider]  FEROSUL 325 (65 Fe) MG tablet Take 1 tablet by mouth 2 (two) times daily. 06/26/17  Yes [provider]  furosemide (LASIX) 40 MG tablet Take 40 mg daily EXCEPT on Saturday and Sunday, take 80 MG ( 2 pills)  May take an extra 40 mg if you gain more than 3 lbs overnight 05/12/17  Yes Herminio Commons, MD  glimepiride (AMARYL) 1 MG tablet Take 1 mg by mouth daily with breakfast.   Yes [provider]  LORazepam (ATIVAN) 0.5 MG tablet Take 1 tablet by mouth as needed. 06/22/17  Yes [provider]  potassium chloride SA (K-DUR,KLOR-CON) 20 MEQ tablet Take 20 meq daily EXCEPT on Saturday and Sunday, take 40 meq (2 pills) Patient taking differently: Take 20 mEq by mouth daily. Take 20 meq daily EXCEPT on Saturday and Sunday, take 40 meq (2 pills) 05/12/17  Yes Herminio Commons, MD  traMADol (ULTRAM) 50 MG tablet Take 1 tablet (50 mg total) by mouth every 6 (six) hours as needed for severe pain. 02/06/18  Yes Johnson, Clanford L, MD  TRAVATAN Z 0.004 % SOLN ophthalmic solution Place 1 drop into both eyes at bedtime. 06/22/17  Yes [provider]  warfarin (COUMADIN) 5 MG tablet Take 1 tablet daily except 1/2 tablet on Fridays Patient taking differently: Take 5mg  on Monday and Thursday, then 2.5mg  all other days 02/07/18  Yes Johnson, Clanford L, MD  Cholecalciferol (VITAMIN D PO) Take 1 tablet by mouth daily.    [provider]  guaiFENesin-codeine 100-10 MG/5ML syrup Take 1 mL by mouth 4 (four) times daily. 06/25/17   [provider]     Allergies:    No Known Allergies   Physical Exam:   Vitals  Blood pressure (!)  137/96, pulse 78, temperature 98.4 F (36.9 C), temperature source Oral, resp. rate 18, height 5\' 5"  (1.651 m), weight 95.9 kg, SpO2 93 %.  Physical Examination: General appearance - alert, able to speak in sentences at rest  Ears--- very very hard of hearing mental status - alert, oriented to person, place, and time,  Eyes - sclera anicteric Neck - supple, no JVD elevation , Chest -diminished in bases  with bibasilar rales  heart - S1 and S2 normal, irregularly irregular  abdomen - soft, nontender, nondistended, no masses or organomegaly Neurological - screening mental status exam normal, neck supple without rigidity, cranial nerves II through XII intact, DTR's normal and symmetric Extremities -2-3+ pitting  pedal edema noted, intact peripheral pulses  Skin - warm, dry     Data Review:    CBC Recent Labs  Lab 08/03/18 1046  WBC 4.6  HGB 9.3*  HCT 31.5*  PLT 174  MCV 94.6  MCH 27.9  MCHC 29.5*  RDW 18.2*  LYMPHSABS 0.5*  MONOABS 0.4  EOSABS 0.2  BASOSABS 0.0   ------------------------------------------------------------------------------------------------------------------  Chemistries  Recent Labs  Lab 08/03/18 1046  NA 139  K 4.1  CL 104  CO2 23  GLUCOSE 190*  BUN 65*  CREATININE 2.20*  CALCIUM 8.7*  AST 16  ALT 11  ALKPHOS 110  BILITOT 1.3*   ------------------------------------------------------------------------------------------------------------------ estimated creatinine clearance is 23 mL/min (A) (by C-G formula based on SCr of 2.2 mg/dL (H)). ------------------------------------------------------------------------------------------------------------------ No results for input(s): TSH, T4TOTAL, T3FREE, THYROIDAB in the last 72 hours.  Invalid input(s): FREET3   Coagulation profile Recent Labs  Lab 08/03/18 1014  INR 3.3*   ------------------------------------------------------------------------------------------------------------------- No  results for input(s): DDIMER in the last 72 hours. -------------------------------------------------------------------------------------------------------------------  Cardiac Enzymes No results for input(s): CKMB, TROPONINI, MYOGLOBIN in the last 168 hours.  Invalid input(s): CK ------------------------------------------------------------------------------------------------------------------    Component Value Date/Time   BNP 933.0 (H) 08/03/2018 1046     ---------------------------------------------------------------------------------------------------------------  Urinalysis    Component Value Date/Time   COLORURINE YELLOW 06/27/2017 2300   APPEARANCEUR CLEAR 06/27/2017 2300   LABSPEC 1.010 06/27/2017 2300   PHURINE 5.0 06/27/2017 2300   GLUCOSEU NEGATIVE 06/27/2017 2300   HGBUR NEGATIVE 06/27/2017 2300   BILIRUBINUR NEGATIVE 06/27/2017 2300   KETONESUR NEGATIVE 06/27/2017 2300   PROTEINUR NEGATIVE 06/27/2017 2300   UROBILINOGEN 0.2 09/06/2006 1425   NITRITE NEGATIVE 06/27/2017 2300   LEUKOCYTESUR NEGATIVE 06/27/2017 2300    ----------------------------------------------------------------------------------------------------------------   Imaging Results:    Dg Chest 2 View  Result Date: 08/03/2018 CLINICAL DATA:  Dyspnea with exertion. EXAM: CHEST - 2 VIEW COMPARISON:  Radiograph June 27, 2017. FINDINGS: Stable cardiomegaly. Atherosclerosis of thoracic aorta is noted. Mild central pulmonary vascular congestion is noted. No acute pulmonary disease is noted. Both lungs are clear. The visualized skeletal structures are unremarkable. IMPRESSION: Stable cardiomegaly with central pulmonary vascular congestion. Aortic Atherosclerosis (ICD10-I70.0). Electronically Signed   By: Marijo Conception M.D.   On: 08/03/2018 11:28    Radiological Exams on Admission: Dg Chest 2 View  Result Date: 08/03/2018 CLINICAL DATA:  Dyspnea with exertion. EXAM: CHEST - 2 VIEW COMPARISON:  Radiograph  June 27, 2017. FINDINGS: Stable cardiomegaly. Atherosclerosis of thoracic aorta is noted. Mild central pulmonary vascular congestion is noted. No acute pulmonary disease is noted. Both lungs are clear. The visualized skeletal structures are unremarkable. IMPRESSION: Stable cardiomegaly with central pulmonary vascular congestion. Aortic Atherosclerosis (ICD10-I70.0). Electronically Signed   By: Marijo Conception M.D.   On: 08/03/2018 11:28   DVT Prophylaxis -SCD /coumadin AM Labs Ordered, also please review Full Orders  Family Communication: Admission, patients condition and plan of care including tests being ordered have been discussed with the patient who indicate understanding and agree with the plan   Code Status - Full Code  Likely DC to  home  Condition   stable  Roxan Hockey M.D on 08/03/2018 at 6:15 PM Go to [email or web address] -  for contact info  [facility] - Office  [phone]

## 2018-08-04 DIAGNOSIS — E1122 Type 2 diabetes mellitus with diabetic chronic kidney disease: Secondary | ICD-10-CM | POA: Diagnosis present

## 2018-08-04 DIAGNOSIS — N184 Chronic kidney disease, stage 4 (severe): Secondary | ICD-10-CM | POA: Diagnosis present

## 2018-08-04 DIAGNOSIS — Z7901 Long term (current) use of anticoagulants: Secondary | ICD-10-CM | POA: Diagnosis not present

## 2018-08-04 DIAGNOSIS — H9190 Unspecified hearing loss, unspecified ear: Secondary | ICD-10-CM | POA: Diagnosis present

## 2018-08-04 DIAGNOSIS — Z79899 Other long term (current) drug therapy: Secondary | ICD-10-CM | POA: Diagnosis not present

## 2018-08-04 DIAGNOSIS — I5023 Acute on chronic systolic (congestive) heart failure: Secondary | ICD-10-CM | POA: Diagnosis present

## 2018-08-04 DIAGNOSIS — I482 Chronic atrial fibrillation, unspecified: Secondary | ICD-10-CM | POA: Diagnosis present

## 2018-08-04 DIAGNOSIS — I130 Hypertensive heart and chronic kidney disease with heart failure and stage 1 through stage 4 chronic kidney disease, or unspecified chronic kidney disease: Secondary | ICD-10-CM | POA: Diagnosis present

## 2018-08-04 DIAGNOSIS — D509 Iron deficiency anemia, unspecified: Secondary | ICD-10-CM | POA: Diagnosis present

## 2018-08-04 DIAGNOSIS — Z7982 Long term (current) use of aspirin: Secondary | ICD-10-CM | POA: Diagnosis not present

## 2018-08-04 DIAGNOSIS — Z1159 Encounter for screening for other viral diseases: Secondary | ICD-10-CM | POA: Diagnosis not present

## 2018-08-04 DIAGNOSIS — Z7984 Long term (current) use of oral hypoglycemic drugs: Secondary | ICD-10-CM | POA: Diagnosis not present

## 2018-08-04 LAB — PROTIME-INR
INR: 2.7 — ABNORMAL HIGH (ref 0.8–1.2)
Prothrombin Time: 28.2 seconds — ABNORMAL HIGH (ref 11.4–15.2)

## 2018-08-04 LAB — GLUCOSE, CAPILLARY
Glucose-Capillary: 71 mg/dL (ref 70–99)
Glucose-Capillary: 132 mg/dL — ABNORMAL HIGH (ref 70–99)
Glucose-Capillary: 105 mg/dL — ABNORMAL HIGH (ref 70–99)
Glucose-Capillary: 124 mg/dL — ABNORMAL HIGH (ref 70–99)

## 2018-08-04 LAB — CBC
HCT: 29.9 % — ABNORMAL LOW (ref 36.0–46.0)
Hemoglobin: 8.6 g/dL — ABNORMAL LOW (ref 12.0–15.0)
MCH: 27 pg (ref 26.0–34.0)
MCHC: 28.8 g/dL — ABNORMAL LOW (ref 30.0–36.0)
MCV: 94 fL (ref 80.0–100.0)
Platelets: 184 10*3/uL (ref 150–400)
RBC: 3.18 MIL/uL — ABNORMAL LOW (ref 3.87–5.11)
RDW: 18.3 % — ABNORMAL HIGH (ref 11.5–15.5)
WBC: 5 10*3/uL (ref 4.0–10.5)
nRBC: 0 % (ref 0.0–0.2)

## 2018-08-04 LAB — BASIC METABOLIC PANEL
Anion gap: 13 (ref 5–15)
BUN: 64 mg/dL — ABNORMAL HIGH (ref 8–23)
CO2: 22 mmol/L (ref 22–32)
Calcium: 8.7 mg/dL — ABNORMAL LOW (ref 8.9–10.3)
Chloride: 106 mmol/L (ref 98–111)
Creatinine, Ser: 2.26 mg/dL — ABNORMAL HIGH (ref 0.44–1.00)
GFR calc Af Amer: 23 mL/min — ABNORMAL LOW (ref >60)
GFR calc non Af Amer: 20 mL/min — ABNORMAL LOW (ref >60)
Glucose, Bld: 71 mg/dL (ref 70–99)
Potassium: 3.9 mmol/L (ref 3.5–5.1)
Sodium: 141 mmol/L (ref 135–145)

## 2018-08-04 MED ORDER — FUROSEMIDE 10 MG/ML IJ SOLN
40.0000 mg | Freq: Once | INTRAMUSCULAR | Status: AC
  Administered 2018-08-04: 15:00:00 40 mg via INTRAVENOUS
  Filled 2018-08-04: qty 4

## 2018-08-04 MED ORDER — TRAVOPROST 0.004 % OP SOLN
1.0000 [drp] | Freq: Every day | OPHTHALMIC | Status: DC
  Administered 2018-08-04 – 2018-08-05 (×2): 1 [drp] via OPHTHALMIC
  Filled 2018-08-04: qty 5

## 2018-08-04 MED ORDER — LORAZEPAM 0.5 MG PO TABS: 0.5000 mg | ORAL_TABLET | Freq: Four times a day (QID) | ORAL | Status: DC | PRN

## 2018-08-04 MED ORDER — BRIMONIDINE TARTRATE-TIMOLOL 0.2-0.5 % OP SOLN
1.0000 [drp] | Freq: Two times a day (BID) | OPHTHALMIC | Status: DC
  Administered 2018-08-04 – 2018-08-06 (×5): 1 [drp] via OPHTHALMIC

## 2018-08-04 NOTE — Progress Notes (Signed)
ANTICOAGULATION CONSULT NOTE - Follow up [facility] for Coumadin Indication: atrial fibrillation  No Known Allergies  Patient Measurements: 5'5" Vital Signs: Temp: 98.3 F (36.8 C) (07/09 0603) BP: 131/61 (07/09 0603) Pulse Rate: 82 (07/09 0603)  Labs: Recent Labs    08/03/18 1014 08/03/18 1046 08/03/18 1239 08/04/18 0512  HGB  --  9.3*  --  8.6*  HCT  --  31.5*  --  29.9*  PLT  --  174  --  184  LABPROT  --   --   --  28.2*  INR 3.3*  --   --  2.7*  CREATININE  --  2.20*  --  2.26*  TROPONINIHS  --  21.00* 20.00*  --     Estimated Creatinine Clearance: 22.5 mL/min (A) (by C-G formula based on SCr of 2.26 mg/dL (H)).   Medical History: Past Medical History:  Diagnosis Date  . Anxiety   . Atrial fibrillation ([place])   . CHF (congestive heart failure) ([place])   . Diabetes mellitus without complication ([place])   . Hypertension     Medications:  See med rec Assessment: Patient presented to ED with SOB and edema in extremities. She is chronically anticoagulated with coumadin for afib. Pharmacy asked to dose.  INR is therapeutic this AM at 2.7. Still elevated so will give lower dose of 2.5mg  today. Home dose adjusted at clinic to 5mg  on Monday and Thursday.  2.5mg  all other days  Goal of Therapy:  INR 2-3 Monitor platelets by anticoagulation protocol: Yes   Plan:  Coumadin 2.5mg  x 1 today Daily PT-INR Monitor for s/s of bleeding  Isac Sarna, BS Vena [place], BCPS Clinical Pharmacist Pager [phone] 08/04/2018,8:08 AM

## 2018-08-04 NOTE — ED Provider Notes (Signed)
[facility] UNIT Provider Note   CSN: [id number] Arrival date & time: 08/03/18  1012     History   Chief Complaint Chief Complaint  Patient presents with   Shortness of Breath    HPI Barbara Stone is a 83 y.o. female.     Patient complained of some shortness of breath and swelling.  The history is provided by the patient. No language interpreter was used.  Shortness of Breath Severity:  Moderate Onset quality:  Sudden Timing:  Constant Chronicity:  Recurrent Context: activity   Relieved by:  Nothing Worsened by:  Nothing Associated symptoms: no abdominal pain, no chest pain, no cough, no headaches and no rash     Past Medical History:  Diagnosis Date   Anxiety    Atrial fibrillation (HCC)    CHF (congestive heart failure) ([place])    Diabetes mellitus without complication ([place])    Hypertension     Patient Active Problem List   Diagnosis Date Noted   Acute exacerbation of CHF (congestive heart failure) ([place]) 08/03/2018   Acute on chronic HFrEF (heart failure with reduced ejection fraction)/EF 25 % 08/03/2018   Type 2 diabetes mellitus with stage 4 chronic kidney disease ([place]) 08/03/2018   Anticoagulant long-term use/Coumadin for Afib 08/03/2018   Cellulitis of left hand 02/04/2018   Acute on chronic systolic (congestive) heart failure ([place]) 06/27/2017   Acute-on-chronic kidney injury ([place]) 06/27/2017   Community acquired pneumonia 06/27/2017   Diabetes mellitus without complication (HCC)    IDA (iron deficiency anemia) 12/14/2016   Acute respiratory failure with hypoxemia ([place]) 76/16/0737   Chronic systolic CHF (congestive heart failure) ([place]) 03/19/2016   HTN (hypertension) 03/19/2016   Chronic kidney disease (CKD), stage IV (severe) ([place]) 03/19/2016   Atrial fibrillation ([place]) [I48.91] 03/09/2016   Encounter for therapeutic drug monitoring 03/09/2016    Past Surgical History:  Procedure Laterality Date   BACK  SURGERY     EYE SURGERY Left      OB History   No obstetric history on file.      Home Medications    Prior to Admission medications   Medication Sig Start Date End Date Taking? Authorizing Provider  aspirin EC 81 MG tablet Take 81 mg by mouth daily.   Yes [provider]  carvedilol (COREG) 12.5 MG tablet Take 1 tablet (12.5 mg total) by mouth 2 (two) times daily. 03/22/16  Yes Isaac Bliss, Rayford Halsted, MD  COMBIGAN 0.2-0.5 % ophthalmic solution Place 1 drop into both eyes 2 (two) times daily. 06/22/17  Yes [provider]  ergocalciferol (VITAMIN D2) 1.25 MG (50000 UT) capsule Take 1 tablet by mouth daily.   Yes [provider]  FEROSUL 325 (65 Fe) MG tablet Take 1 tablet by mouth 2 (two) times daily. 06/26/17  Yes [provider]  furosemide (LASIX) 40 MG tablet Take 40 mg daily EXCEPT on Saturday and Sunday, take 80 MG ( 2 pills)  May take an extra 40 mg if you gain more than 3 lbs overnight 05/12/17  Yes Herminio Commons, MD  glimepiride (AMARYL) 1 MG tablet Take 1 mg by mouth daily with breakfast.   Yes [provider]  LORazepam (ATIVAN) 0.5 MG tablet Take 1 tablet by mouth as needed. 06/22/17  Yes [provider]  potassium chloride SA (K-DUR,KLOR-CON) 20 MEQ tablet Take 20 meq daily EXCEPT on Saturday and Sunday, take 40 meq (2 pills) Patient taking differently: Take 20 mEq by mouth daily. Take  20 meq daily EXCEPT on Saturday and Sunday, take 40 meq (2 pills) 05/12/17  Yes Herminio Commons, MD  traMADol (ULTRAM) 50 MG tablet Take 1 tablet (50 mg total) by mouth every 6 (six) hours as needed for severe pain. 02/06/18  Yes Johnson, Clanford L, MD  TRAVATAN Z 0.004 % SOLN ophthalmic solution Place 1 drop into both eyes at bedtime. 06/22/17  Yes [provider]  warfarin (COUMADIN) 5 MG tablet Take 1 tablet daily except 1/2 tablet on Fridays Patient taking differently: Take 5mg  on Monday and Thursday, then 2.5mg  all  other days 02/07/18  Yes Johnson, Clanford L, MD  Cholecalciferol (VITAMIN D PO) Take 1 tablet by mouth daily.    [provider]  guaiFENesin-codeine 100-10 MG/5ML syrup Take 1 mL by mouth 4 (four) times daily. 06/25/17   [provider]    Family History Family History  Problem Relation Age of Onset   Colon cancer Neg Hx     Social History Social History   Tobacco Use   Smoking status: Never Smoker   Smokeless tobacco: Never Used  Substance Use Topics   Alcohol use: No   Drug use: No     Allergies   Patient has no known allergies.   Review of Systems Review of Systems  Constitutional: Negative for appetite change and fatigue.  HENT: Negative for congestion, ear discharge and sinus pressure.   Eyes: Negative for discharge.  Respiratory: Positive for shortness of breath. Negative for cough.   Cardiovascular: Negative for chest pain.  Gastrointestinal: Negative for abdominal pain and diarrhea.  Genitourinary: Negative for frequency and hematuria.  Musculoskeletal: Negative for back pain.  Skin: Negative for rash.  Neurological: Negative for seizures and headaches.  Psychiatric/Behavioral: Negative for hallucinations.     Physical Exam Updated Vital Signs BP 129/64 (BP Location: Left Arm)    Pulse 76    Temp 98.2 F (36.8 C) (Oral)    Resp 18    Ht 5\' 5"  (1.651 m)    Wt 97 kg    SpO2 97%    BMI 35.59 kg/m   Physical Exam Vitals signs and nursing note reviewed.  Constitutional:      Appearance: She is well-developed.  HENT:     Head: Normocephalic.     Nose: Nose normal.  Eyes:     General: No scleral icterus.    Conjunctiva/sclera: Conjunctivae normal.  Neck:     Musculoskeletal: Neck supple.     Thyroid: No thyromegaly.  Cardiovascular:     Rate and Rhythm: Normal rate and regular rhythm.     Heart sounds: No murmur. No friction rub. No gallop.   Pulmonary:     Breath sounds: No stridor. No wheezing or rales.  Chest:     Chest  wall: No tenderness.  Abdominal:     General: There is no distension.     Tenderness: There is no abdominal tenderness. There is no rebound.  Musculoskeletal: Normal range of motion.     Comments: Significant edema to legs  Lymphadenopathy:     Cervical: No cervical adenopathy.  Skin:    Findings: No erythema or rash.  Neurological:     Mental Status: She is oriented to person, place, and time.     Motor: No abnormal muscle tone.     Coordination: Coordination normal.  Psychiatric:        Behavior: Behavior normal.      ED Treatments / Results  Labs (all labs ordered  are listed, but only abnormal results are displayed) Labs Reviewed  CBC WITH DIFFERENTIAL/PLATELET - Abnormal; Notable for the following components:      Result Value   RBC 3.33 (*)    Hemoglobin 9.3 (*)    HCT 31.5 (*)    MCHC 29.5 (*)    RDW 18.2 (*)    Lymphs Abs 0.5 (*)    All other components within normal limits  COMPREHENSIVE METABOLIC PANEL - Abnormal; Notable for the following components:   Glucose, Bld 190 (*)    BUN 65 (*)    Creatinine, Ser 2.20 (*)    Calcium 8.7 (*)    Total Bilirubin 1.3 (*)    GFR calc non Af Amer 20 (*)    GFR calc Af Amer 24 (*)    All other components within normal limits  BRAIN NATRIURETIC PEPTIDE - Abnormal; Notable for the following components:   B Natriuretic Peptide 933.0 (*)    All other components within normal limits  TROPONIN I (HIGH SENSITIVITY) - Abnormal; Notable for the following components:   Troponin I (High Sensitivity) 21.00 (*)    All other components within normal limits  TROPONIN I (HIGH SENSITIVITY) - Abnormal; Notable for the following components:   Troponin I (High Sensitivity) 20.00 (*)    All other components within normal limits  BASIC METABOLIC PANEL - Abnormal; Notable for the following components:   BUN 64 (*)    Creatinine, Ser 2.26 (*)    Calcium 8.7 (*)    GFR calc non Af Amer 20 (*)    GFR calc Af Amer 23 (*)    All other  components within normal limits  CBC - Abnormal; Notable for the following components:   RBC 3.18 (*)    Hemoglobin 8.6 (*)    HCT 29.9 (*)    MCHC 28.8 (*)    RDW 18.3 (*)    All other components within normal limits  PROTIME-INR - Abnormal; Notable for the following components:   Prothrombin Time 28.2 (*)    INR 2.7 (*)    All other components within normal limits  GLUCOSE, CAPILLARY - Abnormal; Notable for the following components:   Glucose-Capillary 159 (*)    All other components within normal limits  GLUCOSE, CAPILLARY - Abnormal; Notable for the following components:   Glucose-Capillary 121 (*)    All other components within normal limits  GLUCOSE, CAPILLARY - Abnormal; Notable for the following components:   Glucose-Capillary 132 (*)    All other components within normal limits  GLUCOSE, CAPILLARY - Abnormal; Notable for the following components:   Glucose-Capillary 105 (*)    All other components within normal limits  SARS CORONAVIRUS 2 (HOSPITAL ORDER, [facility])  GLUCOSE, CAPILLARY  PROTIME-INR  CBC  BASIC METABOLIC PANEL    EKG EKG Interpretation  Date/Time:  Wednesday August 03 2018 10:31:17 EDT Ventricular Rate:  92 PR Interval:    QRS Duration: 130 QT Interval:  393 QTC Calculation: 487 R Axis:   -59 Text Interpretation:  Atrial fibrillation Left bundle branch block Confirmed by Milton Ferguson [phone]) on 08/03/2018 10:39:49 AM   Radiology Dg Chest 2 View  Result Date: 08/03/2018 CLINICAL DATA:  Dyspnea with exertion. EXAM: CHEST - 2 VIEW COMPARISON:  Radiograph June 27, 2017. FINDINGS: Stable cardiomegaly. Atherosclerosis of thoracic aorta is noted. Mild central pulmonary vascular congestion is noted. No acute pulmonary disease is noted. Both lungs are clear. The visualized skeletal structures are unremarkable.  IMPRESSION: Stable cardiomegaly with central pulmonary vascular congestion. Aortic Atherosclerosis (ICD10-I70.0).  Electronically Signed   By: Marijo Conception M.D.   On: 08/03/2018 11:28    Procedures Procedures (including critical care time)  Medications Ordered in ED Medications  aspirin EC tablet 81 mg (81 mg Oral Given 08/04/18 0857)  carvedilol (COREG) tablet 12.5 mg (12.5 mg Oral Given 08/04/18 0857)  glimepiride (AMARYL) tablet 1 mg (1 mg Oral Given 08/04/18 0857)  potassium chloride SA (K-DUR) CR tablet 40 mEq (40 mEq Oral Given 08/04/18 0856)  ferrous sulfate tablet 325 mg (325 mg Oral Not Given 08/04/18 0858)  traMADol (ULTRAM) tablet 50 mg (has no administration in time range)  furosemide (LASIX) injection 60 mg (60 mg Intravenous Given 08/04/18 1741)  sodium chloride flush (NS) 0.9 % injection 3 mL (3 mLs Intravenous Given 08/04/18 0859)  sodium chloride flush (NS) 0.9 % injection 3 mL (has no administration in time range)  0.9 %  sodium chloride infusion (has no administration in time range)  acetaminophen (TYLENOL) tablet 650 mg (has no administration in time range)    Or  acetaminophen (TYLENOL) suppository 650 mg (has no administration in time range)  traZODone (DESYREL) tablet 50 mg (has no administration in time range)  polyethylene glycol (MIRALAX / GLYCOLAX) packet 17 g (has no administration in time range)  ondansetron (ZOFRAN) tablet 4 mg (has no administration in time range)    Or  ondansetron (ZOFRAN) injection 4 mg (has no administration in time range)  albuterol (PROVENTIL) (2.5 MG/3ML) 0.083% nebulizer solution 2.5 mg (has no administration in time range)  insulin aspart (novoLOG) injection 0-5 Units (0 Units Subcutaneous Not Given 08/03/18 2140)  insulin aspart (novoLOG) injection 0-9 Units (0 Units Subcutaneous Not Given 08/04/18 1738)  LORazepam (ATIVAN) tablet 0.5 mg (has no administration in time range)  brimonidine-timolol (COMBIGAN) 0.2-0.5 % ophthalmic solution 1 drop (1 drop Both Eyes Given 08/04/18 1744)  travoprost (benzalkonium) (TRAVATAN) 0.004 % ophthalmic solution 1 drop (has  no administration in time range)  furosemide (LASIX) injection 80 mg (80 mg Intravenous Given 08/03/18 1133)  furosemide (LASIX) injection 40 mg (40 mg Intravenous Given 08/04/18 1435)     Initial Impression / Assessment and Plan / ED Course  I have reviewed the triage vital signs and the nursing notes.  Pertinent labs & imaging results that were available during my care of the patient were reviewed by me and considered in my medical decision making (see chart for details).        CRITICAL CARE Performed by: Milton Ferguson Total critical care time: 35 minutes Critical care time was exclusive of separately billable procedures and treating other patients. Critical care was necessary to treat or prevent imminent or life-threatening deterioration. Critical care was time spent personally by me on the following activities: development of treatment plan with patient and/or surrogate as well as nursing, discussions with consultants, evaluation of patient's response to treatment, examination of patient, obtaining history from patient or surrogate, ordering and performing treatments and interventions, ordering and review of laboratory studies, ordering and review of radiographic studies, pulse oximetry and re-evaluation of patient's condition. Patient with congestive heart failure should be admitted for diuresis  Final Clinical Impressions(s) / ED Diagnoses   Final diagnoses:  SOB (shortness of breath)    ED Discharge Orders    None       Milton Ferguson, MD 08/04/18 1940

## 2018-08-04 NOTE — Progress Notes (Addendum)
Patient Demographics:    Barbara Stone, is a 83 y.o. female, DOB - 06-Dec-1935, YHC:[id number]  Admit date - 08/03/2018   Admitting Physician Laree Garron Denton Brick, MD  Outpatient Primary MD for the patient is Lemmie Evens, MD  LOS - 0   Chief Complaint  Patient presents with  . Shortness of Breath        Subjective:    [name] today has no fevers, no emesis,  No chest pain,  shob is slightly improving, she is voiding okay..... Continues to have significant dyspnea on exertion and orthopnea  Assessment  & Plan :    Principal Problem:   Acute on chronic HFrEF (heart failure with reduced ejection fraction)/EF 25 % Active Problems:   Chronic kidney disease (CKD), stage IV (severe) (HCC)   Type 2 diabetes mellitus with stage 4 chronic kidney disease (HCC)   Atrial fibrillation (HCC) [I48.91]   HTN (hypertension)   Acute exacerbation of CHF (congestive heart failure) (HCC)   Anticoagulant long-term use/Coumadin for Afib   1)HFrEF--- baseline wt is usually around 93 kg (204 to 205lb), admission wt 211 lb, wt is up to  214 lb, patient with acute on chronic systolic dysfunction CHF, last known EF 25%,--troponins are flat, continue IV Lasix, daily weights, fluid input and output charting, low-salt diet --Continues to have significant dyspnea on exertion and orthopnea   2)CKD IV--- renal function appears to be close to baseline, continue to watch renal function closely with IV diuresis  3) acute on chronic anemia--- hemoglobin is down to 8.6 from a baseline usually between 9 and 10, no evidence of ongoing bleeding, patient with history of iron deficiency anemia superimposed on anemia of CKD  4) chronic atrial fibrillation--- stable, continue Coreg for rate control, Coumadin per pharmacy INR is currently therapeutic  5)DM2--excellent control, continue sliding scale, continue glimepiride with breakfast, A1c  6.3  Disposition/Need for in-Hospital Stay- patient unable to be discharged at this time due to CHF exacer... Requiring further IV diuresis.... she  is  way above her dry weight-Continues to have significant dyspnea on exertion and orthopnea   Code Status : Full code  Family Communication:   (patient is alert, awake and coherent)--Discussed with daughter [name]     Disposition Plan  : Home in 1 to 2 days if continues to improve  Consults  :  NA  DVT Prophylaxis  : Coumadin/TEDs  Lab Results  Component Value Date   PLT 184 08/04/2018    Inpatient Medications  Scheduled Meds: . aspirin EC  81 mg Oral Daily  . brimonidine  1 drop Both Eyes BID   And  . timolol  1 drop Both Eyes BID  . carvedilol  12.5 mg Oral BID  . ferrous sulfate  325 mg Oral BID  . furosemide  60 mg Intravenous Q12H  . glimepiride  1 mg Oral Q breakfast  . insulin aspart  0-5 Units Subcutaneous QHS  . insulin aspart  0-9 Units Subcutaneous TID WC  . latanoprost  1 drop Both Eyes QHS  . potassium chloride SA  40 mEq Oral Daily  . sodium chloride flush  3 mL Intravenous Q12H   Continuous Infusions: . sodium chloride     PRN Meds:.sodium chloride, acetaminophen **OR**  acetaminophen, albuterol, LORazepam, ondansetron **OR** ondansetron (ZOFRAN) IV, polyethylene glycol, sodium chloride flush, traMADol, traZODone    Anti-infectives (From admission, onward)   None        Objective:   Vitals:   08/03/18 2110 08/04/18 0500 08/04/18 0603 08/04/18 0824  BP: 127/89  131/61   Pulse: 82  82   Resp: 17  17   Temp: 98.1 F (36.7 C)  98.3 F (36.8 C)   TempSrc:      SpO2: 100%  100% 99%  Weight:  97 kg    Height:        Wt Readings from Last 3 Encounters:  08/04/18 97 kg  02/06/18 93.3 kg  02/01/18 93 kg     Intake/Output Summary (Last 24 hours) at 08/04/2018 1252 Last data filed at 08/04/2018 0900 Gross per 24 hour  Intake 240 ml  Output 1900 ml  Net -1660 ml    Physical Exam  Gen:-  Awake Alert, no acute distress HEENT:- [place].AT, No sclera icterus Ear- HOH Neck-Supple Neck,No JVD,.  Lungs-diminished in bases with faint bibasilar rales  CV- S1, S2 normal, irregularly irregular  abd-  +ve B.Sounds, Abd Soft, No tenderness,    Extremity/Skin:-2+ pitting edema, pedal pulses present  Psych-affect is appropriate, oriented x3 Neuro-no new focal deficits, no tremors   Data Review:   Micro Results Recent Results (from the past 240 hour(s))  SARS Coronavirus 2 (CEPHEID- Performed in [place] hospital lab), Hosp Order     Status: None   Collection Time: 08/03/18 11:34 AM   Specimen: Nasopharyngeal Swab  Result Value Ref Range Status   SARS Coronavirus 2 NEGATIVE NEGATIVE Final    Comment: (NOTE) If result is NEGATIVE SARS-CoV-2 target nucleic acids are NOT DETECTED. The SARS-CoV-2 RNA is generally detectable in upper and lower  respiratory specimens during the acute phase of infection. The lowest  concentration of SARS-CoV-2 viral copies this assay can detect is 250  copies / mL. A negative result does not preclude SARS-CoV-2 infection  and should not be used as the sole basis for treatment or other  patient management decisions.  A negative result may occur with  improper specimen collection / handling, submission of specimen other  than nasopharyngeal swab, presence of viral mutation(s) within the  areas targeted by this assay, and inadequate number of viral copies  (<250 copies / mL). A negative result must be combined with clinical  observations, patient history, and epidemiological information. If result is POSITIVE SARS-CoV-2 target nucleic acids are DETECTED. The SARS-CoV-2 RNA is generally detectable in upper and lower  respiratory specimens dur ing the acute phase of infection.  Positive  results are indicative of active infection with SARS-CoV-2.  Clinical  correlation with patient history and other diagnostic information is  necessary to determine  patient infection status.  Positive results do  not rule out bacterial infection or co-infection with other viruses. If result is PRESUMPTIVE POSTIVE SARS-CoV-2 nucleic acids MAY BE PRESENT.   A presumptive positive result was obtained on the submitted specimen  and confirmed on repeat testing.  While 2019 novel coronavirus  (SARS-CoV-2) nucleic acids may be present in the submitted sample  additional confirmatory testing may be necessary for epidemiological  and / or clinical management purposes  to differentiate between  SARS-CoV-2 and other Sarbecovirus currently known to infect humans.  If clinically indicated additional testing with an alternate test  methodology [phone]) is advised. The SARS-CoV-2 RNA is generally  detectable in upper and lower respiratory  sp ecimens during the acute  phase of infection. The expected result is Negative. Fact Sheet for Patients:  StrictlyIdeas.no Fact Sheet for Healthcare Providers: BankingDealers.co.za This test is not yet approved or cleared by the [place] FDA and has been authorized for detection and/or diagnosis of SARS-CoV-2 by FDA under an Emergency Use Authorization (EUA).  This EUA will remain in effect (meaning this test can be used) for the duration of the COVID-19 declaration under Section 564(b)(1) of the Act, 21 U.S.C. section 360bbb-3(b)(1), unless the authorization is terminated or revoked sooner. Performed at [facility], [address], [place], [place] [address]     Radiology Reports Dg Chest 2 View  Result Date: 08/03/2018 CLINICAL DATA:  Dyspnea with exertion. EXAM: CHEST - 2 VIEW COMPARISON:  Radiograph June 27, 2017. FINDINGS: Stable cardiomegaly. Atherosclerosis of thoracic aorta is noted. Mild central pulmonary vascular congestion is noted. No acute pulmonary disease is noted. Both lungs are clear. The visualized skeletal structures are unremarkable. IMPRESSION: Stable  cardiomegaly with central pulmonary vascular congestion. Aortic Atherosclerosis (ICD10-I70.0). Electronically Signed   By: Marijo Conception M.D.   On: 08/03/2018 11:28     CBC Recent Labs  Lab 08/03/18 1046 08/04/18 0512  WBC 4.6 5.0  HGB 9.3* 8.6*  HCT 31.5* 29.9*  PLT 174 184  MCV 94.6 94.0  MCH 27.9 27.0  MCHC 29.5* 28.8*  RDW 18.2* 18.3*  LYMPHSABS 0.5*  --   MONOABS 0.4  --   EOSABS 0.2  --   BASOSABS 0.0  --     Chemistries  Recent Labs  Lab 08/03/18 1046 08/04/18 0512  NA 139 141  K 4.1 3.9  CL 104 106  CO2 23 22  GLUCOSE 190* 71  BUN 65* 64*  CREATININE 2.20* 2.26*  CALCIUM 8.7* 8.7*  AST 16  --   ALT 11  --   ALKPHOS 110  --   BILITOT 1.3*  --    ------------------------------------------------------------------------------------------------------------------ No results for input(s): CHOL, HDL, LDLCALC, TRIG, CHOLHDL, LDLDIRECT in the last 72 hours.  Lab Results  Component Value Date   HGBA1C 6.6 (H) 02/14/2018   ------------------------------------------------------------------------------------------------------------------ No results for input(s): TSH, T4TOTAL, T3FREE, THYROIDAB in the last 72 hours.  Invalid input(s): FREET3 ------------------------------------------------------------------------------------------------------------------ No results for input(s): VITAMINB12, FOLATE, FERRITIN, TIBC, IRON, RETICCTPCT in the last 72 hours.  Coagulation profile Recent Labs  Lab 08/03/18 1014 08/04/18 0512  INR 3.3* 2.7*    No results for input(s): DDIMER in the last 72 hours.  Cardiac Enzymes No results for input(s): CKMB, TROPONINI, MYOGLOBIN in the last 168 hours.  Invalid input(s): CK ------------------------------------------------------------------------------------------------------------------    Component Value Date/Time   BNP 933.0 (H) 08/03/2018 1046     Roxan Hockey M.D on 08/04/2018 at 12:52 PM  Go to [email or web address] - for  contact info  [facility] - Office  [phone]

## 2018-08-04 NOTE — TOC Initial Note (Addendum)
Transition of Care [facility]) - Initial/Assessment Note    Patient Details  Name: Barbara Stone MRN: [id number] Date of Birth: [birth year]  Transition of Care [facility]) CM/SW Contact:    Jaylise Peek, [name], RN Phone Number: 08/04/2018, 1:38 PM  Clinical Narrative:     High risk readmission score due to 38 Rx's and 2 ED visits. From home, son in law lives with her. She is independent. Has cane if needed. Has PCP. Has license, family drives to appointments. Has weighing scales, admits to not weighing daily. Admits to some indiscretion in diet. On room air. Declines home health services.               Expected Discharge Plan: Home/Self Care     Patient Goals and CMS Choice Patient states their goals for this hospitalization and ongoing recovery are:: legs to "go down"   Choice offered to / list presented to : Patient  Expected Discharge Plan and Services Expected Discharge Plan: Home/Self Care     Prior Living Arrangements/Services   Lives with:: Other (Comment)(son in law) Patient language and need for interpreter reviewed:: Yes        Need for Family Participation in Patient Care: Yes (Comment) Care giver support system in place?: Yes (comment) Current home services: DME(cane prn) Criminal Activity/Legal Involvement Pertinent to Current Situation/Hospitalization: No - Comment as needed  Activities of Daily Living Home Assistive Devices/Equipment: Cane (specify quad or straight) ADL Screening (condition at time of admission) Patient's cognitive ability adequate to safely complete daily activities?: Yes Is the patient deaf or have difficulty hearing?: Yes Does the patient have difficulty seeing, even when wearing glasses/contacts?: No Does the patient have difficulty concentrating, remembering, or making decisions?: Yes Patient able to express need for assistance with ADLs?: Yes Does the patient have difficulty dressing or bathing?: No Independently performs ADLs?: Yes  (appropriate for developmental age) Does the patient have difficulty walking or climbing stairs?: No Weakness of Legs: Both Weakness of Arms/Hands: None  Permission Sought/Granted                  Emotional Assessment Appearance:: Appears stated age Attitude/Demeanor/Rapport: Self-Confident, Engaged Affect (typically observed): Accepting Orientation: : Oriented to Self, Oriented to Place, Oriented to  Time, Oriented to Situation Alcohol / Substance Use: Not Applicable Psych Involvement: No (comment)  Admission diagnosis:  Shortness of Breath Patient Active Problem List   Diagnosis Date Noted  . Acute exacerbation of CHF (congestive heart failure) ([place]) 08/03/2018  . Acute on chronic HFrEF (heart failure with reduced ejection fraction)/EF 25 % 08/03/2018  . Type 2 diabetes mellitus with stage 4 chronic kidney disease ([place]) 08/03/2018  . Anticoagulant long-term use/Coumadin for Afib 08/03/2018  . Cellulitis of left hand 02/04/2018  . Acute on chronic systolic (congestive) heart failure ([place]) 06/27/2017  . Acute-on-chronic kidney injury ([place]) 06/27/2017  . Community acquired pneumonia 06/27/2017  . Diabetes mellitus without complication ([place])   . IDA (iron deficiency anemia) 12/14/2016  . Acute respiratory failure with hypoxemia ([place]) 03/19/2016  . Chronic systolic CHF (congestive heart failure) ([place]) 03/19/2016  . HTN (hypertension) 03/19/2016  . Chronic kidney disease (CKD), stage IV (severe) ([place]) 03/19/2016  . Atrial fibrillation ([place]) [I48.91] 03/09/2016  . Encounter for therapeutic drug monitoring 03/09/2016   PCP:  Lemmie Evens, MD Pharmacy:   [facility], [place] [address] [place] [address] Phone: [phone] Fax: [phone]  [facility] [address], [place] - [phone] [place] #  [id number] [place] [id number] Phone: [phone] Fax: [phone]     Social Determinants of Health  (SDOH) Interventions    Readmission Risk Interventions Readmission Risk Prevention Plan 08/04/2018  Transportation Screening Complete  HRI or Home Care Consult Complete  Social Work Consult for [place] Planning/Counseling Complete  Palliative Care Screening Not Applicable  Medication Review Press photographer) Complete  Some recent data might be hidden

## 2018-08-04 NOTE — Care Management Obs Status (Signed)
[facility] NOTIFICATION   Patient Details  Name: Barbara Stone MRN: [id number] Date of Birth: 03/21/1935   Medicare Observation Status Notification Given:  Yes    Boneta Lucks, RN 08/04/2018, 10:04 AM

## 2018-08-05 LAB — CBC
HCT: 32.2 % — ABNORMAL LOW (ref 36.0–46.0)
Hemoglobin: 9.5 g/dL — ABNORMAL LOW (ref 12.0–15.0)
MCH: 27.4 pg (ref 26.0–34.0)
MCHC: 29.5 g/dL — ABNORMAL LOW (ref 30.0–36.0)
MCV: 92.8 fL (ref 80.0–100.0)
Platelets: 177 10*3/uL (ref 150–400)
RBC: 3.47 MIL/uL — ABNORMAL LOW (ref 3.87–5.11)
RDW: 18.4 % — ABNORMAL HIGH (ref 11.5–15.5)
WBC: 5.3 10*3/uL (ref 4.0–10.5)
nRBC: 0 % (ref 0.0–0.2)

## 2018-08-05 LAB — BASIC METABOLIC PANEL
Anion gap: 12 (ref 5–15)
BUN: 62 mg/dL — ABNORMAL HIGH (ref 8–23)
CO2: 24 mmol/L (ref 22–32)
Calcium: 8.7 mg/dL — ABNORMAL LOW (ref 8.9–10.3)
Chloride: 106 mmol/L (ref 98–111)
Creatinine, Ser: 2.08 mg/dL — ABNORMAL HIGH (ref 0.44–1.00)
GFR calc Af Amer: 25 mL/min — ABNORMAL LOW (ref >60)
GFR calc non Af Amer: 22 mL/min — ABNORMAL LOW (ref >60)
Glucose, Bld: 107 mg/dL — ABNORMAL HIGH (ref 70–99)
Potassium: 3.5 mmol/L (ref 3.5–5.1)
Sodium: 142 mmol/L (ref 135–145)

## 2018-08-05 LAB — GLUCOSE, CAPILLARY
Glucose-Capillary: 99 mg/dL (ref 70–99)
Glucose-Capillary: 159 mg/dL — ABNORMAL HIGH (ref 70–99)
Glucose-Capillary: 111 mg/dL — ABNORMAL HIGH (ref 70–99)
Glucose-Capillary: 151 mg/dL — ABNORMAL HIGH (ref 70–99)

## 2018-08-05 LAB — PROTIME-INR
INR: 2.5 — ABNORMAL HIGH (ref 0.8–1.2)
Prothrombin Time: 26.7 seconds — ABNORMAL HIGH (ref 11.4–15.2)

## 2018-08-05 MED ORDER — WARFARIN - PHARMACIST DOSING INPATIENT
Freq: Every day | Status: DC
  Administered 2018-08-05: 17:00:00

## 2018-08-05 MED ORDER — WARFARIN SODIUM 2.5 MG PO TABS
2.5000 mg | ORAL_TABLET | Freq: Once | ORAL | Status: AC
  Administered 2018-08-05: 17:00:00 2.5 mg via ORAL
  Filled 2018-08-05: qty 1

## 2018-08-05 NOTE — Progress Notes (Signed)
ANTICOAGULATION CONSULT NOTE - Follow up [place] for Coumadin Indication: atrial fibrillation  No Known Allergies  Patient Measurements: 5'5" Vital Signs: Temp: 98.4 F (36.9 C) (07/10 0511) BP: 128/80 (07/10 0511) Pulse Rate: 87 (07/10 0511)  Labs: Recent Labs    08/03/18 1014  08/03/18 1046 08/03/18 1239 08/04/18 0512 08/05/18 0441  HGB  --    < > 9.3*  --  8.6* 9.5*  HCT  --   --  31.5*  --  29.9* 32.2*  PLT  --   --  174  --  184 177  LABPROT  --   --   --   --  28.2* 26.7*  INR 3.3*  --   --   --  2.7* 2.5*  CREATININE  --   --  2.20*  --  2.26* 2.08*  TROPONINIHS  --   --  21.00* 20.00*  --   --    < > = values in this interval not displayed.    Estimated Creatinine Clearance: 24.5 mL/min (A) (by C-G formula based on SCr of 2.08 mg/dL (H)).   Medical History: Past Medical History:  Diagnosis Date  . Anxiety   . Atrial fibrillation ([place])   . CHF (congestive heart failure) ([place])   . Diabetes mellitus without complication ([place])   . Hypertension     Medications:  See med rec Assessment: Patient presented to ED with SOB and edema in extremities. She is chronically anticoagulated with coumadin for afib. Pharmacy asked to dose.  INR is therapeutic this AM at 2.5.  Home dose adjusted at clinic to 5mg  on Monday and Thursday.  2.5mg  all other days  Goal of Therapy:  INR 2-3 Monitor platelets by anticoagulation protocol: Yes   Plan:  Coumadin 2.5mg  x 1 today Daily PT-INR Monitor for s/s of bleeding  Isac Sarna, BS Vena Austria, BCPS Clinical Pharmacist Pager [phone] 08/05/2018,7:48 AM

## 2018-08-05 NOTE — Care Management Important Message (Signed)
Important Message  Patient Details  Name: Barbara Stone MRN: [id number] Date of Birth: 26-Aug-1935   Medicare Important Message Given:  Yes     Tommy Medal 08/05/2018, 12:56 PM

## 2018-08-05 NOTE — TOC Progression Note (Signed)
Transition of Care [facility]) - Progression Note    Patient Details  Name: Barbara Stone MRN: [id number] Date of Birth: 20-Oct-1935  Transition of Care [facility]) CM/SW Contact  Shade Flood, LCSW Phone Number: 08/05/2018, 10:27 AM  Clinical Narrative:     TOC following. MD anticipating dc tomorrow. Scheduled pt's hospital follow up appointment with her PCP. If pt changes her mind about [facility] referral, weekend TOC can arrange. Otherwise, pt will return home with self care and family support.  Expected Discharge Plan: Home/Self Care    Expected Discharge Plan and Services Expected Discharge Plan: Home/Self Care                                               Social Determinants of Health (SDOH) Interventions    Readmission Risk Interventions Readmission Risk Prevention Plan 08/05/2018 08/04/2018  Transportation Screening - Complete  PCP or Specialist Appt within 3-5 Days Complete -  HRI or Home Care Consult Patient refused Complete  Social Work Consult for [place] Planning/Counseling - Complete  Palliative Care Screening - Not Applicable  Medication Review Press photographer) - Complete  Some recent data might be hidden

## 2018-08-05 NOTE — Plan of Care (Signed)

## 2018-08-05 NOTE — Progress Notes (Signed)
Patient Demographics:    Barbara Stone, is a 83 y.o. female, DOB - Dec 11, 1935, XBM:[id number]  Admit date - 08/03/2018   Admitting Physician Annaliyah Willig Denton Brick, MD  Outpatient Primary MD for the patient is Lemmie Evens, MD  LOS - 1   Chief Complaint  Patient presents with  . Shortness of Breath        Subjective:    Barbara Stone today has no fevers, no emesis,  No chest pain,   dyspnea at rest improving slowly, orthopnea improving, some dyspnea on exertion persist....   Assessment  & Plan :    Principal Problem:   Acute on chronic HFrEF (heart failure with reduced ejection fraction)/EF 25 % Active Problems:   Chronic kidney disease (CKD), stage IV (severe) (HCC)   Type 2 diabetes mellitus with stage 4 chronic kidney disease (HCC)   Atrial fibrillation (HCC) [I48.91]   HTN (hypertension)   Acute exacerbation of CHF (congestive heart failure) (HCC)   Anticoagulant long-term use/Coumadin for Afib  Brief summary 83 y.o. female  with medical history significant forchronic systolic CHF (EF 02% by TTE 06/2017), atrial fibrillation on Coumadin, type 2 diabetes, hypertension, and CKD stage IV admitted on 08/03/2018 with worsening dyspnea, worsening lower extremity edema weight gain and found to have acute on chronic systolic dysfunction CHF exacerbation  A/p 1)HFrEF--- symptomatically improving slowly, fluid balance negative, however weight remains stubbornly high compared to baseline, baseline wt is usually around 93 kg (204 to 205lb), admission wt 211 lb, wt is up to  214 lb, patient with acute on chronic systolic dysfunction CHF, last known EF 25%,--troponins are flat, continue IV Lasix, daily weights, fluid input and output charting, low-salt diet --dyspnea at rest improving slowly, orthopnea improving, some dyspnea on exertion persist. --- Will repeat daily weights with a standing scale, just in case the bed  scale is off  2)CKD IV--- renal function appears to be close to baseline, continue to watch renal function closely with IV diuresis, creatinine is currently 2.0  3) acute on chronic anemia--- hemoglobin is stable at 9.5,  baseline usually between 9 and 10, no evidence of ongoing bleeding, patient with history of iron deficiency anemia superimposed on anemia of CKD  4) chronic atrial fibrillation--- stable, continue Coreg for rate control, Coumadin per pharmacy INR is currently therapeutic at 2.5  5)DM2--excellent control, continue sliding scale, continue glimepiride with breakfast, A1c 6.3  Disposition/Need for in-Hospital Stay- patient unable to be discharged at this time due to CHF exacer... Requiring further IV diuresis.... she  is  way above her dry weight-dyspnea at rest improving slowly, orthopnea improving, some dyspnea on exertion persist.  Code Status : Full code  Family Communication:   (patient is alert, awake and coherent)--Discussed with daughter Barbara Stone     Disposition Plan  : Home in 1 to 2 days if continues to improve  Consults  :  NA  DVT Prophylaxis  : Coumadin/TEDs  Lab Results  Component Value Date   PLT 177 08/05/2018    Inpatient Medications  Scheduled Meds: . aspirin EC  81 mg Oral Daily  . brimonidine-timolol  1 drop Both Eyes BID  . carvedilol  12.5 mg Oral BID  . ferrous sulfate  325 mg Oral BID  .  furosemide  60 mg Intravenous Q12H  . glimepiride  1 mg Oral Q breakfast  . insulin aspart  0-5 Units Subcutaneous QHS  . insulin aspart  0-9 Units Subcutaneous TID WC  . potassium chloride SA  40 mEq Oral Daily  . sodium chloride flush  3 mL Intravenous Q12H  . travoprost (benzalkonium)  1 drop Both Eyes QHS  . warfarin  2.5 mg Oral Once  . Warfarin - Pharmacist Dosing Inpatient   Does not apply q1800   Continuous Infusions: . sodium chloride     PRN Meds:.sodium chloride, acetaminophen **OR** acetaminophen, albuterol, LORazepam, ondansetron **OR**  ondansetron (ZOFRAN) IV, polyethylene glycol, sodium chloride flush, traMADol, traZODone    Anti-infectives (From admission, onward)   None        Objective:   Vitals:   08/04/18 1928 08/04/18 2123 08/05/18 0500 08/05/18 0511  BP:  127/77  128/80  Pulse:  80  87  Resp:  17  17  Temp:  98.3 F (36.8 C)  98.4 F (36.9 C)  TempSrc:      SpO2: 97% 100%  100%  Weight:   97.4 kg   Height:        Wt Readings from Last 3 Encounters:  08/05/18 97.4 kg  02/06/18 93.3 kg  02/01/18 93 kg     Intake/Output Summary (Last 24 hours) at 08/05/2018 0946 Last data filed at 08/05/2018 0900 Gross per 24 hour  Intake 960 ml  Output 2450 ml  Net -1490 ml    Physical Exam  Gen:- Awake Alert, no acute distress HEENT:- [place].AT, No sclera icterus Ear- HOH Neck-Supple Neck,No JVD,.  Lungs-diminished in bases with faint bibasilar rales  CV- S1, S2 normal, irregularly irregular  abd-  +ve B.Sounds, Abd Soft, No tenderness,    Extremity/Skin:- 1+ pitting edema, pedal pulses present, TEDs   Psych-affect is appropriate, oriented x3 Neuro-no new focal deficits, no tremors   Data Review:   Micro Results Recent Results (from the past 240 hour(s))  SARS Coronavirus 2 (CEPHEID- Performed in [place] hospital lab), Hosp Order     Status: None   Collection Time: 08/03/18 11:34 AM   Specimen: Nasopharyngeal Swab  Result Value Ref Range Status   SARS Coronavirus 2 NEGATIVE NEGATIVE Final    Comment: (NOTE) If result is NEGATIVE SARS-CoV-2 target nucleic acids are NOT DETECTED. The SARS-CoV-2 RNA is generally detectable in upper and lower  respiratory specimens during the acute phase of infection. The lowest  concentration of SARS-CoV-2 viral copies this assay can detect is 250  copies / mL. A negative result does not preclude SARS-CoV-2 infection  and should not be used as the sole basis for treatment or other  patient management decisions.  A negative result may occur with  improper  specimen collection / handling, submission of specimen other  than nasopharyngeal swab, presence of viral mutation(s) within the  areas targeted by this assay, and inadequate number of viral copies  (<250 copies / mL). A negative result must be combined with clinical  observations, patient history, and epidemiological information. If result is POSITIVE SARS-CoV-2 target nucleic acids are DETECTED. The SARS-CoV-2 RNA is generally detectable in upper and lower  respiratory specimens dur ing the acute phase of infection.  Positive  results are indicative of active infection with SARS-CoV-2.  Clinical  correlation with patient history and other diagnostic information is  necessary to determine patient infection status.  Positive results do  not rule out bacterial infection or co-infection with  other viruses. If result is PRESUMPTIVE POSTIVE SARS-CoV-2 nucleic acids MAY BE PRESENT.   A presumptive positive result was obtained on the submitted specimen  and confirmed on repeat testing.  While 2019 novel coronavirus  (SARS-CoV-2) nucleic acids may be present in the submitted sample  additional confirmatory testing may be necessary for epidemiological  and / or clinical management purposes  to differentiate between  SARS-CoV-2 and other Sarbecovirus currently known to infect humans.  If clinically indicated additional testing with an alternate test  methodology [phone]) is advised. The SARS-CoV-2 RNA is generally  detectable in upper and lower respiratory sp ecimens during the acute  phase of infection. The expected result is Negative. Fact Sheet for Patients:  StrictlyIdeas.no Fact Sheet for Healthcare Providers: BankingDealers.co.za This test is not yet approved or cleared by the [place] FDA and has been authorized for detection and/or diagnosis of SARS-CoV-2 by FDA under an Emergency Use Authorization (EUA).  This EUA will remain in  effect (meaning this test can be used) for the duration of the COVID-19 declaration under Section 564(b)(1) of the Act, 21 U.S.C. section 360bbb-3(b)(1), unless the authorization is terminated or revoked sooner. Performed at [facility], [address] [place], [place] [address]     Radiology Reports Dg Chest 2 View  Result Date: 08/03/2018 CLINICAL DATA:  Dyspnea with exertion. EXAM: CHEST - 2 VIEW COMPARISON:  Radiograph June 27, 2017. FINDINGS: Stable cardiomegaly. Atherosclerosis of thoracic aorta is noted. Mild central pulmonary vascular congestion is noted. No acute pulmonary disease is noted. Both lungs are clear. The visualized skeletal structures are unremarkable. IMPRESSION: Stable cardiomegaly with central pulmonary vascular congestion. Aortic Atherosclerosis (ICD10-I70.0). Electronically Signed   By: Marijo Conception M.D.   On: 08/03/2018 11:28     CBC Recent Labs  Lab 08/03/18 1046 08/04/18 0512 08/05/18 0441  WBC 4.6 5.0 5.3  HGB 9.3* 8.6* 9.5*  HCT 31.5* 29.9* 32.2*  PLT 174 184 177  MCV 94.6 94.0 92.8  MCH 27.9 27.0 27.4  MCHC 29.5* 28.8* 29.5*  RDW 18.2* 18.3* 18.4*  LYMPHSABS 0.5*  --   --   MONOABS 0.4  --   --   EOSABS 0.2  --   --   BASOSABS 0.0  --   --     Chemistries  Recent Labs  Lab 08/03/18 1046 08/04/18 0512 08/05/18 0441  NA 139 141 142  K 4.1 3.9 3.5  CL 104 106 106  CO2 23 22 24   GLUCOSE 190* 71 107*  BUN 65* 64* 62*  CREATININE 2.20* 2.26* 2.08*  CALCIUM 8.7* 8.7* 8.7*  AST 16  --   --   ALT 11  --   --   ALKPHOS 110  --   --   BILITOT 1.3*  --   --    ------------------------------------------------------------------------------------------------------------------ No results for input(s): CHOL, HDL, LDLCALC, TRIG, CHOLHDL, LDLDIRECT in the last 72 hours.  Lab Results  Component Value Date   HGBA1C 6.6 (H) 02/14/2018    ------------------------------------------------------------------------------------------------------------------ No results for input(s): TSH, T4TOTAL, T3FREE, THYROIDAB in the last 72 hours.  Invalid input(s): FREET3 ------------------------------------------------------------------------------------------------------------------ No results for input(s): VITAMINB12, FOLATE, FERRITIN, TIBC, IRON, RETICCTPCT in the last 72 hours.  Coagulation profile Recent Labs  Lab 08/03/18 1014 08/04/18 0512 08/05/18 0441  INR 3.3* 2.7* 2.5*    No results for input(s): DDIMER in the last 72 hours.  Cardiac Enzymes No results for input(s): CKMB, TROPONINI, MYOGLOBIN in the last 168 hours.  Invalid input(s): CK ------------------------------------------------------------------------------------------------------------------  Component Value Date/Time   BNP 933.0 (H) 08/03/2018 1046     Roxan Hockey M.D on 08/05/2018 at 9:46 AM  Go to [email or web address] - for contact info  [facility] - Office  [phone]

## 2018-08-06 LAB — GLUCOSE, CAPILLARY
Glucose-Capillary: 84 mg/dL (ref 70–99)
Glucose-Capillary: 129 mg/dL — ABNORMAL HIGH (ref 70–99)

## 2018-08-06 LAB — BASIC METABOLIC PANEL
Anion gap: 9 (ref 5–15)
BUN: 57 mg/dL — ABNORMAL HIGH (ref 8–23)
CO2: 27 mmol/L (ref 22–32)
Calcium: 8.4 mg/dL — ABNORMAL LOW (ref 8.9–10.3)
Chloride: 104 mmol/L (ref 98–111)
Creatinine, Ser: 1.93 mg/dL — ABNORMAL HIGH (ref 0.44–1.00)
GFR calc Af Amer: 28 mL/min — ABNORMAL LOW (ref >60)
GFR calc non Af Amer: 24 mL/min — ABNORMAL LOW (ref >60)
Glucose, Bld: 88 mg/dL (ref 70–99)
Potassium: 3.4 mmol/L — ABNORMAL LOW (ref 3.5–5.1)
Sodium: 140 mmol/L (ref 135–145)

## 2018-08-06 LAB — MAGNESIUM: Magnesium: 2.2 mg/dL (ref 1.7–2.4)

## 2018-08-06 LAB — PROTIME-INR
INR: 2.3 — ABNORMAL HIGH (ref 0.8–1.2)
Prothrombin Time: 24.8 seconds — ABNORMAL HIGH (ref 11.4–15.2)

## 2018-08-06 MED ORDER — TORSEMIDE 20 MG PO TABS: 20.0000 mg | ORAL_TABLET | ORAL | 5 refills | Status: DC

## 2018-08-06 MED ORDER — CARVEDILOL 12.5 MG PO TABS: 12.5000 mg | ORAL_TABLET | Freq: Two times a day (BID) | ORAL | 2 refills | Status: AC

## 2018-08-06 MED ORDER — POTASSIUM CHLORIDE ER 20 MEQ PO TBCR: 20.0000 meq | EXTENDED_RELEASE_TABLET | ORAL | 5 refills | Status: AC

## 2018-08-06 MED ORDER — ACETAMINOPHEN 325 MG PO TABS: 650.0000 mg | ORAL_TABLET | Freq: Four times a day (QID) | ORAL | 1 refills | Status: AC | PRN

## 2018-08-06 MED ORDER — POTASSIUM CHLORIDE CRYS ER 20 MEQ PO TBCR
40.0000 meq | EXTENDED_RELEASE_TABLET | Freq: Once | ORAL | Status: AC
  Administered 2018-08-06: 40 meq via ORAL
  Filled 2018-08-06: qty 2

## 2018-08-06 NOTE — Progress Notes (Signed)
IV removed, patient tolerated well. Reviewed AVS with patient and patient's daughter, Glenard Haring, who verbalized understanding.  Patient awaiting arrival of daughter to transport home.  Patient to be taken to short stay lobby via wheelchair.

## 2018-08-06 NOTE — Discharge Instructions (Signed)
1)Very low-salt diet advised 2)Weigh yourself daily, call if you gain more than 3 pounds in 1 day or more than 5 pounds in 1 week as your diuretic medications may need to be adjusted 3)Limit your Fluid  intake to no more than 60 ounces (1.8 Liters) per day 4) Take Torsemide 2 tablets (40 mg)  every Mondays Wednesdays and Fridays Tuesdays, Thursdays, Saturdays and Sundays, take 1 tablet (20 MG)  5)Please put on TED/compression stockings every morning and take them off at bedtime 6) you are taking Coumadin/warfarin for blood thinner so Avoid ibuprofen/Advil/Aleve/Motrin/Goody Powders/Naproxen/BC powders/Meloxicam/Diclofenac/Indomethacin and other Nonsteroidal anti-inflammatory medications as these will make you more likely to bleed and can cause stomach ulcers, can also cause Kidney problems.  7) follow-up with primary doctor for repeat INR, and BMP blood around 08/11/2018

## 2018-08-06 NOTE — Progress Notes (Signed)
ANTICOAGULATION CONSULT NOTE - Follow up [place] for Coumadin Indication: atrial fibrillation  No Known Allergies  Patient Measurements: 5'5" Vital Signs: Temp: 98.7 F (37.1 C) (07/11 0505) Temp Source: Oral (07/11 0505) BP: 115/65 (07/11 0505) Pulse Rate: 90 (07/11 0505)  Labs: Recent Labs    08/03/18 1046 08/03/18 1239 08/04/18 0512 08/05/18 0441 08/06/18 0509  HGB 9.3*  --  8.6* 9.5*  --   HCT 31.5*  --  29.9* 32.2*  --   PLT 174  --  184 177  --   LABPROT  --   --  28.2* 26.7* 24.8*  INR  --   --  2.7* 2.5* 2.3*  CREATININE 2.20*  --  2.26* 2.08* 1.93*  TROPONINIHS 21.00* 20.00*  --   --   --     Estimated Creatinine Clearance: 25.4 mL/min (A) (by C-G formula based on SCr of 1.93 mg/dL (H)).   Medical History: Past Medical History:  Diagnosis Date  . Anxiety   . Atrial fibrillation ([place])   . CHF (congestive heart failure) ([place])   . Diabetes mellitus without complication ([place])   . Hypertension     Medications:  See med rec Assessment: Patient presented to ED with SOB and edema in extremities. She is chronically anticoagulated with coumadin for afib. Pharmacy asked to dose.  INR is therapeutic this AM at 2.3.  Home dose adjusted at clinic to 5mg  on Monday and Thursday.  2.5mg  all other days  Goal of Therapy:  INR 2-3 Monitor platelets by anticoagulation protocol: Yes   Plan:  Coumadin 2.5mg  x 1 today Daily PT-INR Monitor for s/s of bleeding  Thomasenia Sales, PharmD, MBA, BCGP Clinical Pharmacist  08/06/2018,8:06 AM

## 2018-08-06 NOTE — Discharge Summary (Signed)
Barbara Stone, is a 83 y.o. female  DOB 22-Jun-1935  MRN [id number].  Admission date:  08/03/2018  Admitting Physician  Roxan Hockey, MD  Discharge Date:  08/06/2018   Primary MD  Lemmie Evens, MD  Recommendations for primary care physician for things to follow:   1)Very low-salt diet advised 2)Weigh yourself daily, call if you gain more than 3 pounds in 1 day or more than 5 pounds in 1 week as your diuretic medications may need to be adjusted 3)Limit your Fluid  intake to no more than 60 ounces (1.8 Liters) per day 4) Take Torsemide 2 tablets (40 mg)  every Mondays Wednesdays and Fridays Tuesdays, Thursdays, Saturdays and Sundays, take 1 tablet (20 MG)  5)Please put on TED/compression stockings every morning and take them off at bedtime 6) you are taking Coumadin/warfarin for blood thinner so Avoid ibuprofen/Advil/Aleve/Motrin/Goody Powders/Naproxen/BC powders/Meloxicam/Diclofenac/Indomethacin and other Nonsteroidal anti-inflammatory medications as these will make you more likely to bleed and can cause stomach ulcers, can also cause Kidney problems.  7) follow-up with primary doctor for repeat INR, and BMP blood around 08/11/2018   Admission Diagnosis  Shortness of Breath   Discharge Diagnosis  Shortness of Breath    Principal Problem:   Acute on chronic HFrEF (heart failure with reduced ejection fraction)/EF 25 % Active Problems:   Chronic kidney disease (CKD), stage IV (severe) (HCC)   Type 2 diabetes mellitus with stage 4 chronic kidney disease (HCC)   Atrial fibrillation (HCC) [I48.91]   HTN (hypertension)   Acute exacerbation of CHF (congestive heart failure) (HCC)   Anticoagulant long-term use/Coumadin for Afib      Past Medical History:  Diagnosis Date  . Anxiety   . Atrial fibrillation ([place])   . CHF (congestive heart failure) ([place])   . Diabetes mellitus without complication ([place])    . Hypertension     Past Surgical History:  Procedure Laterality Date  . BACK SURGERY    . EYE SURGERY Left        HPI  from the history and physical done on the day of admission:    Barbara Stone  is a 83 y.o. female  with medical history significant forchronic systolic CHF (EF 83% by TTE 06/2017), atrial fibrillation on Coumadin, type 2 diabetes, hypertension, and CKD stage IV presents to ED with worsening dyspnea, dyspnea on exertion, weight gain and lower extremity edema --- Symptoms have been worsening over the last couple of weeks but became particularly worse over the last 24 hours  No frank chest pains,  History is difficult as patient is very very hard of hearing  --Apparently no dizziness, no syncope no pleuritic symptoms.. She has been compliant with her medications including Coumadin INR is above 3  --No fevers, no productive cough  In ED.Mar[facility] Kitchen COVID-19 is negative, INR is 3.3, BNP is 933, troponin borderline and flat in the patient with CKD IV-- -- In ED... Chest x-ray and clinical exam consistent with CHF exacerbation     Hospital Course:  Brief summary 83 y.o.femalewith medical history significant forchronic systolic CHF (EF 13% by TTE 06/2017), atrial fibrillation on Coumadin, type 2 diabetes, hypertension, and CKD stage IV admitted on 08/03/2018 with worsening dyspnea, worsening lower extremity edema weight gain and found to have acute on chronic systolic dysfunction CHF exacerbation, diuresed well with IV Lasix,  her weight is down to 199 pounds from 214 pounds this admission,  A/p 1)HFrEF--- symptomatically improving slowly, fluid balance negative, baseline wt is usually around 93 kg (204 to 205lb),Patient has diuresed well, her weight is down to 199 pounds from 214 pounds this admission, patient was admitted with acute on chronic systolic dysfunction CHF, last known EF 25%,--troponins are flat, treated with IV Lasix,  --dyspnea at rest resolved,,  orthopnea resolved, dyspnea on exertion much improved  2)CKD IV--- renal function appears to be close to baseline, continue to watch renal function closely with IV diuresis, creatinine is currently around 2.0  3) acute on chronic anemia--- hemoglobin is stable at 9.5,  baseline usually between 9 and 10, no evidence of ongoing bleeding, patient with history of iron deficiency anemia superimposed on anemia of CKD  4) chronic atrial fibrillation--- stable, continue Coreg for rate control, Coumadin per pharmacy INR is currently therapeutic at 2.3  5)DM2--excellent control, , continue glimepiride with breakfast, A1c 6.3   Code Status : Full code  Family Communication:   (patient is alert, awake and coherent)--Discussed with daughter Barbara Stone     Disposition Plan  : Home , declined home health services  Discharge Condition: Stable  Follow UP  Follow-up Information    Lemmie Evens, MD. Go on 08/09/2018.   Specialty: Family Medicine Why: Please arrive at 1:30 for a 1:40 hospital follow up appointment.  Contact information: [place] [phone]           Diet and Activity recommendation:  As advised  Discharge Instructions    Discharge Instructions    Call MD for:  difficulty breathing, headache or visual disturbances   Complete by: As directed    Call MD for:  persistant dizziness or light-headedness   Complete by: As directed    Call MD for:  severe uncontrolled pain   Complete by: As directed    Call MD for:  temperature >100.4   Complete by: As directed    Diet - low sodium heart healthy   Complete by: As directed    Discharge instructions   Complete by: As directed    1)Very low-salt diet advised 2)Weigh yourself daily, call if you gain more than 3 pounds in 1 day or more than 5 pounds in 1 week as your diuretic medications may need to be adjusted 3)Limit your Fluid  intake to no more than 60 ounces (1.8 Liters) per day 4)  Take Torsemide 2 tablets (40 mg)  every Mondays Wednesdays and Fridays Tuesdays, Thursdays, Saturdays and Sundays, take 1 tablet (20 MG)  5)Please put on TED/compression stockings every morning and take them off at bedtime 6) you are taking Coumadin/warfarin for blood thinner so Avoid ibuprofen/Advil/Aleve/Motrin/Goody Powders/Naproxen/BC powders/Meloxicam/Diclofenac/Indomethacin and other Nonsteroidal anti-inflammatory medications as these will make you more likely to bleed and can cause stomach ulcers, can also cause Kidney problems.  7) follow-up with primary doctor for repeat INR, and BMP blood around 08/11/2018   Increase activity slowly   Complete by: As directed         Discharge Medications     Allergies as of 08/06/2018   No Known  Allergies     Medication List    STOP taking these medications   furosemide 40 MG tablet Commonly known as: LASIX   potassium chloride SA 20 MEQ tablet Commonly known as: K-DUR     TAKE these medications   acetaminophen 325 MG tablet Commonly known as: TYLENOL Take 2 tablets (650 mg total) by mouth every 6 (six) hours as needed for mild pain or fever (or Fever >/= 101).   aspirin EC 81 MG tablet Take 81 mg by mouth daily.   carvedilol 12.5 MG tablet Commonly known as: COREG Take 1 tablet (12.5 mg total) by mouth 2 (two) times daily.   Combigan 0.2-0.5 % ophthalmic solution Generic drug: brimonidine-timolol Place 1 drop into both eyes 2 (two) times daily.   ergocalciferol 1.25 MG (50000 UT) capsule Commonly known as: VITAMIN D2 Take 1 tablet by mouth daily.   FeroSul 325 (65 FE) MG tablet Generic drug: ferrous sulfate Take 1 tablet by mouth 2 (two) times daily.   glimepiride 1 MG tablet Commonly known as: AMARYL Take 1 mg by mouth daily with breakfast.   guaiFENesin-codeine 100-10 MG/5ML syrup Take 1 mL by mouth 4 (four) times daily.   LORazepam 0.5 MG tablet Commonly known as: ATIVAN Take 1 tablet by mouth as needed.    Potassium Chloride ER 20 MEQ Tbcr Take 20 mEq by mouth See admin instructions. Take 2 tablets (40 meq)  every M/W/F, take 1 tablet (20 Meq) every T/T/S/S   torsemide 20 MG tablet Commonly known as: Demadex Take 1 tablet (20 mg total) by mouth See admin instructions. --- Take 2 tablets (40 mg) every M/W/F, take 1 tablet (20 MG) every T/T/S/S   traMADol 50 MG tablet Commonly known as: ULTRAM Take 1 tablet (50 mg total) by mouth every 6 (six) hours as needed for severe pain.   Travatan Z 0.004 % Soln ophthalmic solution Generic drug: Travoprost (BAK Free) Place 1 drop into both eyes at bedtime.   VITAMIN D PO Take 1 tablet by mouth daily.   warfarin 5 MG tablet Commonly known as: COUMADIN Take as directed. If you are unsure how to take this medication, talk to your nurse or doctor. Original instructions: Take 1 tablet daily except 1/2 tablet on Fridays What changed: additional instructions       Major procedures and Radiology Reports - PLEASE review detailed and final reports for all details, in brief -    Dg Chest 2 View  Result Date: 08/03/2018 CLINICAL DATA:  Dyspnea with exertion. EXAM: CHEST - 2 VIEW COMPARISON:  Radiograph June 27, 2017. FINDINGS: Stable cardiomegaly. Atherosclerosis of thoracic aorta is noted. Mild central pulmonary vascular congestion is noted. No acute pulmonary disease is noted. Both lungs are clear. The visualized skeletal structures are unremarkable. IMPRESSION: Stable cardiomegaly with central pulmonary vascular congestion. Aortic Atherosclerosis (ICD10-I70.0). Electronically Signed   By: Marijo Conception M.D.   On: 08/03/2018 11:28    Micro Results   Recent Results (from the past 240 hour(s))  SARS Coronavirus 2 (CEPHEID- Performed in [place] hospital lab), Hosp Order     Status: None   Collection Time: 08/03/18 11:34 AM   Specimen: Nasopharyngeal Swab  Result Value Ref Range Status   SARS Coronavirus 2 NEGATIVE NEGATIVE Final    Comment:  (NOTE) If result is NEGATIVE SARS-CoV-2 target nucleic acids are NOT DETECTED. The SARS-CoV-2 RNA is generally detectable in upper and lower  respiratory specimens during the acute phase of infection. The lowest  concentration of SARS-CoV-2 viral copies this assay can detect is 250  copies / mL. A negative result does not preclude SARS-CoV-2 infection  and should not be used as the sole basis for treatment or other  patient management decisions.  A negative result may occur with  improper specimen collection / handling, submission of specimen other  than nasopharyngeal swab, presence of viral mutation(s) within the  areas targeted by this assay, and inadequate number of viral copies  (<250 copies / mL). A negative result must be combined with clinical  observations, patient history, and epidemiological information. If result is POSITIVE SARS-CoV-2 target nucleic acids are DETECTED. The SARS-CoV-2 RNA is generally detectable in upper and lower  respiratory specimens dur ing the acute phase of infection.  Positive  results are indicative of active infection with SARS-CoV-2.  Clinical  correlation with patient history and other diagnostic information is  necessary to determine patient infection status.  Positive results do  not rule out bacterial infection or co-infection with other viruses. If result is PRESUMPTIVE POSTIVE SARS-CoV-2 nucleic acids MAY BE PRESENT.   A presumptive positive result was obtained on the submitted specimen  and confirmed on repeat testing.  While 2019 novel coronavirus  (SARS-CoV-2) nucleic acids may be present in the submitted sample  additional confirmatory testing may be necessary for epidemiological  and / or clinical management purposes  to differentiate between  SARS-CoV-2 and other Sarbecovirus currently known to infect humans.  If clinically indicated additional testing with an alternate test  methodology [phone]) is advised. The SARS-CoV-2 RNA is  generally  detectable in upper and lower respiratory sp ecimens during the acute  phase of infection. The expected result is Negative. Fact Sheet for Patients:  StrictlyIdeas.no Fact Sheet for Healthcare Providers: BankingDealers.co.za This test is not yet approved or cleared by the [place] FDA and has been authorized for detection and/or diagnosis of SARS-CoV-2 by FDA under an Emergency Use Authorization (EUA).  This EUA will remain in effect (meaning this test can be used) for the duration of the COVID-19 declaration under Section 564(b)(1) of the Act, 21 U.S.C. section 360bbb-3(b)(1), unless the authorization is terminated or revoked sooner. Performed at [facility], [address] [place], [place] [address]        Today   Subjective    Dima Mini today has no complaints, dyspnea on trace-resolved, orthopnea is resolved, patient has very minimal dyspnea on exertion, Voiding well        Patient has been seen and examined prior to discharge   Objective   Blood pressure 115/65, pulse 90, temperature 98.7 F (37.1 C), temperature source Oral, resp. rate 17, height 5\' 5"  (1.651 m), weight 90.6 kg, SpO2 99 %.   Intake/Output Summary (Last 24 hours) at 08/06/2018 1027 Last data filed at 08/06/2018 0905 Gross per 24 hour  Intake 483 ml  Output 2450 ml  Net -1967 ml    Exam Gen:- Awake Alert, no acute distress , able to speak in sentences HEENT:- [place].AT, No sclera icterus Ears- Very HOH Neck-Supple Neck,No JVD,.  Lungs-improved air movement, no wheezing CV- S1, S2 normal, regular, 3/6 SM Abd-  +ve B.Sounds, Abd Soft, No tenderness,    Extremity/Skin:- No significant edema,   good pulses Psych-affect is appropriate, oriented x3 Neuro-no new focal deficits, no tremors    Data Review   CBC w Diff:  Lab Results  Component Value Date   WBC 5.3 08/05/2018   HGB 9.5 (L) 08/05/2018   HCT  32.2 (L) 08/05/2018   PLT  177 08/05/2018   LYMPHOPCT 10 08/03/2018   MONOPCT 9 08/03/2018   EOSPCT 4 08/03/2018   BASOPCT 0 08/03/2018    CMP:  Lab Results  Component Value Date   NA 140 08/06/2018   K 3.4 (L) 08/06/2018   CL 104 08/06/2018   CO2 27 08/06/2018   BUN 57 (H) 08/06/2018   CREATININE 1.93 (H) 08/06/2018   CREATININE 1.76 (H) 02/03/2017   PROT 7.1 08/03/2018   ALBUMIN 3.8 08/03/2018   BILITOT 1.3 (H) 08/03/2018   ALKPHOS 110 08/03/2018   AST 16 08/03/2018   ALT 11 08/03/2018  .   Total Discharge time is about 33 minutes  Roxan Hockey M.D on 08/06/2018 at 10:27 AM  Go to [email or web address] -  for contact info  [facility] - Office  [phone]

## 2018-08-29 ENCOUNTER — Ambulatory Visit (INDEPENDENT_AMBULATORY_CARE_PROVIDER_SITE_OTHER): Payer: Medicare Other | Admitting: Physician Assistant

## 2018-08-29 ENCOUNTER — Other Ambulatory Visit: Payer: Self-pay

## 2018-08-29 ENCOUNTER — Encounter: Payer: Self-pay | Admitting: Physician Assistant

## 2018-08-29 ENCOUNTER — Other Ambulatory Visit (HOSPITAL_COMMUNITY)
Admission: RE | Admit: 2018-08-29 | Discharge: 2018-08-29 | Disposition: A | Discharge status: Home / Self Care | Payer: Medicare Other | Source: Ambulatory Visit | Attending: Physician Assistant | Admitting: Physician Assistant

## 2018-08-29 VITALS — BP 115/66 | HR 77 | Temp 96.9°F | Ht 66.0 in | Wt 198.0 lb

## 2018-08-29 DIAGNOSIS — I4819 Other persistent atrial fibrillation: Secondary | ICD-10-CM | POA: Diagnosis not present

## 2018-08-29 DIAGNOSIS — I5022 Chronic systolic (congestive) heart failure: Secondary | ICD-10-CM | POA: Insufficient documentation

## 2018-08-29 DIAGNOSIS — I10 Essential (primary) hypertension: Secondary | ICD-10-CM | POA: Diagnosis not present

## 2018-08-29 DIAGNOSIS — N189 Chronic kidney disease, unspecified: Secondary | ICD-10-CM

## 2018-08-29 DIAGNOSIS — N289 Disorder of kidney and ureter, unspecified: Secondary | ICD-10-CM | POA: Diagnosis not present

## 2018-08-29 LAB — BASIC METABOLIC PANEL
Anion gap: 9 (ref 5–15)
BUN: 72 mg/dL — ABNORMAL HIGH (ref 8–23)
CO2: 25 mmol/L (ref 22–32)
Calcium: 9 mg/dL (ref 8.9–10.3)
Chloride: 105 mmol/L (ref 98–111)
Creatinine, Ser: 2.58 mg/dL — ABNORMAL HIGH (ref 0.44–1.00)
GFR calc Af Amer: 19 mL/min — ABNORMAL LOW (ref >60)
GFR calc non Af Amer: 17 mL/min — ABNORMAL LOW (ref >60)
Glucose, Bld: 126 mg/dL — ABNORMAL HIGH (ref 70–99)
Potassium: 4.7 mmol/L (ref 3.5–5.1)
Sodium: 139 mmol/L (ref 135–145)

## 2018-08-29 NOTE — Progress Notes (Addendum)
Cardiology Office Note   Date:  08/29/2018   ID:  Barbara Stone, DOB 1935-07-18, MRN [id number]  PCP:  Lemmie Evens, MD Cardiologist:  Kate Sable, MD 02/04/2018 Barbara Ferries, PA-C   No chief complaint on file.   History of Present Illness: Barbara Stone is a 83 y.o. female with a history of S-CHF w/ EF 25%, CKD IV, DM, HTN, Afib on coumadin  Admitted 7/8-7/11/2018 with CHF exacerbation, not seen by cards, DC weight 199 pounds  Barbara Stone presents for cardiology follow up.  She is here today with her daughter.  She is very HOH, has 1 hearing aid in.   Weighing almost daily. Does not wake with LE edema. Is walking some in the house, but gets SOB w/ exertion. Feels like she is walking more than before in the hospital.   No PND.  Sleeps on one pillow chronically, no recent change.  She likes fast food, her daughter is trying to convince her that she should not eat fried chicken from [employer] or hamburger from [employer].  Of note, the hamburger from [employer] has 700 mg of sodium.  Her daughter is doing the cooking and is not using salt.  She has No salt which she is using.  She has not had chest pain.  She has not had palpitations, no presyncope or syncope.  She is completely unaware of the atrial fibrillation.  No bleeding issues on the Coumadin.  Her legs itch every day.  She has scratch marks on them and she has scratch them hard enough at times so that they bleed.  She is not talked to her family physician about this.   Past Medical History:  Diagnosis Date   Anxiety    Atrial fibrillation (HCC)    Chronic systolic CHF (congestive heart failure) ([place])    Diabetes mellitus without complication ([place])    Hypertension     Past Surgical History:  Procedure Laterality Date   BACK SURGERY     EYE SURGERY Left     Current Outpatient Medications  Medication Sig Dispense Refill   acetaminophen (TYLENOL) 325 MG tablet Take 2 tablets (650 mg  total) by mouth every 6 (six) hours as needed for mild pain or fever (or Fever >/= 101). 15 tablet 1   aspirin EC 81 MG tablet Take 81 mg by mouth daily.     carvedilol (COREG) 12.5 MG tablet Take 1 tablet (12.5 mg total) by mouth 2 (two) times daily. 60 tablet 2   Cholecalciferol (VITAMIN D PO) Take 1 tablet by mouth daily.     COMBIGAN 0.2-0.5 % ophthalmic solution Place 1 drop into both eyes 2 (two) times daily.  6   FEROSUL 325 (65 Fe) MG tablet Take 1 tablet by mouth 2 (two) times daily.  11   glimepiride (AMARYL) 1 MG tablet Take 1 mg by mouth daily with breakfast.     LORazepam (ATIVAN) 0.5 MG tablet Take 1 tablet by mouth as needed.  5   Potassium Chloride ER 20 MEQ TBCR Take 20 mEq by mouth See admin instructions. Take 2 tablets (40 meq)  every M/W/F, take 1 tablet (20 Meq) every T/T/S/S 50 tablet 5   torsemide (DEMADEX) 20 MG tablet Take 1 tablet (20 mg total) by mouth See admin instructions. --- Take 2 tablets (40 mg) every M/W/F, take 1 tablet (20 MG) every T/T/S/S 50 tablet 5   traMADol (ULTRAM) 50 MG tablet Take 1 tablet (50 mg total) by  mouth every 6 (six) hours as needed for severe pain. 12 tablet 0   TRAVATAN Z 0.004 % SOLN ophthalmic solution Place 1 drop into both eyes at bedtime.  6   warfarin (COUMADIN) 5 MG tablet Take 1 tablet daily except 1/2 tablet on Fridays (Patient taking differently: Take 5mg  on Monday and Thursday, then 2.5mg  all other days) 90 tablet 3   No current facility-administered medications for this visit.     Allergies:   Patient has no known allergies.    Social History:  The patient  reports that she has never smoked. She has never used smokeless tobacco. She reports that she does not drink alcohol or use drugs.   Family History:  The patient's family history is not on file.  She indicated that her mother is deceased. She indicated that her father is deceased. She indicated that the status of her neg hx is unknown.   ROS:  Please see the  history of present illness. All other systems are reviewed and negative.    PHYSICAL EXAM: VS:  BP 115/66    Pulse 77    Temp (!) 96.9 F (36.1 C)    Ht 5\' 6"  (1.676 m)    Wt 198 lb (89.8 kg)    BMI 31.96 kg/m  , BMI Body mass index is 31.96 kg/m. GEN: Well nourished, well developed, female in no acute distress HEENT: normal for age  Neck: JVD8-9 centimeters, no carotid bruit, no masses Cardiac: Irregular rate and rhythm; 2/6 murmur, no rubs, or gallops Respiratory: Decreased breath sounds bases bilaterally, normal work of breathing GI: soft, nontender, nondistended, + BS MS: no deformity or atrophy; pedal edema; distal pulses are 2+ in upper extremities, difficult to feel in lower extremities due to edema, capillary refill is within normal limits Skin: warm and dry, no rash; her legs appear to have chronic stasis changes in the anterior aspect, but there are scratch marks and excoriations on both Neuro:  Strength and sensation are intact Psych: euthymic mood, full affect   EKG:  EKG is not ordered today.  ECHO: 06/28/2017 - Left ventricle: The cavity size was normal. Wall thickness was   increased in a pattern of mild LVH. The estimated ejection   fraction was 25%. Diffuse hypokinesis. There is moderate   hypokinesis of the apicalinferior and apical myocardium. The   study was not technically sufficient to allow evaluation of LV   diastolic dysfunction due to atrial fibrillation. - Ventricular septum: Septal motion showed abnormal function and   dyssynergy. - Aortic valve: Moderately calcified annulus. Probably trileaflet.   There was mild regurgitation. - Mitral valve: Mildly calcified annulus. There was moderate   regurgitation directed posteriorly. - Right atrium: Central venous pressure (est): 3 mm Hg. - Tricuspid valve: There was moderate regurgitation. - Pulmonary arteries: Systolic pressure was severely increased. PA   peak pressure: 70 mm Hg (S). - Pericardium,  extracardiac: There was no pericardial effusion.   Recent Labs: 08/03/2018: ALT 11; B Natriuretic Peptide 933.0 08/05/2018: Hemoglobin 9.5; Platelets 177 08/06/2018: Magnesium 2.2 08/29/2018: BUN 72; Creatinine, Ser 2.58; Potassium 4.7; Sodium 139  CBC    Component Value Date/Time   WBC 5.3 08/05/2018 0441   RBC 3.47 (L) 08/05/2018 0441   HGB 9.5 (L) 08/05/2018 0441   HCT 32.2 (L) 08/05/2018 0441   PLT 177 08/05/2018 0441   MCV 92.8 08/05/2018 0441   MCH 27.4 08/05/2018 0441   MCHC 29.5 (L) 08/05/2018 0441   RDW 18.4 (H)  08/05/2018 0441   LYMPHSABS 0.5 (L) 08/03/2018 1046   MONOABS 0.4 08/03/2018 1046   EOSABS 0.2 08/03/2018 1046   BASOSABS 0.0 08/03/2018 1046   CMP Latest Ref Rng & Units 08/29/2018 08/06/2018 08/05/2018  Glucose 70 - 99 mg/dL 126(H) 88 107(H)  BUN 8 - 23 mg/dL 72(H) 57(H) 62(H)  Creatinine 0.44 - 1.00 mg/dL 2.58(H) 1.93(H) 2.08(H)  Sodium 135 - 145 mmol/L 139 140 142  Potassium 3.5 - 5.1 mmol/L 4.7 3.4(L) 3.5  Chloride 98 - 111 mmol/L 105 104 106  CO2 22 - 32 mmol/L 25 27 24   Calcium 8.9 - 10.3 mg/dL 9.0 8.4(L) 8.7(L)  Total Protein 6.5 - 8.1 g/dL - - -  Total Bilirubin 0.3 - 1.2 mg/dL - - -  Alkaline Phos 38 - 126 U/L - - -  AST 15 - 41 U/L - - -  ALT 0 - 44 U/L - - -     Lipid Panel Lab Results  Component Value Date   CHOL 142 02/14/2018   HDL 35 (L) 02/14/2018   LDLCALC 94 02/14/2018   TRIG 65 02/14/2018   CHOLHDL 4.1 02/14/2018      Wt Readings from Last 3 Encounters:  08/29/18 198 lb (89.8 kg)  08/06/18 199 lb 11.8 oz (90.6 kg)  02/06/18 205 lb 11 oz (93.3 kg)     Other studies Reviewed: Additional studies/ records that were reviewed today include: Office notes, hospital records and testing.  ASSESSMENT AND PLAN:  1.  Chronic systolic CHF - She has some mild volume overload by exam, neck veins are up a little bit and pedal edema. -However, her weight is actually below her hospital discharge weight. -She also feels that her respiratory  status is improved from admission, although poor at baseline - She is encouraged to increase activity as tolerated and try to build her strength back. - Her daughter was given guidelines on fluid intake, sodium intake and daily weights.  They were given a daily weight sheet. -**A BMET was ordered, and it was completed prior to the note being completed. - Her BUN and creatinine are significantly higher than discharge. - The patient will be called with medication adjustments -Hold the potassium and torsemide for 2 days, then restart at 1 tablet daily  2.  Acute on chronic CKD IV: -Baseline creatinine seems to be approximately 1.9, she was 2.2 on admission 1.93 at discharge - Creatinine today is 2.58 with a BUN of 72, also higher than normal for her -She is not on ACE inhibitor or ARB - Hold the torsemide for 2 days, hold the potassium as well, restart it 1 tablet every day -She will need a Bmet in a week  3.  Hypertension: -Her blood pressure is well controlled on current meds  4.  Persistent atrial fibrillation - Her heart rate is controlled and her blood pressure is stable on the current dose of carvedilol - She is not having any bleeding issues on Coumadin - Keep INR checks and continue beta-blocker  Current medicines are reviewed at length with the patient today.  The patient does not have concerns regarding medicines.  The following changes have been made: See above.  I was able to reach the daughter by phone and let her know the instructions  Labs/ tests ordered today include:   Orders Placed This Encounter  Procedures   Basic Metabolic Panel (BMET)     Disposition:   FU with Kate Sable, MD  Signed, Barbara Ferries, PA-C  08/29/2018 5:25 PM    [place] Phone: [phone]; Fax: [phone]

## 2018-08-29 NOTE — Patient Instructions (Addendum)
Medication Instructions:  Your physician recommends that you continue on your current medications as directed. Please refer to the Current Medication list given to you today.   Labwork: Your physician recommends that you return for lab work [place]:COBTV  Testing/Procedures: None  Follow-Up: Your physician recommends that you schedule a follow-up appointment with Dr. Bronson Ing 10/07/2018   Any Other Special Instructions Will Be Listed Below (If Applicable).     If you need a refill on your cardiac medications before your next appointment, please call your pharmacy.  Thank you for choosing [facility]!   WEIGH DAILY, every am, wearing the same amount of clothing. Write it down. Record weights, contact Dr Bronson Ing for weight gain of 3 lbs in a day or 5 lbs in a week Limit sodium to 500 mg per meal, total 2000 mg per day Limit all liquids to 1.5-2 liters/quarts per day

## 2018-09-08 ENCOUNTER — Ambulatory Visit (INDEPENDENT_AMBULATORY_CARE_PROVIDER_SITE_OTHER): Payer: Medicare Other | Admitting: *Deleted

## 2018-09-08 ENCOUNTER — Other Ambulatory Visit: Payer: Self-pay

## 2018-09-08 DIAGNOSIS — Z5181 Encounter for therapeutic drug level monitoring: Secondary | ICD-10-CM | POA: Diagnosis not present

## 2018-09-08 DIAGNOSIS — I4891 Unspecified atrial fibrillation: Secondary | ICD-10-CM

## 2018-09-08 LAB — POCT INR: INR: 2.5 (ref 2.0–3.0)

## 2018-09-08 NOTE — Patient Instructions (Signed)
Continue coumadin 1/2 tablet daily except 1 tablet on Mondays and Thursdays Recheck in 4 weeks   

## 2018-09-19 ENCOUNTER — Ambulatory Visit: Payer: Medicare Other | Admitting: Diagnostic Neuroimaging

## 2018-09-23 ENCOUNTER — Other Ambulatory Visit: Payer: Self-pay

## 2018-09-23 ENCOUNTER — Encounter (INDEPENDENT_AMBULATORY_CARE_PROVIDER_SITE_OTHER): Payer: Medicare Other | Admitting: Ophthalmology

## 2018-09-23 DIAGNOSIS — E113513 Type 2 diabetes mellitus with proliferative diabetic retinopathy with macular edema, bilateral: Secondary | ICD-10-CM

## 2018-09-23 DIAGNOSIS — I10 Essential (primary) hypertension: Secondary | ICD-10-CM | POA: Diagnosis not present

## 2018-09-23 DIAGNOSIS — H43811 Vitreous degeneration, right eye: Secondary | ICD-10-CM

## 2018-09-23 DIAGNOSIS — E11311 Type 2 diabetes mellitus with unspecified diabetic retinopathy with macular edema: Secondary | ICD-10-CM

## 2018-09-23 DIAGNOSIS — H35033 Hypertensive retinopathy, bilateral: Secondary | ICD-10-CM | POA: Diagnosis not present

## 2018-10-07 ENCOUNTER — Telehealth: Payer: Self-pay

## 2018-10-07 ENCOUNTER — Other Ambulatory Visit (HOSPITAL_COMMUNITY)
Admission: RE | Admit: 2018-10-07 | Discharge: 2018-10-07 | Disposition: A | Discharge status: Home / Self Care | Payer: Medicare Other | Source: Ambulatory Visit | Attending: Cardiovascular Disease | Admitting: Cardiovascular Disease

## 2018-10-07 ENCOUNTER — Encounter: Payer: Self-pay | Admitting: Cardiovascular Disease

## 2018-10-07 ENCOUNTER — Ambulatory Visit (INDEPENDENT_AMBULATORY_CARE_PROVIDER_SITE_OTHER): Payer: Medicare Other | Admitting: Cardiovascular Disease

## 2018-10-07 ENCOUNTER — Other Ambulatory Visit: Payer: Self-pay

## 2018-10-07 VITALS — BP 104/64 | Temp 97.9°F | Ht 67.0 in | Wt 202.0 lb

## 2018-10-07 DIAGNOSIS — N184 Chronic kidney disease, stage 4 (severe): Secondary | ICD-10-CM

## 2018-10-07 DIAGNOSIS — I38 Endocarditis, valve unspecified: Secondary | ICD-10-CM | POA: Diagnosis not present

## 2018-10-07 DIAGNOSIS — I4819 Other persistent atrial fibrillation: Secondary | ICD-10-CM | POA: Diagnosis not present

## 2018-10-07 DIAGNOSIS — I5022 Chronic systolic (congestive) heart failure: Secondary | ICD-10-CM

## 2018-10-07 DIAGNOSIS — I428 Other cardiomyopathies: Secondary | ICD-10-CM

## 2018-10-07 DIAGNOSIS — I10 Essential (primary) hypertension: Secondary | ICD-10-CM

## 2018-10-07 LAB — BASIC METABOLIC PANEL
Anion gap: 11 (ref 5–15)
BUN: 90 mg/dL — ABNORMAL HIGH (ref 8–23)
CO2: 22 mmol/L (ref 22–32)
Calcium: 9 mg/dL (ref 8.9–10.3)
Chloride: 105 mmol/L (ref 98–111)
Creatinine, Ser: 2.6 mg/dL — ABNORMAL HIGH (ref 0.44–1.00)
GFR calc Af Amer: 19 mL/min — ABNORMAL LOW (ref >60)
GFR calc non Af Amer: 17 mL/min — ABNORMAL LOW (ref >60)
Glucose, Bld: 214 mg/dL — ABNORMAL HIGH (ref 70–99)
Potassium: 4.5 mmol/L (ref 3.5–5.1)
Sodium: 138 mmol/L (ref 135–145)

## 2018-10-07 MED ORDER — TORSEMIDE 20 MG PO TABS: 40.0000 mg | ORAL_TABLET | Freq: Every day | ORAL | 5 refills | Status: DC

## 2018-10-07 MED ORDER — TORSEMIDE 20 MG PO TABS: 40.0000 mg | ORAL_TABLET | ORAL | 5 refills | Status: DC

## 2018-10-07 MED ORDER — TORSEMIDE 20 MG PO TABS: 20.0000 mg | ORAL_TABLET | Freq: Every day | ORAL | 5 refills | Status: DC

## 2018-10-07 NOTE — Progress Notes (Signed)
SUBJECTIVE: The patient presents for routine follow-up.  She was hospitalized for decompensated systolic heart failure in July.  She had a follow-up office visit on 08/29/2018.  She was found to have acute on chronic renal insufficiency and diuretics were held for 2 days.  She was noted to be eating high amounts of sodium with fast food consumption.  Echocardiogram on 06/28/2017 showed severely reduced left ventricular systolic function, LVEF 123456. There was mild aortic and moderate mitral and tricuspid regurgitation with severe pulmonary hypertension.  This demonstrated a significant decline in LVEF from echocardiogram on 06/25/2016 when LVEF was 40 to 45%.  I ordered a stress test at a prior visit but was not pursued as she had cataract surgery.  She is here with her daughter.  The patient denies chest pain and tightness.  She says chronic exertional dyspnea is stable.  She also feels her leg swelling is at baseline.  She is trying to avoid high sodium foods.  She has occasional weakness and was told by her family members that she needs to eat more.    Review of Systems: As per "subjective", otherwise negative.  No Known Allergies  Current Outpatient Medications  Medication Sig Dispense Refill  . acetaminophen (TYLENOL) 325 MG tablet Take 2 tablets (650 mg total) by mouth every 6 (six) hours as needed for mild pain or fever (or Fever >/= 101). 15 tablet 1  . aspirin EC 81 MG tablet Take 81 mg by mouth daily.    . carvedilol (COREG) 12.5 MG tablet Take 1 tablet (12.5 mg total) by mouth 2 (two) times daily. 60 tablet 2  . Cholecalciferol (VITAMIN D PO) Take 1 tablet by mouth daily.    . COMBIGAN 0.2-0.5 % ophthalmic solution Place 1 drop into both eyes 2 (two) times daily.  6  . EAR DROPS 6.5 % OTIC solution     . FEROSUL 325 (65 Fe) MG tablet Take 1 tablet by mouth 2 (two) times daily.  11  . glimepiride (AMARYL) 1 MG tablet Take 1 mg by mouth daily with breakfast.    . LORazepam  (ATIVAN) 0.5 MG tablet Take 1 tablet by mouth as needed.  5  . Potassium Chloride ER 20 MEQ TBCR Take 20 mEq by mouth See admin instructions. Take 2 tablets (40 meq)  every M/W/F, take 1 tablet (20 Meq) every T/T/S/S 50 tablet 5  . predniSONE (DELTASONE) 10 MG tablet     . torsemide (DEMADEX) 20 MG tablet Take 1 tablet (20 mg total) by mouth See admin instructions. --- Take 2 tablets (40 mg) every M/W/F, take 1 tablet (20 MG) every T/T/S/S 50 tablet 5  . traMADol (ULTRAM) 50 MG tablet Take 1 tablet (50 mg total) by mouth every 6 (six) hours as needed for severe pain. 12 tablet 0  . TRAVATAN Z 0.004 % SOLN ophthalmic solution Place 1 drop into both eyes at bedtime.  6  . warfarin (COUMADIN) 5 MG tablet Take 1 tablet daily except 1/2 tablet on Fridays (Patient taking differently: Take 5mg  on Monday and Thursday, then 2.5mg  all other days) 90 tablet 3   No current facility-administered medications for this visit.     Past Medical History:  Diagnosis Date  . Anxiety   . Atrial fibrillation ([place])   . Chronic systolic CHF (congestive heart failure) ([place])   . Diabetes mellitus without complication ([place])   . Hypertension     Past Surgical History:  Procedure Laterality Date  .  BACK SURGERY    . EYE SURGERY Left     Social History   Socioeconomic History  . Marital status: Widowed    Spouse name: Not on file  . Number of children: Not on file  . Years of education: Not on file  . Highest education level: Not on file  Occupational History  . Not on file  Social Needs  . Financial resource strain: Patient refused  . Food insecurity    Worry: Patient refused    Inability: Patient refused  . Transportation needs    Medical: Patient refused    Non-medical: Patient refused  Tobacco Use  . Smoking status: Never Smoker  . Smokeless tobacco: Never Used  Substance and Sexual Activity  . Alcohol use: No  . Drug use: No  . Sexual activity: Not on file  Lifestyle  . Physical activity     Days per week: Patient refused    Minutes per session: Patient refused  . Stress: Patient refused  Relationships  . Social Herbalist on phone: Patient refused    Gets together: Patient refused    Attends religious service: Patient refused    Active member of club or organization: Patient refused    Attends meetings of clubs or organizations: Patient refused    Relationship status: Patient refused  . Intimate partner violence    Fear of current or ex partner: Patient refused    Emotionally abused: Patient refused    Physically abused: Patient refused    Forced sexual activity: Patient refused  Other Topics Concern  . Not on file  Social History Narrative   Brother-in-law lives with patient.      Vitals:   10/07/18 0855  BP: 104/64  Temp: 97.9 F (36.6 C)  SpO2: 96%  Weight: 202 lb (91.6 kg)  Height: 5\' 7"  (1.702 m)    Wt Readings from Last 3 Encounters:  10/07/18 202 lb (91.6 kg)  08/29/18 198 lb (89.8 kg)  08/06/18 199 lb 11.8 oz (90.6 kg)     PHYSICAL EXAM General: NAD HEENT: Normal. Neck: No JVD, no thyromegaly. Lungs: Clear to auscultation bilaterally with normal respiratory effort. CV: Regular rate and irregular rhythm, normal S1/S2, no S3, 2/6 systolic murmur along left sternal border.  Trace bilateral and chronic leg edema.   Abdomen: Soft, nontender, no distention.  Neurologic: Alert and oriented.  Psych: Normal affect. Skin: Normal. Musculoskeletal: No gross deformities.      Labs: Lab Results  Component Value Date/Time   K 4.7 08/29/2018 04:03 PM   BUN 72 (H) 08/29/2018 04:03 PM   CREATININE 2.58 (H) 08/29/2018 04:03 PM   CREATININE 1.76 (H) 02/03/2017 11:42 AM   ALT 11 08/03/2018 10:46 AM   TSH 1.932 01/27/2017 12:15 PM   HGB 9.5 (L) 08/05/2018 04:41 AM     Lipids: Lab Results  Component Value Date/Time   LDLCALC 94 02/14/2018 11:42 AM   CHOL 142 02/14/2018 11:42 AM   TRIG 65 02/14/2018 11:42 AM   HDL 35 (L) 02/14/2018  11:42 AM       ASSESSMENT AND PLAN:  1.  Chronic systolic heart failure: LVEF 25%.  She takes torsemide 40 mg daily.  Her PCP recently had her increase torsemide to 40 mg twice daily for 2 days.  I will check a basic metabolic panel.  Weight up 4 pounds from 08/29/2018.  Symptomatically stable.  She is on supplemental potassium.  2.  Persistent atrial fibrillation: Symptomatically stable.  Heart  rate is controlled on carvedilol 12.5 mg twice daily.  Anticoagulated with warfarin.  3.  Chronic kidney disease stage IV: BUN 72 and creatinine 2.58 on 08/29/2018 at the time of office visit.  I will check a basic metabolic panel.  4.  Hypertension: Blood pressure is normal.  No changes to therapy.  5.  Valvular heart disease: Echocardiogram reviewed above.  I will monitor clinically and with surveillance echocardiography.   Disposition: Follow up 3 months   Kate Sable, M.D., F.A.C.C.

## 2018-10-07 NOTE — Telephone Encounter (Signed)
I spoke with daughter,Sharon, she will reduce Torsemide to 20 mg daily and repeat bmet in 2 weeks.She will also call Dr.Befekadu to schedule apt.I mailed lab slip to her house

## 2018-10-07 NOTE — Patient Instructions (Addendum)
Medication Instructions: Your physician recommends that you continue on your current medications as directed. Please refer to the Current Medication list given to you today.   Labwork: BMET today  Procedures/Testing: None today  Follow-Up: 3 months with Dr.Koneswaran  Any Additional Special Instructions Will Be Listed Below (If Applicable).  Call Dr.Befekadu  For an apt [phone]   If you need a refill on your cardiac medications before your next appointment, please call your pharmacy.      Thank you for choosing [facility] !

## 2018-10-07 NOTE — Telephone Encounter (Signed)
-----   Message from Herminio Commons, MD sent at 10/07/2018 12:01 PM EDT ----- BUN elevated from prior. Reduce torsemide to 20 mg daily. Needs to see nephrology soon. Repeat BMET in 2 weeks.

## 2018-10-10 ENCOUNTER — Ambulatory Visit (INDEPENDENT_AMBULATORY_CARE_PROVIDER_SITE_OTHER): Payer: Medicare Other | Admitting: *Deleted

## 2018-10-10 DIAGNOSIS — I4891 Unspecified atrial fibrillation: Secondary | ICD-10-CM

## 2018-10-10 DIAGNOSIS — Z5181 Encounter for therapeutic drug level monitoring: Secondary | ICD-10-CM

## 2018-10-10 LAB — POCT INR: INR: 3.4 — AB (ref 2.0–3.0)

## 2018-10-10 NOTE — Patient Instructions (Signed)
Hold warfarin tonight then resume 1/2 tablet daily except 1 tablet on Mondays and Thursdays Recheck in 5 weeks

## 2018-10-14 ENCOUNTER — Telehealth: Payer: Self-pay | Admitting: Cardiovascular Disease

## 2018-10-14 NOTE — Telephone Encounter (Signed)
Daughter calling, pt does not need referral as she is patient of Dr.Befakadu already.She will call to make apt

## 2018-10-14 NOTE — Telephone Encounter (Signed)
Patient states that she was told to see kidney doctor by SK at [facility] on 9/11. There is no referral in. Please advise. / tg

## 2018-10-25 ENCOUNTER — Encounter (INDEPENDENT_AMBULATORY_CARE_PROVIDER_SITE_OTHER): Payer: Medicare Other | Admitting: Ophthalmology

## 2018-11-01 ENCOUNTER — Other Ambulatory Visit (HOSPITAL_COMMUNITY)
Admission: RE | Admit: 2018-11-01 | Discharge: 2018-11-01 | Disposition: A | Discharge status: Home / Self Care | Payer: Medicare Other | Source: Ambulatory Visit | Attending: Cardiovascular Disease | Admitting: Cardiovascular Disease

## 2018-11-01 DIAGNOSIS — N184 Chronic kidney disease, stage 4 (severe): Secondary | ICD-10-CM | POA: Diagnosis present

## 2018-11-01 LAB — BASIC METABOLIC PANEL
Anion gap: 7 (ref 5–15)
BUN: 53 mg/dL — ABNORMAL HIGH (ref 8–23)
CO2: 24 mmol/L (ref 22–32)
Calcium: 8.5 mg/dL — ABNORMAL LOW (ref 8.9–10.3)
Chloride: 107 mmol/L (ref 98–111)
Creatinine, Ser: 2.21 mg/dL — ABNORMAL HIGH (ref 0.44–1.00)
GFR calc Af Amer: 23 mL/min — ABNORMAL LOW (ref >60)
GFR calc non Af Amer: 20 mL/min — ABNORMAL LOW (ref >60)
Glucose, Bld: 198 mg/dL — ABNORMAL HIGH (ref 70–99)
Potassium: 4.3 mmol/L (ref 3.5–5.1)
Sodium: 138 mmol/L (ref 135–145)

## 2018-11-14 ENCOUNTER — Ambulatory Visit (INDEPENDENT_AMBULATORY_CARE_PROVIDER_SITE_OTHER): Payer: Medicare Other | Admitting: *Deleted

## 2018-11-14 ENCOUNTER — Telehealth: Payer: Self-pay | Admitting: Pharmacist

## 2018-11-14 ENCOUNTER — Other Ambulatory Visit: Payer: Self-pay

## 2018-11-14 DIAGNOSIS — I4891 Unspecified atrial fibrillation: Secondary | ICD-10-CM | POA: Diagnosis not present

## 2018-11-14 DIAGNOSIS — Z5181 Encounter for therapeutic drug level monitoring: Secondary | ICD-10-CM | POA: Diagnosis not present

## 2018-11-14 LAB — POCT INR: INR: 2.9 (ref 2.0–3.0)

## 2018-11-14 NOTE — Patient Instructions (Signed)
Continue warfarin 1/2 tablet daily except 1 tablet on Mondays and Thursdays Recheck in 5 weeks

## 2018-11-14 NOTE — Telephone Encounter (Signed)
Called pt to discuss changing from warfarin to [place] therapy due to better efficacy and safety profile, as well as decreased monitoring especially in setting of COVID-19 pandemic.   Pt is interested in changing to Eliquis, discussed benefits compared to warfarin however pt would like a refresher when she comes in today as well. She qualifies for Eliquis 2.5mg  BID due to age > 80 and SCr > 1.5. She has Medicaid insurance - 3 month supply will cost $8.95. Will plan to change pt at today's INR check.

## 2018-11-21 ENCOUNTER — Telehealth: Payer: Self-pay

## 2018-11-21 NOTE — Telephone Encounter (Signed)
Called pt to discuss changing from warfarin to [place] due to better efficacy and safety data, as well as less frequent monitoring, especially given COVID-19 pandemic.  With previous discussion, patient was agreeable but wanted to discuss with kidney doctor. She had an appointment on 10/23 andwas going to discuss with him then. When contacted today, patient seemed confused about what was decided, but thinks the doctor said not at this time. Would like for [place] to contact Glenard Haring, who took her to the appointment with the doctor, to verify (she works 3rd shift and is available in late afternoons). Other daughter may know but did not go to the appointment [phone]).   LVM for Glenard Haring to return call to pharmacy line.

## 2018-12-02 ENCOUNTER — Encounter (INDEPENDENT_AMBULATORY_CARE_PROVIDER_SITE_OTHER): Payer: Medicare Other | Admitting: Ophthalmology

## 2018-12-02 DIAGNOSIS — H43813 Vitreous degeneration, bilateral: Secondary | ICD-10-CM

## 2018-12-02 DIAGNOSIS — H35033 Hypertensive retinopathy, bilateral: Secondary | ICD-10-CM

## 2018-12-02 DIAGNOSIS — E11311 Type 2 diabetes mellitus with unspecified diabetic retinopathy with macular edema: Secondary | ICD-10-CM

## 2018-12-02 DIAGNOSIS — E113513 Type 2 diabetes mellitus with proliferative diabetic retinopathy with macular edema, bilateral: Secondary | ICD-10-CM

## 2018-12-02 DIAGNOSIS — I10 Essential (primary) hypertension: Secondary | ICD-10-CM | POA: Diagnosis not present

## 2018-12-09 NOTE — Telephone Encounter (Signed)
Called daughter, Glenard Haring, who stated that nephrologist preferred pt to stay on warfarin due to her renal function.

## 2018-12-19 ENCOUNTER — Ambulatory Visit (INDEPENDENT_AMBULATORY_CARE_PROVIDER_SITE_OTHER): Payer: Medicare Other | Admitting: *Deleted

## 2018-12-19 ENCOUNTER — Other Ambulatory Visit: Payer: Self-pay

## 2018-12-19 DIAGNOSIS — Z5181 Encounter for therapeutic drug level monitoring: Secondary | ICD-10-CM | POA: Diagnosis not present

## 2018-12-19 DIAGNOSIS — I4891 Unspecified atrial fibrillation: Secondary | ICD-10-CM | POA: Diagnosis not present

## 2018-12-19 LAB — POCT INR: INR: 2.1 (ref 2.0–3.0)

## 2018-12-19 NOTE — Patient Instructions (Signed)
Continue warfarin 1/2 tablet daily except 1 tablet on Mondays and Thursdays °Recheck in 6 weeks °

## 2019-01-12 ENCOUNTER — Encounter: Payer: Self-pay | Admitting: Cardiovascular Disease

## 2019-01-12 ENCOUNTER — Ambulatory Visit (INDEPENDENT_AMBULATORY_CARE_PROVIDER_SITE_OTHER): Payer: Medicare Other | Admitting: Cardiovascular Disease

## 2019-01-12 ENCOUNTER — Other Ambulatory Visit: Payer: Self-pay

## 2019-01-12 VITALS — BP 120/71 | Temp 97.1°F | Ht 66.0 in | Wt 191.0 lb

## 2019-01-12 DIAGNOSIS — I4819 Other persistent atrial fibrillation: Secondary | ICD-10-CM | POA: Diagnosis not present

## 2019-01-12 DIAGNOSIS — I38 Endocarditis, valve unspecified: Secondary | ICD-10-CM | POA: Diagnosis not present

## 2019-01-12 DIAGNOSIS — I5022 Chronic systolic (congestive) heart failure: Secondary | ICD-10-CM | POA: Diagnosis not present

## 2019-01-12 DIAGNOSIS — I10 Essential (primary) hypertension: Secondary | ICD-10-CM

## 2019-01-12 DIAGNOSIS — N184 Chronic kidney disease, stage 4 (severe): Secondary | ICD-10-CM | POA: Diagnosis not present

## 2019-01-12 NOTE — Patient Instructions (Signed)
Medication Instructions:  Your physician recommends that you continue on your current medications as directed. Please refer to the Current Medication list given to you today.  *If you need a refill on your cardiac medications before your next appointment, please call your pharmacy*  Lab Work: None today  If you have labs (blood work) drawn today and your tests are completely normal, you will receive your results only by: Marland Kitchen MyChart Message (if you have MyChart) OR . A paper copy in the mail If you have any lab test that is abnormal or we need to change your treatment, we will call you to review the results.  Testing/Procedures: None today   Follow-Up: At [facility], you and your health needs are our priority.  As part of our continuing mission to provide you with exceptional heart care, we have created designated Provider Care Teams.  These Care Teams include your primary Cardiologist (physician) and Advanced Practice Providers (APPs -  Physician Assistants and Nurse Practitioners) who all work together to provide you with the care you need, when you need it.  Your next appointment:   3 month(s)  The format for your next appointment:   Virtual Visit   Provider:   Kate Sable, MD  Other Instructions None     Thank you for choosing [facility] !

## 2019-01-12 NOTE — Progress Notes (Signed)
SUBJECTIVE: The patient presents for follow-up of chronic systolic heart failure.  Echocardiogram on 06/28/2017 showed severely reduced left ventricular systolic function, LVEF 123456. There was mild aortic and moderate mitral and tricuspid regurgitation with severe pulmonary hypertension.  This demonstrated a significant decline in LVEF from echocardiogram on 06/25/2016 when LVEF was 40 to 45%.  I ordered a stress test at a prior visit but was not pursued.  She is here with her daughter, Ivin Booty.  Her other daughter, Glenard Haring, is a Company secretary and also comes to her appointments.  Chronic exertional dyspnea is stable.  She denies chest pain.  Chronic lower extremity edema is stable.  She recently saw her nephrologist to double the dosage of torsemide and is scheduled to follow-up with him tomorrow.    Review of Systems: As per "subjective", otherwise negative.  No Known Allergies  Current Outpatient Medications  Medication Sig Dispense Refill  . acetaminophen (TYLENOL) 325 MG tablet Take 2 tablets (650 mg total) by mouth every 6 (six) hours as needed for mild pain or fever (or Fever >/= 101). 15 tablet 1  . aspirin EC 81 MG tablet Take 81 mg by mouth daily.    . carvedilol (COREG) 12.5 MG tablet Take 1 tablet (12.5 mg total) by mouth 2 (two) times daily. 60 tablet 2  . Cholecalciferol (VITAMIN D PO) Take 1 tablet by mouth daily.    . COMBIGAN 0.2-0.5 % ophthalmic solution Place 1 drop into both eyes 2 (two) times daily.  6  . EAR DROPS 6.5 % OTIC solution     . FEROSUL 325 (65 Fe) MG tablet Take 1 tablet by mouth 2 (two) times daily.  11  . glimepiride (AMARYL) 1 MG tablet Take 1 mg by mouth daily with breakfast.    . LORazepam (ATIVAN) 0.5 MG tablet Take 1 tablet by mouth as needed.  5  . Potassium Chloride ER 20 MEQ TBCR Take 20 mEq by mouth See admin instructions. Take 2 tablets (40 meq)  every M/W/F, take 1 tablet (20 Meq) every T/T/S/S 50 tablet 5  . predniSONE (DELTASONE) 10 MG  tablet     . torsemide (DEMADEX) 20 MG tablet Take 1 tablet (20 mg total) by mouth daily. 180 tablet 5  . traMADol (ULTRAM) 50 MG tablet Take 1 tablet (50 mg total) by mouth every 6 (six) hours as needed for severe pain. 12 tablet 0  . TRAVATAN Z 0.004 % SOLN ophthalmic solution Place 1 drop into both eyes at bedtime.  6  . warfarin (COUMADIN) 5 MG tablet Take 1 tablet daily except 1/2 tablet on Fridays (Patient taking differently: Take 5mg  on Monday and Thursday, then 2.5mg  all other days) 90 tablet 3   No current facility-administered medications for this visit.    Past Medical History:  Diagnosis Date  . Anxiety   . Atrial fibrillation ([place])   . Chronic systolic CHF (congestive heart failure) ([place])   . Diabetes mellitus without complication ([place])   . Hypertension     Past Surgical History:  Procedure Laterality Date  . BACK SURGERY    . EYE SURGERY Left     Social History   Socioeconomic History  . Marital status: Widowed    Spouse name: Not on file  . Number of children: Not on file  . Years of education: Not on file  . Highest education level: Not on file  Occupational History  . Not on file  Tobacco Use  . Smoking status:  Never Smoker  . Smokeless tobacco: Never Used  Substance and Sexual Activity  . Alcohol use: No  . Drug use: No  . Sexual activity: Not on file  Other Topics Concern  . Not on file  Social History Narrative   Brother-in-law lives with patient.    Social Determinants of Health   Financial Resource Strain: Unknown  . Difficulty of Paying Living Expenses: Patient refused  Food Insecurity: Unknown  . Worried About Charity fundraiser in the Last Year: Patient refused  . Ran Out of Food in the Last Year: Patient refused  Transportation Needs: Unknown  . Lack of Transportation (Medical): Patient refused  . Lack of Transportation (Non-Medical): Patient refused  Physical Activity: Unknown  . Days of Exercise per Week: Patient refused  .  Minutes of Exercise per Session: Patient refused  Stress: Unknown  . Feeling of Stress : Patient refused  Social Connections: Unknown  . Frequency of Communication with Friends and Family: Patient refused  . Frequency of Social Gatherings with Friends and Family: Patient refused  . Attends Religious Services: Patient refused  . Active Member of Clubs or Organizations: Patient refused  . Attends Archivist Meetings: Patient refused  . Marital Status: Patient refused  Intimate Partner Violence: Unknown  . Fear of Current or Ex-Partner: Patient refused  . Emotionally Abused: Patient refused  . Physically Abused: Patient refused  . Sexually Abused: Patient refused     Vitals:   01/12/19 0917  BP: 120/71  Temp: (!) 97.1 F (36.2 C)  SpO2: 97%  Weight: 191 lb (86.6 kg)  Height: 5\' 6"  (1.676 m)    Wt Readings from Last 3 Encounters:  01/12/19 191 lb (86.6 kg)  10/07/18 202 lb (91.6 kg)  08/29/18 198 lb (89.8 kg)     PHYSICAL EXAM General: NAD HEENT: Normal. Neck: No JVD, no thyromegaly. Lungs: Clear to auscultation bilaterally with normal respiratory effort. CV: Regular rate and irregular rhythm, normal S1/S2, no S3, 2/6 systolic murmur along left sternal border.    Chronic bilateral lymphedema.   Abdomen: Soft, nontender, no distention.  Neurologic: Alert and oriented.  Psych: Normal affect. Skin: Normal. Musculoskeletal: No gross deformities.      Labs: Lab Results  Component Value Date/Time   K 4.3 11/01/2018 11:09 AM   BUN 53 (H) 11/01/2018 11:09 AM   CREATININE 2.21 (H) 11/01/2018 11:09 AM   CREATININE 1.76 (H) 02/03/2017 11:42 AM   ALT 11 08/03/2018 10:46 AM   TSH 1.932 01/27/2017 12:15 PM   HGB 9.5 (L) 08/05/2018 04:41 AM     Lipids: Lab Results  Component Value Date/Time   LDLCALC 94 02/14/2018 11:42 AM   CHOL 142 02/14/2018 11:42 AM   TRIG 65 02/14/2018 11:42 AM   HDL 35 (L) 02/14/2018 11:42 AM       ASSESSMENT AND PLAN: 1.   Chronic systolic heart failure: LVEF 25%.    Symptomatically stable.  Continue torsemide 20 mg daily with supplemental potassium.  Recent diuretic adjustments made by her nephrologist.  2.  Persistent atrial fibrillation: Symptomatically stable.  Heart rate is controlled on carvedilol 12.5 mg twice daily.  Anticoagulated with warfarin.  3.  Chronic kidney disease stage IV: BUN 53 and creatinine 2.21 on 11/01/2018.  Followed by nephrology.  4.  Hypertension: Blood pressure is normal.  No changes to therapy.  5.  Valvular heart disease: Echocardiogram reviewed above.  I will monitor clinically and with surveillance echocardiography.     Disposition: Follow  up 3 months virtual visit   Kate Sable, M.D., F.A.C.C.

## 2019-01-30 ENCOUNTER — Other Ambulatory Visit: Payer: Self-pay

## 2019-01-30 ENCOUNTER — Ambulatory Visit (INDEPENDENT_AMBULATORY_CARE_PROVIDER_SITE_OTHER): Payer: Medicare Other | Admitting: *Deleted

## 2019-01-30 DIAGNOSIS — Z5181 Encounter for therapeutic drug level monitoring: Secondary | ICD-10-CM

## 2019-01-30 DIAGNOSIS — I4891 Unspecified atrial fibrillation: Secondary | ICD-10-CM

## 2019-01-30 LAB — POCT INR: INR: 1.4 — AB (ref 2.0–3.0)

## 2019-01-30 NOTE — Patient Instructions (Addendum)
Take warfarin 1 tablet x 4 days then resume 1/2 tablet daily except 1 tablet on Mondays and Thursdays Recheck in 2 weeks

## 2019-02-17 ENCOUNTER — Other Ambulatory Visit (HOSPITAL_COMMUNITY): Payer: Self-pay | Admitting: Nurse Practitioner

## 2019-02-17 ENCOUNTER — Other Ambulatory Visit: Payer: Self-pay

## 2019-02-17 ENCOUNTER — Encounter (INDEPENDENT_AMBULATORY_CARE_PROVIDER_SITE_OTHER): Payer: Medicare Other | Admitting: Ophthalmology

## 2019-02-17 ENCOUNTER — Ambulatory Visit (HOSPITAL_COMMUNITY)
Admission: RE | Admit: 2019-02-17 | Discharge: 2019-02-17 | Disposition: A | Discharge status: Home / Self Care | Payer: Medicare Other | Source: Ambulatory Visit | Attending: Nurse Practitioner | Admitting: Nurse Practitioner

## 2019-02-17 ENCOUNTER — Other Ambulatory Visit (HOSPITAL_COMMUNITY)
Admission: RE | Admit: 2019-02-17 | Discharge: 2019-02-17 | Disposition: A | Discharge status: Home / Self Care | Payer: Medicare Other | Source: Ambulatory Visit | Attending: Nurse Practitioner | Admitting: Nurse Practitioner

## 2019-02-17 DIAGNOSIS — R69 Illness, unspecified: Secondary | ICD-10-CM | POA: Diagnosis present

## 2019-02-17 DIAGNOSIS — M79671 Pain in right foot: Secondary | ICD-10-CM

## 2019-02-17 LAB — URIC ACID: Uric Acid, Serum: 7.6 mg/dL — ABNORMAL HIGH (ref 2.5–7.1)

## 2019-02-20 ENCOUNTER — Encounter (INDEPENDENT_AMBULATORY_CARE_PROVIDER_SITE_OTHER): Payer: Medicare Other | Admitting: Ophthalmology

## 2019-02-20 ENCOUNTER — Ambulatory Visit (INDEPENDENT_AMBULATORY_CARE_PROVIDER_SITE_OTHER): Payer: Medicare Other | Admitting: *Deleted

## 2019-02-20 ENCOUNTER — Other Ambulatory Visit: Payer: Self-pay

## 2019-02-20 DIAGNOSIS — E113591 Type 2 diabetes mellitus with proliferative diabetic retinopathy without macular edema, right eye: Secondary | ICD-10-CM | POA: Diagnosis not present

## 2019-02-20 DIAGNOSIS — H35033 Hypertensive retinopathy, bilateral: Secondary | ICD-10-CM

## 2019-02-20 DIAGNOSIS — Z5181 Encounter for therapeutic drug level monitoring: Secondary | ICD-10-CM

## 2019-02-20 DIAGNOSIS — E11311 Type 2 diabetes mellitus with unspecified diabetic retinopathy with macular edema: Secondary | ICD-10-CM

## 2019-02-20 DIAGNOSIS — E113512 Type 2 diabetes mellitus with proliferative diabetic retinopathy with macular edema, left eye: Secondary | ICD-10-CM

## 2019-02-20 DIAGNOSIS — H43813 Vitreous degeneration, bilateral: Secondary | ICD-10-CM

## 2019-02-20 DIAGNOSIS — I10 Essential (primary) hypertension: Secondary | ICD-10-CM

## 2019-02-20 DIAGNOSIS — I4891 Unspecified atrial fibrillation: Secondary | ICD-10-CM

## 2019-02-20 LAB — POCT INR: INR: 2.3 (ref 2.0–3.0)

## 2019-02-20 NOTE — Patient Instructions (Signed)
Continue warfarin 1/2 tablet daily except 1 tablet on Mondays and Thursdays Recheck in 3 weeks

## 2019-02-22 ENCOUNTER — Encounter (INDEPENDENT_AMBULATORY_CARE_PROVIDER_SITE_OTHER): Payer: Medicare Other | Admitting: Ophthalmology

## 2019-03-23 ENCOUNTER — Other Ambulatory Visit: Payer: Self-pay

## 2019-03-23 ENCOUNTER — Ambulatory Visit (INDEPENDENT_AMBULATORY_CARE_PROVIDER_SITE_OTHER): Payer: Medicare Other | Admitting: *Deleted

## 2019-03-23 DIAGNOSIS — I4891 Unspecified atrial fibrillation: Secondary | ICD-10-CM

## 2019-03-23 DIAGNOSIS — Z5181 Encounter for therapeutic drug level monitoring: Secondary | ICD-10-CM | POA: Diagnosis not present

## 2019-03-23 LAB — POCT INR: INR: 2.7 (ref 2.0–3.0)

## 2019-03-23 NOTE — Patient Instructions (Signed)
Continue warfarin 1/2 tablet daily except 1 tablet on Mondays and Thursdays  Recheck in 4 weeks 

## 2019-03-28 IMAGING — DX DG HAND COMPLETE 3+V*L*
3 series · 3 of 3 positions shown · non-contrast
Comparison: None.

CLINICAL DATA: Cellulitis of left hand, left index finger
discolored and swollen and proximal interphalangeal joint swollen.

EXAM:
LEFT HAND - COMPLETE 3+ VIEW

[hand pa]
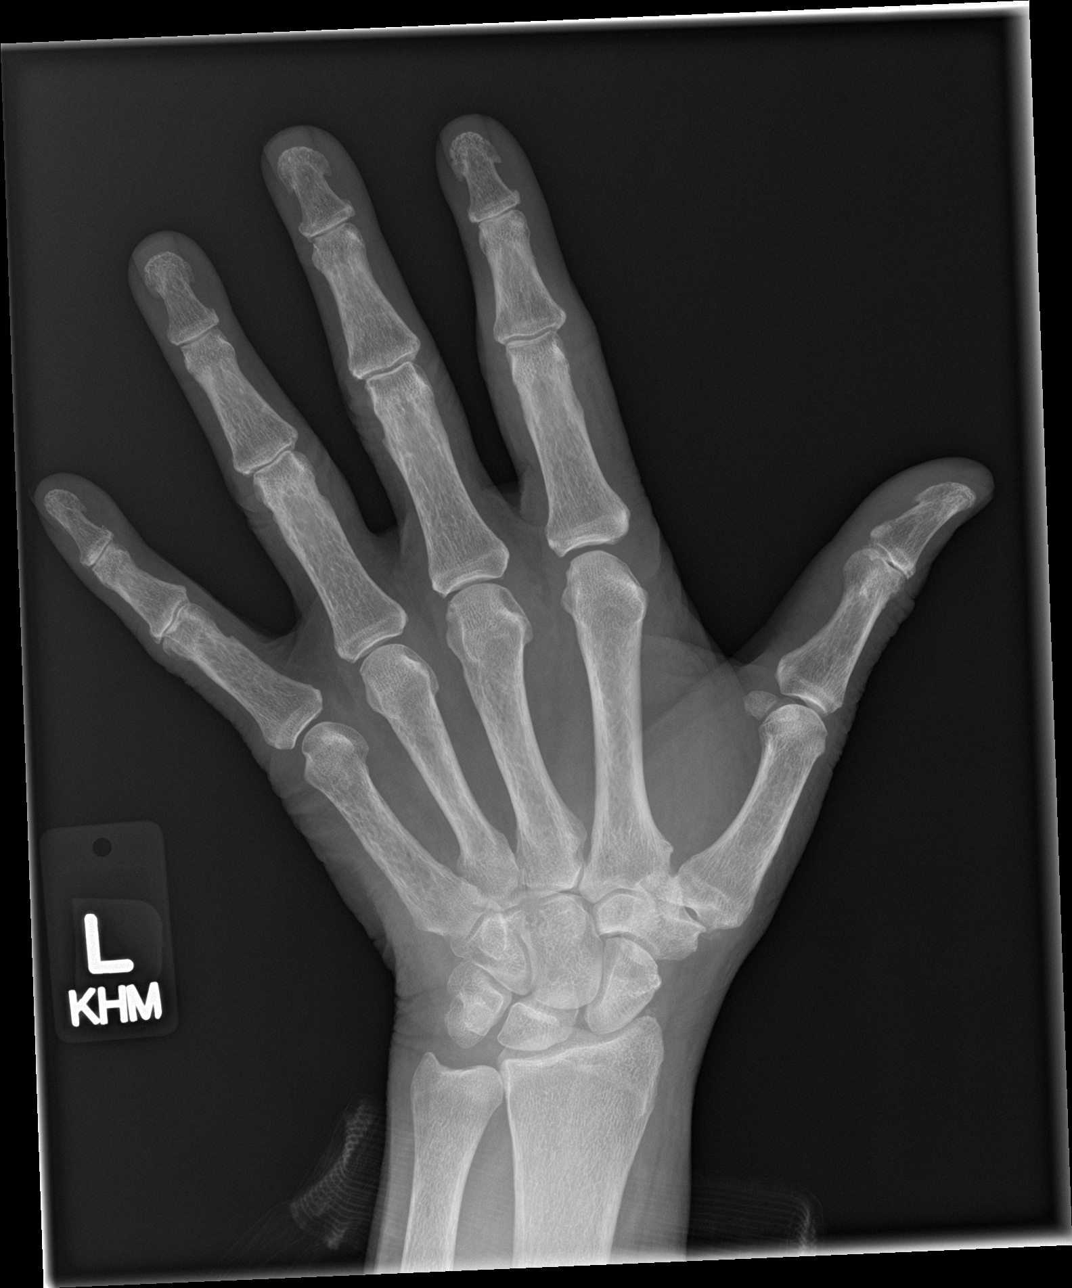

[hand obl]
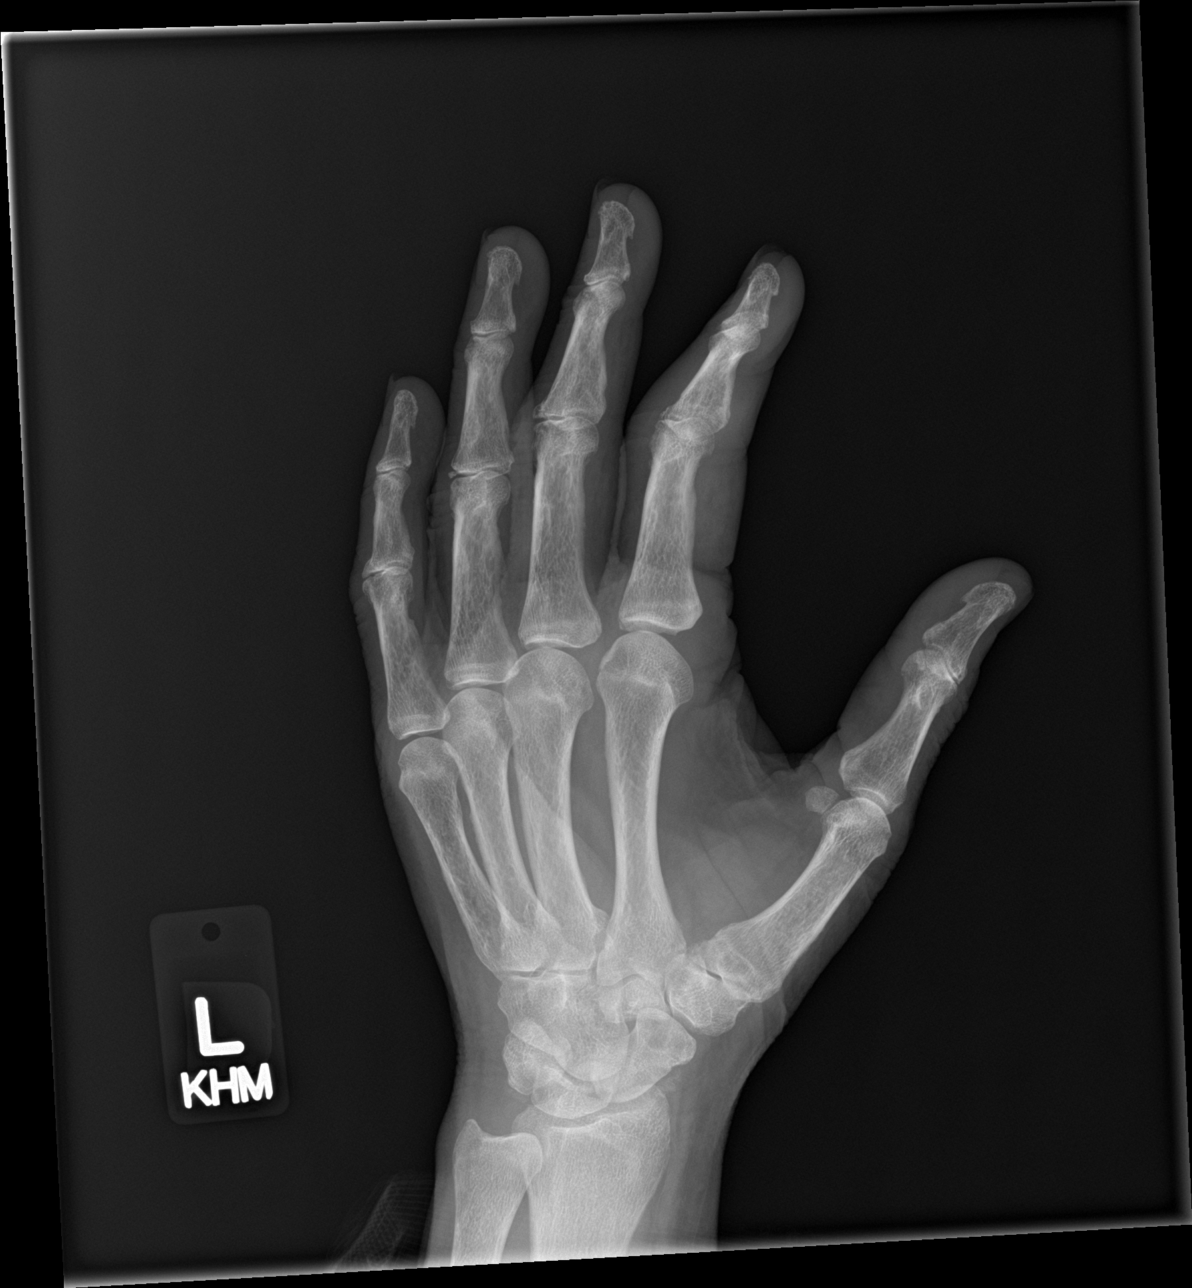

[hand lat]
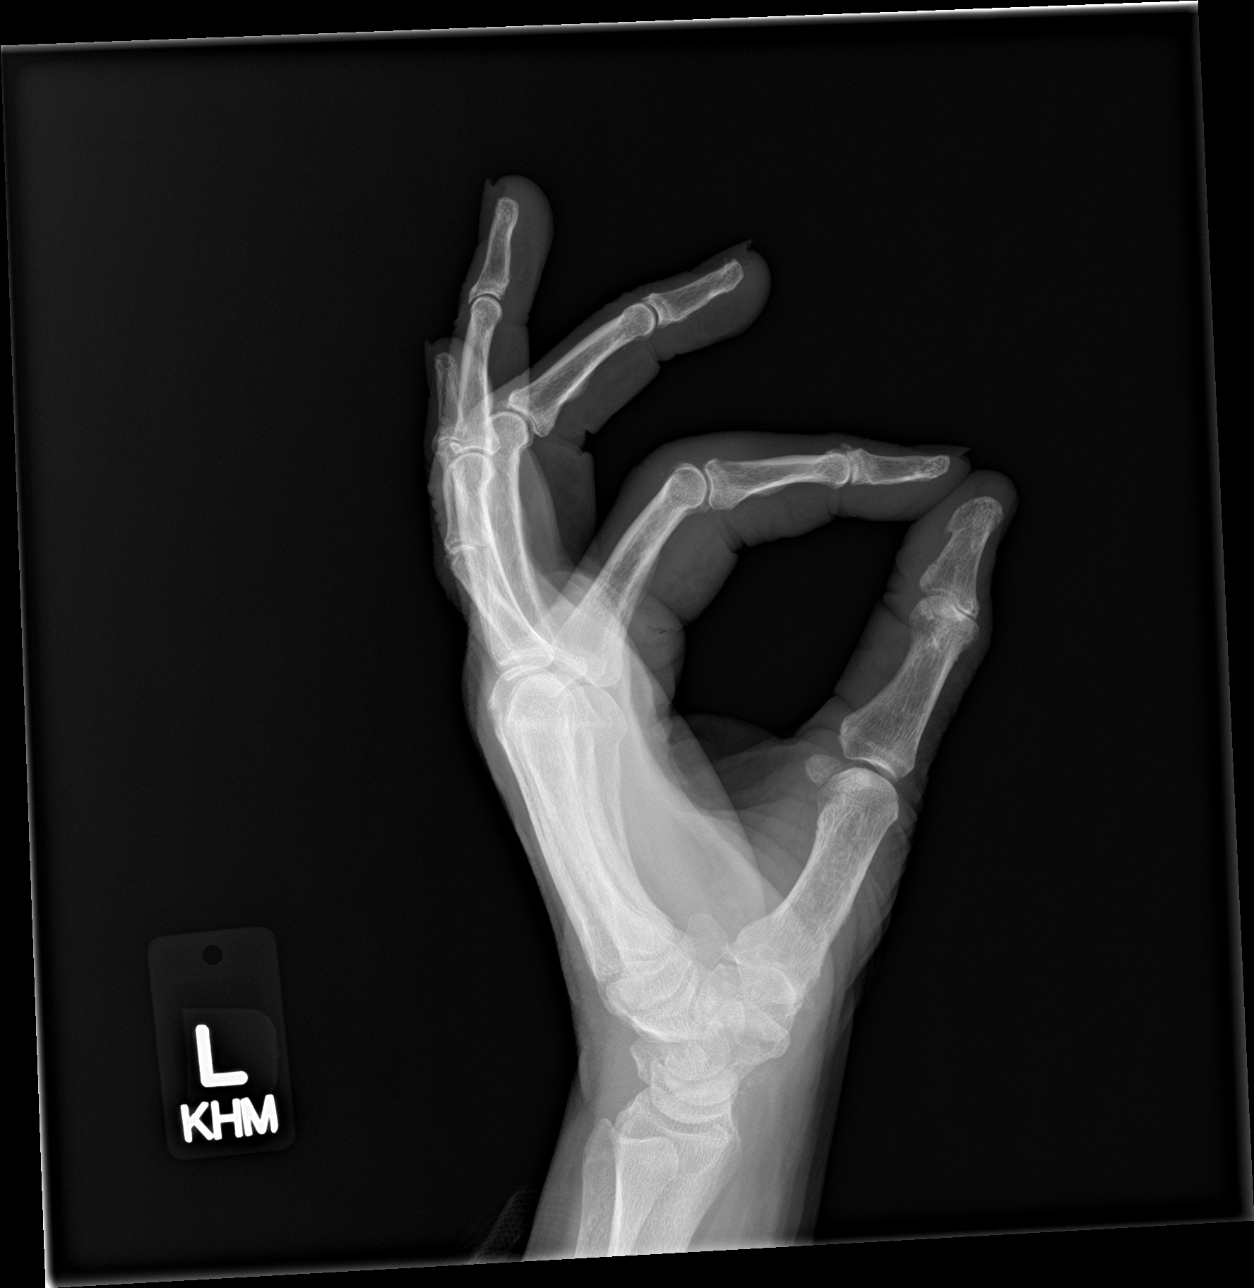

[3 of 3 positions shown; findings below may reference images not displayed]

FINDINGS: No acute fracture or dislocation. Mild generalized osteopenia. Mild
osteoarthritis of the first CMC joint. Mild osteoarthritis of the
first IP joint. Mild osteoarthritis of the third PIP joint. No soft
tissue abnormality.
IMPRESSION: 1.  No acute osseous injury of the left hand.

## 2019-04-12 ENCOUNTER — Telehealth: Payer: Medicare Other | Admitting: Cardiovascular Disease

## 2019-04-20 ENCOUNTER — Other Ambulatory Visit: Payer: Self-pay

## 2019-04-20 ENCOUNTER — Ambulatory Visit (INDEPENDENT_AMBULATORY_CARE_PROVIDER_SITE_OTHER): Payer: Medicare Other | Admitting: *Deleted

## 2019-04-20 DIAGNOSIS — I4891 Unspecified atrial fibrillation: Secondary | ICD-10-CM

## 2019-04-20 DIAGNOSIS — Z5181 Encounter for therapeutic drug level monitoring: Secondary | ICD-10-CM | POA: Diagnosis not present

## 2019-04-20 LAB — POCT INR: INR: 2.1 (ref 2.0–3.0)

## 2019-04-20 NOTE — Patient Instructions (Signed)
Continue warfarin 1/2 tablet daily except 1 tablet on Mondays and Thursdays Recheck in 6 weeks

## 2019-05-11 ENCOUNTER — Telehealth: Payer: Medicare Other | Admitting: Cardiovascular Disease

## 2019-05-15 ENCOUNTER — Encounter (INDEPENDENT_AMBULATORY_CARE_PROVIDER_SITE_OTHER): Payer: Medicare Other | Admitting: Ophthalmology

## 2019-05-16 ENCOUNTER — Encounter: Payer: Self-pay | Admitting: Cardiovascular Disease

## 2019-05-16 ENCOUNTER — Telehealth (INDEPENDENT_AMBULATORY_CARE_PROVIDER_SITE_OTHER): Payer: Medicare Other | Admitting: Cardiovascular Disease

## 2019-05-16 DIAGNOSIS — N184 Chronic kidney disease, stage 4 (severe): Secondary | ICD-10-CM

## 2019-05-16 DIAGNOSIS — I4819 Other persistent atrial fibrillation: Secondary | ICD-10-CM | POA: Diagnosis not present

## 2019-05-16 DIAGNOSIS — I5022 Chronic systolic (congestive) heart failure: Secondary | ICD-10-CM | POA: Diagnosis not present

## 2019-05-16 DIAGNOSIS — I4891 Unspecified atrial fibrillation: Secondary | ICD-10-CM

## 2019-05-16 DIAGNOSIS — I38 Endocarditis, valve unspecified: Secondary | ICD-10-CM

## 2019-05-16 DIAGNOSIS — Z5181 Encounter for therapeutic drug level monitoring: Secondary | ICD-10-CM

## 2019-05-16 DIAGNOSIS — I10 Essential (primary) hypertension: Secondary | ICD-10-CM

## 2019-05-16 NOTE — Progress Notes (Signed)
Virtual Visit via Telephone Note   This visit type was conducted due to national recommendations for restrictions regarding the COVID-19 Pandemic (e.g. social distancing) in an effort to limit this patient's exposure and mitigate transmission in our community.  Due to her co-morbid illnesses, this patient is at least at moderate risk for complications without adequate follow up.  This format is felt to be most appropriate for this patient at this time.  The patient did not have access to video technology/had technical difficulties with video requiring transitioning to audio format only (telephone).  All issues noted in this document were discussed and addressed.  No physical exam could be performed with this format.  Please refer to the patient's chart for her  consent to telehealth for [facility].   The patient was identified using 2 identifiers.  Date:  05/16/2019   ID:  Barbara Stone, DOB 03/03/1935, MRN [id number]  Patient Location: Home Provider Location: Office  PCP:  Lemmie Evens, MD  Cardiologist:  Kate Sable, MD  Electrophysiologist:  None   Evaluation Performed:  Follow-Up Visit  Chief Complaint:  CHF  History of Present Illness:    Barbara Stone is a 84 y.o. female with chronic systolic heart failure and persistent atrial fibrillation.  Echocardiogram on 06/28/2017 showed severely reduced left ventricular systolic function, LVEF 66%. There was mild aortic and moderate mitral and tricuspid regurgitation with severe pulmonary hypertension.  This demonstrated a significant decline in LVEF from echocardiogram on 06/25/2016 when LVEF was 40 to 45%.  I ordered a stress test at a prior visit but was not pursued.  I spoke with her daughter, Ivin Booty. Her other daughter, Glenard Haring, is a Company secretary and also comes to her appointments.  She denies shortness of breath, leg swelling, and chest pain. Ivin Booty says her mother's weight is stable.  She also follows with nephrology.    Past Medical History:  Diagnosis Date  . Anxiety   . Atrial fibrillation ([place])   . Chronic systolic CHF (congestive heart failure) ([place])   . Diabetes mellitus without complication ([place])   . Hypertension    Past Surgical History:  Procedure Laterality Date  . BACK SURGERY    . EYE SURGERY Left      Current Meds  Medication Sig  . acetaminophen (TYLENOL) 325 MG tablet Take 2 tablets (650 mg total) by mouth every 6 (six) hours as needed for mild pain or fever (or Fever >/= 101).  Marland Kitchen aspirin EC 81 MG tablet Take 81 mg by mouth daily.  . carvedilol (COREG) 12.5 MG tablet Take 1 tablet (12.5 mg total) by mouth 2 (two) times daily.  . Cholecalciferol (VITAMIN D PO) Take 1 tablet by mouth daily.  . COMBIGAN 0.2-0.5 % ophthalmic solution Place 1 drop into both eyes 2 (two) times daily.  Marland Kitchen EAR DROPS 6.5 % OTIC solution   . FEROSUL 325 (65 Fe) MG tablet Take 1 tablet by mouth 2 (two) times daily.  Marland Kitchen glimepiride (AMARYL) 1 MG tablet Take 1 mg by mouth daily with breakfast.  . LORazepam (ATIVAN) 0.5 MG tablet Take 1 tablet by mouth as needed.  . Potassium Chloride ER 20 MEQ TBCR Take 20 mEq by mouth See admin instructions. Take 2 tablets (40 meq)  every M/W/F, take 1 tablet (20 Meq) every T/T/S/S  . predniSONE (DELTASONE) 10 MG tablet   . torsemide (DEMADEX) 20 MG tablet Take 1 tablet (20 mg total) by mouth daily.  . traMADol (ULTRAM) 50 MG tablet Take  1 tablet (50 mg total) by mouth every 6 (six) hours as needed for severe pain.  . TRAVATAN Z 0.004 % SOLN ophthalmic solution Place 1 drop into both eyes at bedtime.  Marland Kitchen warfarin (COUMADIN) 5 MG tablet Take 1 tablet daily except 1/2 tablet on Fridays (Patient taking differently: Take 5mg  on Monday and Thursday, then 2.5mg  all other days)     Allergies:   Patient has no known allergies.   Social History   Tobacco Use  . Smoking status: Never Smoker  . Smokeless tobacco: Never Used  Substance Use Topics  . Alcohol use: No  . Drug use: No      Family Hx: The patient's family history is negative for Colon cancer.  ROS:   Please see the history of present illness.     All other systems reviewed and are negative.   Prior CV studies:   The following studies were reviewed today:  Reviewed above  Labs/Other Tests and Data Reviewed:    EKG:  No ECG reviewed.  Recent Labs: 08/03/2018: ALT 11; B Natriuretic Peptide 933.0 08/05/2018: Hemoglobin 9.5; Platelets 177 08/06/2018: Magnesium 2.2 11/01/2018: BUN 53; Creatinine, Ser 2.21; Potassium 4.3; Sodium 138   Recent Lipid Panel Lab Results  Component Value Date/Time   CHOL 142 02/14/2018 11:42 AM   TRIG 65 02/14/2018 11:42 AM   HDL 35 (L) 02/14/2018 11:42 AM   CHOLHDL 4.1 02/14/2018 11:42 AM   LDLCALC 94 02/14/2018 11:42 AM    Wt Readings from Last 3 Encounters:  01/12/19 191 lb (86.6 kg)  10/07/18 202 lb (91.6 kg)  08/29/18 198 lb (89.8 kg)     Objective:    Vital Signs:  There were no vitals taken for this visit.   VITAL SIGNS:  reviewed  ASSESSMENT & PLAN:    1. Chronic systolic heart failure: LVEF 25%.   Symptomatically stable.  Continue torsemide 20 mg twice daily (as prescribed by nephrology) with supplemental potassium.    2. Persistent atrial fibrillation: Symptomatically stable. Heart rate is controlled on carvedilol 12.5 mg twice daily. Anticoagulated with warfarin.  3. Chronic kidney disease stage IV: BUN 53 and creatinine 2.21 on 11/01/2018.  Followed by nephrology.  4. Hypertension: No changes to therapy.  5. Valvular heart disease: Echocardiogram reviewed above. I will monitor clinically and with surveillance echocardiography.   COVID-19 Education: The signs and symptoms of COVID-19 were discussed with the patient and how to seek care for testing (follow up with PCP or arrange E-visit).  The importance of social distancing was discussed today.  Time:   Today, I have spent 25 minutes with the patient with telehealth technology  discussing the above problems.     Medication Adjustments/Labs and Tests Ordered: Current medicines are reviewed at length with the patient today.  Concerns regarding medicines are outlined above.   Tests Ordered: No orders of the defined types were placed in this encounter.   Medication Changes: No orders of the defined types were placed in this encounter.   Follow Up:  In Person in 6 month(s)  Signed, Kate Sable, MD  05/16/2019 10:28 AM    [place]

## 2019-05-16 NOTE — Patient Instructions (Signed)
Medication Instructions: Your physician recommends that you continue on your current medications as directed. Please refer to the Current Medication list given to you today.   Labwork: None today  Procedures/Testing: None today  Follow-Up: 6 months office visit with Dr.Koneswaran  Any Additional Special Instructions Will Be Listed Below (If Applicable).     If you need a refill on your cardiac medications before your next appointment, please call your pharmacy.

## 2019-05-16 NOTE — Addendum Note (Signed)
Addended by: Barbarann Ehlers A on: 05/16/2019 11:10 AM   Modules accepted: Orders

## 2019-05-19 ENCOUNTER — Encounter (INDEPENDENT_AMBULATORY_CARE_PROVIDER_SITE_OTHER): Payer: Medicare Other | Admitting: Ophthalmology

## 2019-06-02 ENCOUNTER — Encounter (INDEPENDENT_AMBULATORY_CARE_PROVIDER_SITE_OTHER): Payer: Medicare Other | Admitting: Ophthalmology

## 2019-06-05 ENCOUNTER — Encounter (INDEPENDENT_AMBULATORY_CARE_PROVIDER_SITE_OTHER): Payer: Medicare Other | Admitting: Ophthalmology

## 2019-06-12 ENCOUNTER — Ambulatory Visit (INDEPENDENT_AMBULATORY_CARE_PROVIDER_SITE_OTHER): Payer: Medicare Other | Admitting: *Deleted

## 2019-06-12 ENCOUNTER — Other Ambulatory Visit: Payer: Self-pay

## 2019-06-12 DIAGNOSIS — Z5181 Encounter for therapeutic drug level monitoring: Secondary | ICD-10-CM

## 2019-06-12 DIAGNOSIS — I4891 Unspecified atrial fibrillation: Secondary | ICD-10-CM

## 2019-06-12 LAB — POCT INR: INR: 2.5 (ref 2.0–3.0)

## 2019-06-12 NOTE — Patient Instructions (Signed)
Continue warfarin 1/2 tablet daily except 1 tablet on Mondays and Thursdays Recheck in 6 weeks

## 2019-06-15 ENCOUNTER — Encounter (INDEPENDENT_AMBULATORY_CARE_PROVIDER_SITE_OTHER): Payer: Medicare Other | Admitting: Ophthalmology

## 2019-06-16 ENCOUNTER — Encounter (INDEPENDENT_AMBULATORY_CARE_PROVIDER_SITE_OTHER): Payer: Medicare Other | Admitting: Ophthalmology

## 2019-06-27 ENCOUNTER — Encounter (INDEPENDENT_AMBULATORY_CARE_PROVIDER_SITE_OTHER): Payer: Medicare Other | Admitting: Ophthalmology

## 2019-07-05 ENCOUNTER — Encounter (INDEPENDENT_AMBULATORY_CARE_PROVIDER_SITE_OTHER): Payer: Medicare Other | Admitting: Ophthalmology

## 2019-07-07 ENCOUNTER — Encounter (INDEPENDENT_AMBULATORY_CARE_PROVIDER_SITE_OTHER): Payer: Medicare Other | Admitting: Ophthalmology

## 2019-07-25 ENCOUNTER — Encounter (INDEPENDENT_AMBULATORY_CARE_PROVIDER_SITE_OTHER): Payer: Medicare Other | Admitting: Ophthalmology

## 2019-07-27 ENCOUNTER — Ambulatory Visit (INDEPENDENT_AMBULATORY_CARE_PROVIDER_SITE_OTHER): Payer: Medicare Other | Admitting: *Deleted

## 2019-07-27 ENCOUNTER — Other Ambulatory Visit: Payer: Self-pay

## 2019-07-27 DIAGNOSIS — I4891 Unspecified atrial fibrillation: Secondary | ICD-10-CM | POA: Diagnosis not present

## 2019-07-27 DIAGNOSIS — Z5181 Encounter for therapeutic drug level monitoring: Secondary | ICD-10-CM

## 2019-07-27 LAB — POCT INR: INR: 2.1 (ref 2.0–3.0)

## 2019-07-27 NOTE — Patient Instructions (Signed)
Continue warfarin 1/2 tablet daily except 1 tablet on Mondays and Thursdays Recheck in 6 weeks

## 2019-08-09 ENCOUNTER — Telehealth: Payer: Self-pay | Admitting: *Deleted

## 2019-08-09 ENCOUNTER — Ambulatory Visit (INDEPENDENT_AMBULATORY_CARE_PROVIDER_SITE_OTHER): Payer: Self-pay | Admitting: *Deleted

## 2019-08-09 MED ORDER — WARFARIN SODIUM 5 MG PO TABS: ORAL_TABLET | ORAL | 3 refills | Status: DC

## 2019-08-09 NOTE — Patient Instructions (Signed)
Opened this note in error.  Warfarin refill sent to [facility]

## 2019-08-09 NOTE — Telephone Encounter (Signed)
Pt is out of warfarin (COUMADIN) 5 MG tablet [[id number]]  And needing refills sent in

## 2019-08-24 ENCOUNTER — Other Ambulatory Visit: Payer: Self-pay

## 2019-08-24 ENCOUNTER — Encounter (INDEPENDENT_AMBULATORY_CARE_PROVIDER_SITE_OTHER): Payer: Medicare Other | Admitting: Ophthalmology

## 2019-08-24 DIAGNOSIS — E113591 Type 2 diabetes mellitus with proliferative diabetic retinopathy without macular edema, right eye: Secondary | ICD-10-CM

## 2019-08-24 DIAGNOSIS — E113512 Type 2 diabetes mellitus with proliferative diabetic retinopathy with macular edema, left eye: Secondary | ICD-10-CM | POA: Diagnosis not present

## 2019-08-24 DIAGNOSIS — E11311 Type 2 diabetes mellitus with unspecified diabetic retinopathy with macular edema: Secondary | ICD-10-CM

## 2019-08-24 DIAGNOSIS — I10 Essential (primary) hypertension: Secondary | ICD-10-CM | POA: Diagnosis not present

## 2019-08-24 DIAGNOSIS — H43813 Vitreous degeneration, bilateral: Secondary | ICD-10-CM

## 2019-08-24 DIAGNOSIS — H35033 Hypertensive retinopathy, bilateral: Secondary | ICD-10-CM

## 2019-10-27 ENCOUNTER — Ambulatory Visit: Payer: Medicare Other | Admitting: Cardiovascular Disease

## 2019-10-27 ENCOUNTER — Ambulatory Visit (INDEPENDENT_AMBULATORY_CARE_PROVIDER_SITE_OTHER): Payer: Medicare Other | Admitting: *Deleted

## 2019-10-27 DIAGNOSIS — Z5181 Encounter for therapeutic drug level monitoring: Secondary | ICD-10-CM

## 2019-10-27 DIAGNOSIS — I4891 Unspecified atrial fibrillation: Secondary | ICD-10-CM

## 2019-10-27 LAB — POCT INR: INR: 2.2 (ref 2.0–3.0)

## 2019-10-27 NOTE — Patient Instructions (Signed)
Continue warfarin 1/2 tablet daily except 1 tablet on Mondays and Thursdays Recheck in 6 weeks

## 2019-10-31 ENCOUNTER — Ambulatory Visit (INDEPENDENT_AMBULATORY_CARE_PROVIDER_SITE_OTHER): Payer: Medicare Other | Admitting: Student

## 2019-10-31 ENCOUNTER — Encounter: Payer: Self-pay | Admitting: Student

## 2019-10-31 ENCOUNTER — Other Ambulatory Visit: Payer: Self-pay

## 2019-10-31 VITALS — BP 100/70 | HR 89 | Ht 66.0 in | Wt 204.2 lb

## 2019-10-31 DIAGNOSIS — I4819 Other persistent atrial fibrillation: Secondary | ICD-10-CM | POA: Diagnosis not present

## 2019-10-31 DIAGNOSIS — I5022 Chronic systolic (congestive) heart failure: Secondary | ICD-10-CM

## 2019-10-31 DIAGNOSIS — I38 Endocarditis, valve unspecified: Secondary | ICD-10-CM | POA: Diagnosis not present

## 2019-10-31 DIAGNOSIS — I10 Essential (primary) hypertension: Secondary | ICD-10-CM | POA: Diagnosis not present

## 2019-10-31 DIAGNOSIS — N184 Chronic kidney disease, stage 4 (severe): Secondary | ICD-10-CM

## 2019-10-31 MED ORDER — METOLAZONE 5 MG PO TABS: 10.0000 mg | ORAL_TABLET | Freq: Every day | ORAL | 3 refills | Status: AC

## 2019-10-31 MED ORDER — TORSEMIDE 100 MG PO TABS: 100.0000 mg | ORAL_TABLET | Freq: Two times a day (BID) | ORAL | Status: AC

## 2019-10-31 NOTE — Progress Notes (Signed)
Cardiology Office Note    Date:  10/31/2019   ID:  [name], DOB [birth year], MRN [id number]  PCP:  Lemmie Evens, MD  Cardiologist: Kate Sable, MD (Inactive)  --> Will switch to Dr. Domenic Polite   Chief Complaint  Patient presents with   Follow-up    6 month visit    History of Present Illness:    Barbara Stone is a 84 y.o. female with past medical history of chronic systolic CHF (EF 28-41% in 06/2017, at 25% by repeat echo in 06/2017 - declined ischemic evaluation in the past by review of notes), moderate MR, persistent atrial fibrillation, HTN, Type 2 DM and Stage 4 CKD who presents to the office today for 50-month follow-up.     She most recently had a telehealth visit with Dr. Bronson Ing in 04/2019 and she denied any recent chest pain or palpitations at that time. Weight was stable and she was being followed by Nephrology given her CKD. She was continued on Coreg 12.5mg  BID and Torsemide 20mg  daily. Was not on an ACE-I/ARB/ARNI given her CKD.   In talking with the patient today, she reports worsening lower extremity edema over the past several weeks. Says her weight was previously around 190 lbs but was elevated to 206 lbs during her visit with Dr. Theador Hawthorne last week. She is unsure of her medication regimen but by review of his notes, Torsemide was titrated to 100mg  BID and Metolazone from 5mg  daily to 10mg  daily.   She says her breathing has been stable and denies any progressive dyspnea on exertion. No recent orthopnea, PND, chest pain or palpitations.   She denies any evidence of active bleeding. Remains on Coumadin for anticoagulation. INR was at 2.2 when checked last week.    Past Medical History:  Diagnosis Date   Anxiety    Atrial fibrillation (HCC)    Chronic systolic CHF (congestive heart failure) (HCC)    CKD (chronic kidney disease)    Diabetes mellitus without complication ([place])    Hypertension     Past Surgical History:  Procedure Laterality  Date   BACK SURGERY     EYE SURGERY Left     Current Medications: Outpatient Medications Prior to Visit  Medication Sig Dispense Refill   acetaminophen (TYLENOL) 325 MG tablet Take 2 tablets (650 mg total) by mouth every 6 (six) hours as needed for mild pain or fever (or Fever >/= 101). 15 tablet 1   aspirin EC 81 MG tablet Take 81 mg by mouth daily.     carvedilol (COREG) 12.5 MG tablet Take 1 tablet (12.5 mg total) by mouth 2 (two) times daily. 60 tablet 2   Cholecalciferol (VITAMIN D PO) Take 1 tablet by mouth daily.     COMBIGAN 0.2-0.5 % ophthalmic solution Place 1 drop into both eyes 2 (two) times daily.  6   EAR DROPS 6.5 % OTIC solution      FEROSUL 325 (65 Fe) MG tablet Take 1 tablet by mouth 2 (two) times daily.  11   glimepiride (AMARYL) 1 MG tablet Take 1 mg by mouth daily with breakfast.     LORazepam (ATIVAN) 0.5 MG tablet Take 1 tablet by mouth as needed.  5   Potassium Chloride ER 20 MEQ TBCR Take 20 mEq by mouth See admin instructions. Take 2 tablets (40 meq)  every M/W/F, take 1 tablet (20 Meq) every T/T/S/S 50 tablet 5   predniSONE (DELTASONE) 10 MG tablet      traMADol (  ULTRAM) 50 MG tablet Take 1 tablet (50 mg total) by mouth every 6 (six) hours as needed for severe pain. 12 tablet 0   TRAVATAN Z 0.004 % SOLN ophthalmic solution Place 1 drop into both eyes at bedtime.  6   warfarin (COUMADIN) 5 MG tablet Take 5mg  on Monday and Thursday, then 2.5mg  all other days 90 tablet 3   torsemide (DEMADEX) 20 MG tablet Take 20 mg by mouth 2 (two) times daily.     No facility-administered medications prior to visit.     Allergies:   Patient has no known allergies.   Social History   Socioeconomic History   Marital status: Widowed    Spouse name: Not on file   Number of children: Not on file   Years of education: Not on file   Highest education level: Not on file  Occupational History   Not on file  Tobacco Use   Smoking status: Never Smoker    Smokeless tobacco: Never Used  Vaping Use   Vaping Use: Never used  Substance and Sexual Activity   Alcohol use: No   Drug use: No   Sexual activity: Not on file  Other Topics Concern   Not on file  Social History Narrative   Brother-in-law lives with patient.    Social Determinants of Health   Financial Resource Strain:    Difficulty of Paying Living Expenses: Not on file  Food Insecurity:    Worried About Charity fundraiser in the Last Year: Not on file   [employer] of Food in the Last Year: Not on file  Transportation Needs:    Lack of Transportation (Medical): Not on file   Lack of Transportation (Non-Medical): Not on file  Physical Activity:    Days of Exercise per Week: Not on file   Minutes of Exercise per Session: Not on file  Stress:    Feeling of Stress : Not on file  Social Connections:    Frequency of Communication with Friends and Family: Not on file   Frequency of Social Gatherings with Friends and Family: Not on file   Attends Religious Services: Not on file   Active Member of Clubs or Organizations: Not on file   Attends Archivist Meetings: Not on file   Marital Status: Not on file     Family History:  The patient's family history is not on file.   Review of Systems:   Please see the history of present illness.     General:  No chills, fever, night sweats or weight changes.  Cardiovascular:  No chest pain, dyspnea on exertion, orthopnea, palpitations, paroxysmal nocturnal dyspnea. Positive for edema.  Dermatological: No rash, lesions/masses Respiratory: No cough, dyspnea Urologic: No hematuria, dysuria Abdominal:   No nausea, vomiting, diarrhea, bright red blood per rectum, melena, or hematemesis Neurologic:  No visual changes, wkns, changes in mental status. All other systems reviewed and are otherwise negative except as noted above.   Physical Exam:    VS:  BP 100/70    Pulse 89    Ht 5\' 6"  (1.676 m)    Wt 204 lb 3.2  oz (92.6 kg)    BMI 32.96 kg/m    General: Well developed, elderly female appearing in no acute distress. Head: Normocephalic, atraumatic. Neck: No carotid bruits. JVD not elevated.  Lungs: Respirations regular and unlabored, without wheezes or rales.  Heart: Irregularly irregular. No S3 or S4.  2/6 holosystolic murmur along Apex.  Abdomen: Appears  non-distended. No obvious abdominal masses. Msk:  Strength and tone appear normal for age. No obvious joint deformities or effusions. Extremities: No clubbing or cyanosis. 2+ pitting edema with weeping noted.  Distal pedal pulses are 2+ bilaterally. Neuro: Alert and oriented X 3. Moves all extremities spontaneously. No focal deficits noted. Psych:  Responds to questions appropriately with a normal affect. Skin: No rashes or lesions noted  Wt Readings from Last 3 Encounters:  10/31/19 204 lb 3.2 oz (92.6 kg)  01/12/19 191 lb (86.6 kg)  10/07/18 202 lb (91.6 kg)      Studies/Labs Reviewed:   EKG:  EKG is ordered today.  The ekg ordered today demonstrates atrial fibrillation, HR 89 with LAD and known LBBB.   Recent Labs: 11/01/2018: BUN 53; Creatinine, Ser 2.21; Potassium 4.3; Sodium 138   Lipid Panel    Component Value Date/Time   CHOL 142 02/14/2018 1142   TRIG 65 02/14/2018 1142   HDL 35 (L) 02/14/2018 1142   CHOLHDL 4.1 02/14/2018 1142   VLDL 13 02/14/2018 1142   LDLCALC 94 02/14/2018 1142    Additional studies/ records that were reviewed today include:   Echocardiogram: 06/2017 Study Conclusions   - Left ventricle: The cavity size was normal. Wall thickness was  increased in a pattern of mild LVH. The estimated ejection  fraction was 25%. Diffuse hypokinesis. There is moderate  hypokinesis of the apicalinferior and apical myocardium. The  study was not technically sufficient to allow evaluation of LV  diastolic dysfunction due to atrial fibrillation.  - Ventricular septum: Septal motion showed abnormal function  and  dyssynergy.  - Aortic valve: Moderately calcified annulus. Probably trileaflet.  There was mild regurgitation.  - Mitral valve: Mildly calcified annulus. There was moderate  regurgitation directed posteriorly.  - Right atrium: Central venous pressure (est): 3 mm Hg.  - Tricuspid valve: There was moderate regurgitation.  - Pulmonary arteries: Systolic pressure was severely increased. PA  peak pressure: 70 mm Hg (S).  - Pericardium, extracardiac: There was no pericardial effusion.   Assessment:    1. Chronic systolic heart failure (HCC)   2. Persistent atrial fibrillation (HCC)   3. Valvular heart disease   4. Essential hypertension   5. Chronic kidney disease (CKD), stage IV (severe) (HCC)      Plan:   In order of problems listed above:  1. Chronic Systolic CHF - She has a known reduced EF of 25% by echocardiogram in 06/2017 and she had declined ischemic evaluation in the past by review of notes and would not anticipate ischemic testing at this time as she would be at high-risk for contrast induced nephropathy given her advanced CKD.  - Her diuretic regimen is managed by Nephrology and she is currently prescribed Torsemide 100mg  BID and Metolazone 10mg  daily. She is unsure what she is currently taking and I am concerned about her compliance with the prescribed dose. Her daughter was in the lobby today and I asked her to verify the correct doses upon them returning home. She has close follow-up with Nephrology on Friday and I reviewed with the patient and her daughter that if symptoms have not improved, then she might require admission for IV diuresis given her volume overload. Will update an echocardiogram for reassessment of her EF.  - Continue Coreg 12.5mg  BID. Not on ACE-I/ARB/ARNI given her CKD. BP is soft at 100/70 during today's visit and does not allow for the addition of Hydralazine or Nitrates at this time.   2. Persistent  Atrial Fibrillation - She denies any  recent palpitations. Continue Coreg for rate-control.  - She denies any evidence of active bleeding. Hgb stable at 10.9 by recent labs on 10/26/2019. Remains on Coumadin for anticoagulation.   3. Mitral Regurgitation - Moderate by echo in 06/2017. Will obtain a repeat echocardiogram as outlined above.   4. HTN - BP is soft at 100/70 during today's visit. Continue Coreg at current dosing.   5. Stage 4 CKD - Followed by Dr. Theador Hawthorne. Creatinine was at 2.61 by recent labs on 10/26/2019.    Medication Adjustments/Labs and Tests Ordered: Current medicines are reviewed at length with the patient today.  Concerns regarding medicines are outlined above.  Medication changes, Labs and Tests ordered today are listed in the Patient Instructions below. Patient Instructions  Medication Instructions:  Make sure you are taking Torsemide 100 mg twice a day   Also take Metolazone 10 mg daily   *If you need a refill on your cardiac medications before your next appointment, please call your pharmacy*   Lab Work: None today If you have labs (blood work) drawn today and your tests are completely normal, you will receive your results only by:  [place] (if you have MyChart) OR  A paper copy in the mail If you have any lab test that is abnormal or we need to change your treatment, we will call you to review the results.   Testing/Procedures: Your physician has requested that you have an echocardiogram. Echocardiography is a painless test that uses sound waves to create images of your heart. It provides your doctor with information about the size and shape of your heart and how well your hearts chambers and valves are working. This procedure takes approximately one hour. There are no restrictions for this procedure.      Follow-Up: At [facility], you and your health needs are our priority.  As part of our continuing mission to provide you with exceptional heart care, we have created  designated Provider Care Teams.  These Care Teams include your primary Cardiologist (physician) and Advanced Practice Providers (APPs -  Physician Assistants and Nurse Practitioners) who all work together to provide you with the care you need, when you need it.  We recommend signing up for the patient portal called "MyChart".  Sign up information is provided on this After Visit Summary.  MyChart is used to connect with patients for Virtual Visits (Telemedicine).  Patients are able to view lab/test results, encounter notes, upcoming appointments, etc.  Non-urgent messages can be sent to your provider as well.   To learn more about what you can do with MyChart, go to [email or web address].    Your next appointment:   2 month(s)  The format for your next appointment:   In Person  Provider:   Rozann Lesches, MD or Bernerd Pho, PA-C   Other Instructions None      Thank you for choosing [place] !            Signed, Erma Heritage, PA-C  10/31/2019 8:02 PM    [place] [address] [place], [place] [address] Phone: [phone] Fax: [phone]

## 2019-10-31 NOTE — Patient Instructions (Signed)
Medication Instructions:  Make sure you are taking Torsemide 100 mg twice a day   Also take Metolazone 10 mg daily   *If you need a refill on your cardiac medications before your next appointment, please call your pharmacy*   Lab Work: None today If you have labs (blood work) drawn today and your tests are completely normal, you will receive your results only by: Marland Kitchen MyChart Message (if you have MyChart) OR . A paper copy in the mail If you have any lab test that is abnormal or we need to change your treatment, we will call you to review the results.   Testing/Procedures: Your physician has requested that you have an echocardiogram. Echocardiography is a painless test that uses sound waves to create images of your heart. It provides your doctor with information about the size and shape of your heart and how well your heart's chambers and valves are working. This procedure takes approximately one hour. There are no restrictions for this procedure.      Follow-Up: At [facility], you and your health needs are our priority.  As part of our continuing mission to provide you with exceptional heart care, we have created designated Provider Care Teams.  These Care Teams include your primary Cardiologist (physician) and Advanced Practice Providers (APPs -  Physician Assistants and Nurse Practitioners) who all work together to provide you with the care you need, when you need it.  We recommend signing up for the patient portal called "MyChart".  Sign up information is provided on this After Visit Summary.  MyChart is used to connect with patients for Virtual Visits (Telemedicine).  Patients are able to view lab/test results, encounter notes, upcoming appointments, etc.  Non-urgent messages can be sent to your provider as well.   To learn more about what you can do with MyChart, go to [email or web address].    Your next appointment:   2 month(s)  The format for your next appointment:   In  Person  Provider:   Rozann Lesches, MD or Bernerd Pho, PA-C   Other Instructions None      Thank you for choosing [place] !

## 2019-11-08 ENCOUNTER — Ambulatory Visit (HOSPITAL_COMMUNITY): Admission: RE | Admit: 2019-11-08 | Payer: Medicare Other | Source: Ambulatory Visit

## 2019-11-15 ENCOUNTER — Encounter (INDEPENDENT_AMBULATORY_CARE_PROVIDER_SITE_OTHER): Payer: Medicare Other | Admitting: Ophthalmology

## 2019-11-16 ENCOUNTER — Encounter (INDEPENDENT_AMBULATORY_CARE_PROVIDER_SITE_OTHER): Payer: Medicare Other | Admitting: Ophthalmology

## 2019-11-16 ENCOUNTER — Emergency Department (HOSPITAL_COMMUNITY): Payer: Medicare Other

## 2019-11-16 ENCOUNTER — Inpatient Hospital Stay (HOSPITAL_COMMUNITY)
Admission: EM | Admit: 2019-11-16 | Discharge: 2019-11-20 | DRG: 291 | Disposition: A | Discharge status: Skilled Nursing Facility | Payer: Medicare Other | Attending: Family Medicine | Admitting: Family Medicine

## 2019-11-16 ENCOUNTER — Other Ambulatory Visit: Payer: Self-pay

## 2019-11-16 ENCOUNTER — Encounter (HOSPITAL_COMMUNITY): Payer: Self-pay

## 2019-11-16 DIAGNOSIS — E1122 Type 2 diabetes mellitus with diabetic chronic kidney disease: Secondary | ICD-10-CM | POA: Diagnosis present

## 2019-11-16 DIAGNOSIS — W19XXXA Unspecified fall, initial encounter: Secondary | ICD-10-CM | POA: Diagnosis present

## 2019-11-16 DIAGNOSIS — I509 Heart failure, unspecified: Secondary | ICD-10-CM | POA: Diagnosis not present

## 2019-11-16 DIAGNOSIS — S0083XA Contusion of other part of head, initial encounter: Secondary | ICD-10-CM | POA: Diagnosis present

## 2019-11-16 DIAGNOSIS — E11649 Type 2 diabetes mellitus with hypoglycemia without coma: Secondary | ICD-10-CM | POA: Diagnosis present

## 2019-11-16 DIAGNOSIS — Z79899 Other long term (current) drug therapy: Secondary | ICD-10-CM

## 2019-11-16 DIAGNOSIS — H1131 Conjunctival hemorrhage, right eye: Secondary | ICD-10-CM | POA: Diagnosis present

## 2019-11-16 DIAGNOSIS — I10 Essential (primary) hypertension: Secondary | ICD-10-CM | POA: Diagnosis present

## 2019-11-16 DIAGNOSIS — I5023 Acute on chronic systolic (congestive) heart failure: Secondary | ICD-10-CM | POA: Diagnosis present

## 2019-11-16 DIAGNOSIS — N189 Chronic kidney disease, unspecified: Secondary | ICD-10-CM | POA: Diagnosis present

## 2019-11-16 DIAGNOSIS — I130 Hypertensive heart and chronic kidney disease with heart failure and stage 1 through stage 4 chronic kidney disease, or unspecified chronic kidney disease: Principal | ICD-10-CM | POA: Diagnosis present

## 2019-11-16 DIAGNOSIS — Z79890 Hormone replacement therapy (postmenopausal): Secondary | ICD-10-CM

## 2019-11-16 DIAGNOSIS — I340 Nonrheumatic mitral (valve) insufficiency: Secondary | ICD-10-CM | POA: Diagnosis present

## 2019-11-16 DIAGNOSIS — N179 Acute kidney failure, unspecified: Secondary | ICD-10-CM | POA: Diagnosis present

## 2019-11-16 DIAGNOSIS — Z20822 Contact with and (suspected) exposure to covid-19: Secondary | ICD-10-CM | POA: Diagnosis present

## 2019-11-16 DIAGNOSIS — I482 Chronic atrial fibrillation, unspecified: Secondary | ICD-10-CM | POA: Diagnosis present

## 2019-11-16 DIAGNOSIS — N184 Chronic kidney disease, stage 4 (severe): Secondary | ICD-10-CM | POA: Diagnosis present

## 2019-11-16 DIAGNOSIS — I4891 Unspecified atrial fibrillation: Secondary | ICD-10-CM | POA: Diagnosis present

## 2019-11-16 DIAGNOSIS — R531 Weakness: Secondary | ICD-10-CM | POA: Diagnosis present

## 2019-11-16 DIAGNOSIS — Z7901 Long term (current) use of anticoagulants: Secondary | ICD-10-CM

## 2019-11-16 DIAGNOSIS — R269 Unspecified abnormalities of gait and mobility: Secondary | ICD-10-CM | POA: Diagnosis present

## 2019-11-16 DIAGNOSIS — Z7952 Long term (current) use of systemic steroids: Secondary | ICD-10-CM

## 2019-11-16 DIAGNOSIS — S0081XA Abrasion of other part of head, initial encounter: Secondary | ICD-10-CM | POA: Diagnosis present

## 2019-11-16 DIAGNOSIS — D509 Iron deficiency anemia, unspecified: Secondary | ICD-10-CM | POA: Diagnosis present

## 2019-11-16 DIAGNOSIS — Z7982 Long term (current) use of aspirin: Secondary | ICD-10-CM

## 2019-11-16 DIAGNOSIS — R55 Syncope and collapse: Secondary | ICD-10-CM | POA: Diagnosis present

## 2019-11-16 DIAGNOSIS — F419 Anxiety disorder, unspecified: Secondary | ICD-10-CM | POA: Diagnosis present

## 2019-11-16 NOTE — ED Notes (Signed)
Family in room  

## 2019-11-16 NOTE — ED Provider Notes (Signed)
[facility] EMERGENCY DEPARTMENT Provider Note   CSN: [id number] Arrival date & time: 11/16/19  2029     History Chief Complaint  Patient presents with  . Fall    Barbara Stone is a 84 y.o. female.  Patient is an 84 year old female with past medical history of congestive heart failure, atrial fibrillation on Coumadin, chronic renal insufficiency, diabetes, hypertension. Patient lives at home with her son-in-law. Patient apparently fell getting up to go to the bathroom. She tells me that she became weak and ended up on the floor. She has abrasions to the right eye and bridge of her nose, but does not recall hitting her head. She denies to me she is experiencing any pain. She denies headache, neck pain, visual disturbances, chest pain, abdominal pain, or extremity pain.  The history is provided by the patient.  Fall This is a new problem. The current episode started 1 to 2 hours ago. The problem occurs constantly. The problem has not changed since onset.Nothing aggravates the symptoms. Nothing relieves the symptoms. She has tried nothing for the symptoms.       Past Medical History:  Diagnosis Date  . Anxiety   . Atrial fibrillation ([place])   . Chronic systolic CHF (congestive heart failure) ([place])   . CKD (chronic kidney disease)   . Diabetes mellitus without complication ([place])   . Hypertension     Patient Active Problem List   Diagnosis Date Noted  . Acute exacerbation of CHF (congestive heart failure) ([place]) 08/03/2018  . Acute on chronic HFrEF (heart failure with reduced ejection fraction)/EF 25 % 08/03/2018  . Type 2 diabetes mellitus with stage 4 chronic kidney disease ([place]) 08/03/2018  . Anticoagulant long-term use/Coumadin for Afib 08/03/2018  . Cellulitis of left hand 02/04/2018  . Acute on chronic systolic (congestive) heart failure ([place]) 06/27/2017  . Acute-on-chronic kidney injury ([place]) 06/27/2017  . Community acquired pneumonia 06/27/2017  . Diabetes mellitus  without complication ([place])   . IDA (iron deficiency anemia) 12/14/2016  . Acute respiratory failure with hypoxemia ([place]) 03/19/2016  . Chronic systolic CHF (congestive heart failure) ([place]) 03/19/2016  . HTN (hypertension) 03/19/2016  . Chronic kidney disease (CKD), stage IV (severe) ([place]) 03/19/2016  . Atrial fibrillation ([place]) [I48.91] 03/09/2016  . Encounter for therapeutic drug monitoring 03/09/2016    Past Surgical History:  Procedure Laterality Date  . BACK SURGERY    . EYE SURGERY Left      OB History   No obstetric history on file.     Family History  Problem Relation Age of Onset  . Colon cancer Neg Hx     Social History   Tobacco Use  . Smoking status: Never Smoker  . Smokeless tobacco: Never Used  Vaping Use  . Vaping Use: Never used  Substance Use Topics  . Alcohol use: No  . Drug use: No    Home Medications Prior to Admission medications   Medication Sig Start Date End Date Taking? Authorizing Provider  acetaminophen (TYLENOL) 325 MG tablet Take 2 tablets (650 mg total) by mouth every 6 (six) hours as needed for mild pain or fever (or Fever >/= 101). 08/06/18   Emokpae, Courage, MD  aspirin EC 81 MG tablet Take 81 mg by mouth daily.    [provider]  carvedilol (COREG) 12.5 MG tablet Take 1 tablet (12.5 mg total) by mouth 2 (two) times daily. 08/06/18   Roxan Hockey, MD  Cholecalciferol (VITAMIN D PO) Take 1 tablet by mouth daily.  [provider]  COMBIGAN 0.2-0.5 % ophthalmic solution Place 1 drop into both eyes 2 (two) times daily. 06/22/17   [provider]  EAR DROPS 6.5 % OTIC solution  10/04/18   [provider]  FEROSUL 325 (65 Fe) MG tablet Take 1 tablet by mouth 2 (two) times daily. 06/26/17   [provider]  glimepiride (AMARYL) 1 MG tablet Take 1 mg by mouth daily with breakfast.    [provider]  LORazepam (ATIVAN) 0.5 MG tablet Take 1 tablet by mouth as needed. 06/22/17   [provider]  metolazone (ZAROXOLYN) 5 MG tablet Take 2 tablets (10 mg total) by mouth daily. 10/31/19 01/29/20  Strader, Fransisco Hertz, PA-C  Potassium Chloride ER 20 MEQ TBCR Take 20 mEq by mouth See admin instructions. Take 2 tablets (40 meq)  every M/W/F, take 1 tablet (20 Meq) every T/T/S/S 08/06/18   Roxan Hockey, MD  predniSONE (DELTASONE) 10 MG tablet  10/04/18   [provider]  torsemide (DEMADEX) 100 MG tablet Take 1 tablet (100 mg total) by mouth 2 (two) times daily. 10/31/19   Strader, Fransisco Hertz, PA-C  traMADol (ULTRAM) 50 MG tablet Take 1 tablet (50 mg total) by mouth every 6 (six) hours as needed for severe pain. 02/06/18   Johnson, Clanford L, MD  TRAVATAN Z 0.004 % SOLN ophthalmic solution Place 1 drop into both eyes at bedtime. 06/22/17   [provider]  warfarin (COUMADIN) 5 MG tablet Take 5mg  on Monday and Thursday, then 2.5mg  all other days 08/09/19   Verta Ellen., NP    Allergies    Patient has no known allergies.  Review of Systems   Review of Systems  All other systems reviewed and are negative.   Physical Exam Updated Vital Signs BP 103/65   Pulse (!) 116   Temp 97.7 F (36.5 C) (Oral)   Resp 17   Ht 5\' 6"  (1.676 m)   Wt 92.6 kg   SpO2 96%   BMI 32.95 kg/m   Physical Exam Vitals and nursing note reviewed.  Constitutional:      General: She is not in acute distress.    Appearance: She is well-developed. She is not diaphoretic.  HENT:     Head: Normocephalic.     Comments: There is an abrasion to the bridge of the nose. There is no bleeding from the nose and septum is midline. Eyes:     Comments: She does have a subconjunctival hemorrhage to the medial aspect of the right eye. The cornea is clear and there is no hyphema. Pupils are equal and reactive.  Cardiovascular:     Rate and Rhythm: Normal rate and regular rhythm.     Heart sounds: No murmur heard.  No friction rub. No gallop.   Pulmonary:     Effort: Pulmonary effort is  normal. No respiratory distress.     Breath sounds: Normal breath sounds. No wheezing.  Abdominal:     General: Bowel sounds are normal. There is no distension.     Palpations: Abdomen is soft.     Tenderness: There is no abdominal tenderness.  Musculoskeletal:        General: Normal range of motion.     Cervical back: Normal range of motion and neck supple.  Skin:    General: Skin is warm and dry.  Neurological:     Mental Status: She is alert and oriented to person, place, and time.  Cranial Nerves: No cranial nerve deficit.     Motor: No weakness.     Coordination: Coordination normal.     ED Results / Procedures / Treatments   Labs (all labs ordered are listed, but only abnormal results are displayed) Labs Reviewed  COMPREHENSIVE METABOLIC PANEL  CBC WITH DIFFERENTIAL/PLATELET  BRAIN NATRIURETIC PEPTIDE  PROTIME-INR  URINALYSIS, ROUTINE W REFLEX MICROSCOPIC  TROPONIN I (HIGH SENSITIVITY)    EKG EKG Interpretation  Date/Time:  Thursday November 16 2019 20:37:15 EDT Ventricular Rate:  94 PR Interval:    QRS Duration: 129 QT Interval:  390 QTC Calculation: 488 R Axis:   -69 Text Interpretation: Atrial fibrillation Ventricular premature complex IVCD, consider atypical RBBB Inferior infarct, old Anterior infarct, old Lateral leads are also involved No significant change since 08/03/2018 Confirmed by Veryl Speak [phone]) on 11/17/2019 5:19:16 AM   Radiology No results found.  Procedures Procedures (including critical care time)  Medications Ordered in ED Medications - No data to display  ED Course  I have reviewed the triage vital signs and the nursing notes.  Pertinent labs & imaging results that were available during my care of the patient were reviewed by me and considered in my medical decision making (see chart for details).    MDM Rules/Calculators/A&P  Patient with history of CHF brought for evaluation of a fall.  Patient apparently became weak while  attempting to ambulate to the bathroom and fell.  She has contusions below both eyes and bridge of her nose, however CT of the head and maxillofacial bones are unremarkable.  Patient's laboratory studies and imaging studies consistent with an exacerbation of CHF.  Patient was given Lasix.  She was also found to have a blood sugar of 44 and was given crackers, peanut butter, and orange juice.  An attempt at ambulation caused the patient to become short of breath and lightheaded, and she nearly fell again.  I do not feel as though patient can safely be discharged.  Patient to be admitted for diuresis and further observation.  I have spoken with Dr. Josephine Cables who agrees to admit.  Final Clinical Impression(s) / ED Diagnoses Final diagnoses:  None    Rx / DC Orders ED Discharge Orders    None       Veryl Speak, MD 11/17/19 [phone]

## 2019-11-16 NOTE — ED Triage Notes (Addendum)
Pt brought in by EMS for a fall and generalized weakness. EMS report that pt lives with son, who was not at home, sheriff dept called for wellness check and found pt in floor with laceration and hematoma to right eye. Pt denies pain at this time. Pt was able to bear some weight on legs per EMS. Stroke screen negative per EMS. Pt currently in A-fib with controlled rate- pt takes coumadin.

## 2019-11-16 NOTE — ED Notes (Signed)
Pt hard of hearing.

## 2019-11-17 DIAGNOSIS — Z79899 Other long term (current) drug therapy: Secondary | ICD-10-CM | POA: Diagnosis not present

## 2019-11-17 DIAGNOSIS — I509 Heart failure, unspecified: Secondary | ICD-10-CM | POA: Diagnosis present

## 2019-11-17 DIAGNOSIS — R531 Weakness: Secondary | ICD-10-CM | POA: Diagnosis present

## 2019-11-17 DIAGNOSIS — I5023 Acute on chronic systolic (congestive) heart failure: Secondary | ICD-10-CM | POA: Diagnosis not present

## 2019-11-17 DIAGNOSIS — N184 Chronic kidney disease, stage 4 (severe): Secondary | ICD-10-CM

## 2019-11-17 DIAGNOSIS — E1122 Type 2 diabetes mellitus with diabetic chronic kidney disease: Secondary | ICD-10-CM

## 2019-11-17 DIAGNOSIS — I10 Essential (primary) hypertension: Secondary | ICD-10-CM | POA: Diagnosis not present

## 2019-11-17 DIAGNOSIS — I340 Nonrheumatic mitral (valve) insufficiency: Secondary | ICD-10-CM | POA: Diagnosis present

## 2019-11-17 DIAGNOSIS — R55 Syncope and collapse: Secondary | ICD-10-CM | POA: Diagnosis present

## 2019-11-17 DIAGNOSIS — D509 Iron deficiency anemia, unspecified: Secondary | ICD-10-CM | POA: Diagnosis present

## 2019-11-17 DIAGNOSIS — S0083XA Contusion of other part of head, initial encounter: Secondary | ICD-10-CM | POA: Diagnosis present

## 2019-11-17 DIAGNOSIS — Z7982 Long term (current) use of aspirin: Secondary | ICD-10-CM | POA: Diagnosis not present

## 2019-11-17 DIAGNOSIS — I130 Hypertensive heart and chronic kidney disease with heart failure and stage 1 through stage 4 chronic kidney disease, or unspecified chronic kidney disease: Secondary | ICD-10-CM | POA: Diagnosis present

## 2019-11-17 DIAGNOSIS — W19XXXA Unspecified fall, initial encounter: Secondary | ICD-10-CM | POA: Diagnosis present

## 2019-11-17 DIAGNOSIS — N179 Acute kidney failure, unspecified: Secondary | ICD-10-CM | POA: Diagnosis present

## 2019-11-17 DIAGNOSIS — Z7901 Long term (current) use of anticoagulants: Secondary | ICD-10-CM | POA: Diagnosis not present

## 2019-11-17 DIAGNOSIS — E11649 Type 2 diabetes mellitus with hypoglycemia without coma: Secondary | ICD-10-CM | POA: Diagnosis present

## 2019-11-17 DIAGNOSIS — R269 Unspecified abnormalities of gait and mobility: Secondary | ICD-10-CM | POA: Diagnosis present

## 2019-11-17 DIAGNOSIS — Z20822 Contact with and (suspected) exposure to covid-19: Secondary | ICD-10-CM | POA: Diagnosis present

## 2019-11-17 DIAGNOSIS — I482 Chronic atrial fibrillation, unspecified: Secondary | ICD-10-CM | POA: Diagnosis present

## 2019-11-17 DIAGNOSIS — I4891 Unspecified atrial fibrillation: Secondary | ICD-10-CM | POA: Diagnosis not present

## 2019-11-17 DIAGNOSIS — H1131 Conjunctival hemorrhage, right eye: Secondary | ICD-10-CM | POA: Diagnosis present

## 2019-11-17 DIAGNOSIS — Z7952 Long term (current) use of systemic steroids: Secondary | ICD-10-CM | POA: Diagnosis not present

## 2019-11-17 DIAGNOSIS — Z79890 Hormone replacement therapy (postmenopausal): Secondary | ICD-10-CM | POA: Diagnosis not present

## 2019-11-17 DIAGNOSIS — F419 Anxiety disorder, unspecified: Secondary | ICD-10-CM | POA: Diagnosis present

## 2019-11-17 DIAGNOSIS — S0081XA Abrasion of other part of head, initial encounter: Secondary | ICD-10-CM | POA: Diagnosis present

## 2019-11-17 LAB — CBC WITH DIFFERENTIAL/PLATELET
Abs Immature Granulocytes: 0.02 10*3/uL (ref 0.00–0.07)
Basophils Absolute: 0 10*3/uL (ref 0.0–0.1)
Basophils Relative: 0 %
Eosinophils Absolute: 0.4 10*3/uL (ref 0.0–0.5)
Eosinophils Relative: 5 %
HCT: 35.6 % — ABNORMAL LOW (ref 36.0–46.0)
Hemoglobin: 11.2 g/dL — ABNORMAL LOW (ref 12.0–15.0)
Immature Granulocytes: 0 %
Lymphocytes Relative: 7 %
Lymphs Abs: 0.5 10*3/uL — ABNORMAL LOW (ref 0.7–4.0)
MCH: 31.3 pg (ref 26.0–34.0)
MCHC: 31.5 g/dL (ref 30.0–36.0)
MCV: 99.4 fL (ref 80.0–100.0)
Monocytes Absolute: 0.6 10*3/uL (ref 0.1–1.0)
Monocytes Relative: 10 %
Neutro Abs: 5.2 10*3/uL (ref 1.7–7.7)
Neutrophils Relative %: 78 %
Platelets: 132 10*3/uL — ABNORMAL LOW (ref 150–400)
RBC: 3.58 MIL/uL — ABNORMAL LOW (ref 3.87–5.11)
RDW: 16.7 % — ABNORMAL HIGH (ref 11.5–15.5)
WBC: 6.7 10*3/uL (ref 4.0–10.5)
nRBC: 0 % (ref 0.0–0.2)

## 2019-11-17 LAB — COMPREHENSIVE METABOLIC PANEL
ALT: 13 U/L (ref 0–44)
AST: 39 U/L (ref 15–41)
Albumin: 3.7 g/dL (ref 3.5–5.0)
Alkaline Phosphatase: 102 U/L (ref 38–126)
Anion gap: 16 — ABNORMAL HIGH (ref 5–15)
BUN: 103 mg/dL — ABNORMAL HIGH (ref 8–23)
CO2: 26 mmol/L (ref 22–32)
Calcium: 9 mg/dL (ref 8.9–10.3)
Chloride: 98 mmol/L (ref 98–111)
Creatinine, Ser: 2.6 mg/dL — ABNORMAL HIGH (ref 0.44–1.00)
GFR, Estimated: 18 mL/min — ABNORMAL LOW (ref >60)
Glucose, Bld: 44 mg/dL — CL (ref 70–99)
Potassium: 3.5 mmol/L (ref 3.5–5.1)
Sodium: 140 mmol/L (ref 135–145)
Total Bilirubin: 2.3 mg/dL — ABNORMAL HIGH (ref 0.3–1.2)
Total Protein: 7.1 g/dL (ref 6.5–8.1)

## 2019-11-17 LAB — URINALYSIS, ROUTINE W REFLEX MICROSCOPIC
Bilirubin Urine: NEGATIVE
Glucose, UA: NEGATIVE mg/dL
Hgb urine dipstick: NEGATIVE
Ketones, ur: NEGATIVE mg/dL
Leukocytes,Ua: NEGATIVE
Nitrite: POSITIVE — AB
Protein, ur: NEGATIVE mg/dL
Specific Gravity, Urine: <1.005 — ABNORMAL LOW (ref 1.005–1.030)
pH: 6.5 (ref 5.0–8.0)

## 2019-11-17 LAB — RESPIRATORY PANEL BY RT PCR (FLU A&B, COVID)
Influenza A by PCR: NEGATIVE
Influenza B by PCR: NEGATIVE
SARS Coronavirus 2 by RT PCR: NEGATIVE

## 2019-11-17 LAB — CBG MONITORING, ED
Glucose-Capillary: 70 mg/dL (ref 70–99)
Glucose-Capillary: 74 mg/dL (ref 70–99)
Glucose-Capillary: 129 mg/dL — ABNORMAL HIGH (ref 70–99)

## 2019-11-17 LAB — PROTIME-INR
INR: 1.5 — ABNORMAL HIGH (ref 0.8–1.2)
Prothrombin Time: 17.7 seconds — ABNORMAL HIGH (ref 11.4–15.2)

## 2019-11-17 LAB — URINALYSIS, MICROSCOPIC (REFLEX)
Squamous Epithelial / HPF: NONE SEEN (ref 0–5)
WBC, UA: NONE SEEN WBC/hpf (ref 0–5)

## 2019-11-17 LAB — TROPONIN I (HIGH SENSITIVITY)
Troponin I (High Sensitivity): 189 ng/L (ref <18)
Troponin I (High Sensitivity): 170 ng/L (ref <18)

## 2019-11-17 LAB — BRAIN NATRIURETIC PEPTIDE: B Natriuretic Peptide: 1351 pg/mL — ABNORMAL HIGH (ref 0.0–100.0)

## 2019-11-17 LAB — GLUCOSE, CAPILLARY: Glucose-Capillary: 143 mg/dL — ABNORMAL HIGH (ref 70–99)

## 2019-11-17 MED ORDER — TRAZODONE HCL 50 MG PO TABS: 50.0000 mg | ORAL_TABLET | Freq: Every evening | ORAL | Status: DC | PRN

## 2019-11-17 MED ORDER — SODIUM CHLORIDE 0.9% FLUSH
3.0000 mL | INTRAVENOUS | Status: DC | PRN
  Administered 2019-11-17: 3 mL via INTRAVENOUS

## 2019-11-17 MED ORDER — INSULIN ASPART 100 UNIT/ML ~~LOC~~ SOLN
0.0000 [IU] | Freq: Three times a day (TID) | SUBCUTANEOUS | Status: DC
  Administered 2019-11-18 – 2019-11-19 (×2): 1 [IU] via SUBCUTANEOUS

## 2019-11-17 MED ORDER — TRAMADOL HCL 50 MG PO TABS
50.0000 mg | ORAL_TABLET | Freq: Four times a day (QID) | ORAL | Status: DC | PRN
  Administered 2019-11-17: 50 mg via ORAL
  Filled 2019-11-17: qty 1

## 2019-11-17 MED ORDER — LATANOPROST 0.005 % OP SOLN
1.0000 [drp] | Freq: Every day | OPHTHALMIC | Status: DC
  Administered 2019-11-17: 1 [drp] via OPHTHALMIC
  Filled 2019-11-17 (×2): qty 2.5

## 2019-11-17 MED ORDER — POTASSIUM CHLORIDE ER 20 MEQ PO TBCR: 20.0000 meq | EXTENDED_RELEASE_TABLET | ORAL | Status: DC

## 2019-11-17 MED ORDER — ACETAMINOPHEN 650 MG RE SUPP: 650.0000 mg | Freq: Four times a day (QID) | RECTAL | Status: DC | PRN

## 2019-11-17 MED ORDER — SODIUM CHLORIDE 0.9% FLUSH
3.0000 mL | Freq: Two times a day (BID) | INTRAVENOUS | Status: DC
  Administered 2019-11-18 – 2019-11-20 (×3): 3 mL via INTRAVENOUS

## 2019-11-17 MED ORDER — WARFARIN SODIUM 5 MG PO TABS
5.0000 mg | ORAL_TABLET | Freq: Once | ORAL | Status: AC
  Administered 2019-11-17: 5 mg via ORAL
  Filled 2019-11-17: qty 1

## 2019-11-17 MED ORDER — FERROUS SULFATE 325 (65 FE) MG PO TABS
325.0000 mg | ORAL_TABLET | Freq: Two times a day (BID) | ORAL | Status: DC
  Administered 2019-11-17 – 2019-11-20 (×7): 325 mg via ORAL
  Filled 2019-11-17 (×7): qty 1

## 2019-11-17 MED ORDER — SODIUM CHLORIDE 0.9% FLUSH
3.0000 mL | Freq: Two times a day (BID) | INTRAVENOUS | Status: DC
  Administered 2019-11-17 – 2019-11-20 (×6): 3 mL via INTRAVENOUS

## 2019-11-17 MED ORDER — METOLAZONE 5 MG PO TABS
10.0000 mg | ORAL_TABLET | Freq: Every day | ORAL | Status: DC
  Administered 2019-11-18 – 2019-11-20 (×3): 10 mg via ORAL
  Filled 2019-11-17: qty 1
  Filled 2019-11-17: qty 2
  Filled 2019-11-17 (×3): qty 1
  Filled 2019-11-17 (×2): qty 2

## 2019-11-17 MED ORDER — LORAZEPAM 0.5 MG PO TABS: 0.5000 mg | ORAL_TABLET | Freq: Two times a day (BID) | ORAL | Status: DC | PRN

## 2019-11-17 MED ORDER — FUROSEMIDE 10 MG/ML IJ SOLN
40.0000 mg | Freq: Once | INTRAMUSCULAR | Status: AC
  Administered 2019-11-17: 40 mg via INTRAVENOUS
  Filled 2019-11-17: qty 4

## 2019-11-17 MED ORDER — POLYETHYLENE GLYCOL 3350 17 G PO PACK: 17.0000 g | PACK | Freq: Every day | ORAL | Status: DC | PRN

## 2019-11-17 MED ORDER — BISACODYL 10 MG RE SUPP: 10.0000 mg | Freq: Every day | RECTAL | Status: DC | PRN

## 2019-11-17 MED ORDER — INSULIN ASPART 100 UNIT/ML ~~LOC~~ SOLN
0.0000 [IU] | Freq: Every day | SUBCUTANEOUS | Status: DC
  Administered 2019-11-18: 2 [IU] via SUBCUTANEOUS

## 2019-11-17 MED ORDER — ONDANSETRON HCL 4 MG PO TABS: 4.0000 mg | ORAL_TABLET | Freq: Four times a day (QID) | ORAL | Status: DC | PRN

## 2019-11-17 MED ORDER — VITAMIN D 25 MCG (1000 UNIT) PO TABS
1000.0000 [IU] | ORAL_TABLET | Freq: Every day | ORAL | Status: DC
  Administered 2019-11-17 – 2019-11-20 (×4): 1000 [IU] via ORAL
  Filled 2019-11-17 (×4): qty 1

## 2019-11-17 MED ORDER — POTASSIUM CHLORIDE CRYS ER 20 MEQ PO TBCR
20.0000 meq | EXTENDED_RELEASE_TABLET | Freq: Every day | ORAL | Status: DC
  Administered 2019-11-17 – 2019-11-20 (×4): 20 meq via ORAL
  Filled 2019-11-17 (×4): qty 1

## 2019-11-17 MED ORDER — TIMOLOL MALEATE 0.5 % OP SOLN
1.0000 [drp] | Freq: Two times a day (BID) | OPHTHALMIC | Status: DC
  Administered 2019-11-17 – 2019-11-20 (×4): 1 [drp] via OPHTHALMIC
  Filled 2019-11-17 (×2): qty 5

## 2019-11-17 MED ORDER — POTASSIUM CHLORIDE ER 20 MEQ PO TBCR: 20.0000 meq | EXTENDED_RELEASE_TABLET | Freq: Every day | ORAL | Status: DC

## 2019-11-17 MED ORDER — ASPIRIN EC 81 MG PO TBEC
81.0000 mg | DELAYED_RELEASE_TABLET | Freq: Every day | ORAL | Status: DC
  Administered 2019-11-17 – 2019-11-20 (×4): 81 mg via ORAL
  Filled 2019-11-17 (×4): qty 1

## 2019-11-17 MED ORDER — ACETAMINOPHEN 325 MG PO TABS: 650.0000 mg | ORAL_TABLET | Freq: Four times a day (QID) | ORAL | Status: DC | PRN

## 2019-11-17 MED ORDER — WARFARIN - PHARMACIST DOSING INPATIENT: Freq: Every day | Status: DC

## 2019-11-17 MED ORDER — SODIUM CHLORIDE 0.9 % IV SOLN: 250.0000 mL | INTRAVENOUS | Status: DC | PRN

## 2019-11-17 MED ORDER — ONDANSETRON HCL 4 MG/2ML IJ SOLN: 4.0000 mg | Freq: Four times a day (QID) | INTRAMUSCULAR | Status: DC | PRN

## 2019-11-17 MED ORDER — CARVEDILOL 12.5 MG PO TABS
12.5000 mg | ORAL_TABLET | Freq: Two times a day (BID) | ORAL | Status: DC
  Administered 2019-11-17 – 2019-11-20 (×5): 12.5 mg via ORAL
  Filled 2019-11-17 (×7): qty 1

## 2019-11-17 MED ORDER — FUROSEMIDE 10 MG/ML IJ SOLN
80.0000 mg | Freq: Two times a day (BID) | INTRAMUSCULAR | Status: DC
  Administered 2019-11-17 – 2019-11-19 (×5): 80 mg via INTRAVENOUS
  Filled 2019-11-17 (×5): qty 8

## 2019-11-17 MED ORDER — BRIMONIDINE TARTRATE 0.2 % OP SOLN
1.0000 [drp] | Freq: Two times a day (BID) | OPHTHALMIC | Status: DC
  Administered 2019-11-17 – 2019-11-20 (×4): 1 [drp] via OPHTHALMIC
  Filled 2019-11-17 (×2): qty 5

## 2019-11-17 MED ORDER — BRIMONIDINE TARTRATE-TIMOLOL 0.2-0.5 % OP SOLN
1.0000 [drp] | Freq: Two times a day (BID) | OPHTHALMIC | Status: DC
  Filled 2019-11-17: qty 5

## 2019-11-17 NOTE — Progress Notes (Addendum)
ANTICOAGULATION CONSULT NOTE - Initial Consult  Pharmacy Consult for Coumadin Indication: atrial fibrillation  No Known Allergies  Patient Measurements: Height: 5\' 6"  (167.6 cm) Weight: 92.6 kg (204 lb 2.3 oz) IBW/kg (Calculated) : 59.3  Vital Signs: Temp: 97.7 F (36.5 C) (10/21 2040) Temp Source: Oral (10/21 2040) BP: 95/63 (10/22 0732) Pulse Rate: 79 (10/22 0732)  Labs: Recent Labs    11/17/19 0055 11/17/19 0239  HGB 11.2*  --   HCT 35.6*  --   PLT 132*  --   LABPROT 17.7*  --   INR 1.5*  --   CREATININE 2.60*  --   TROPONINIHS 189* 170*    Estimated Creatinine Clearance: 19.1 mL/min (A) (by C-G formula based on SCr of 2.6 mg/dL (H)).   Medical History: Past Medical History:  Diagnosis Date  . Anxiety   . Atrial fibrillation ([place])   . Chronic systolic CHF (congestive heart failure) ([place])   . CKD (chronic kidney disease)   . Diabetes mellitus without complication ([place])   . Hypertension     Medications:  See med rec  Assessment: Patient presented to ED due to a fall. She is chronically anticoagulated with coumadin for afib. Pharmacy asked to dose.  INR is subtherapeutic at 1.5   Home dose adjusted at clinic to 5mg  on Monday and Thursday.  2.5mg  all other days  Goal of Therapy:  INR 2-3 Monitor platelets by anticoagulation protocol: Yes   Plan:  Coumadin 5mg  po x 1 today Daily PT-INR Monitor for S/S of bleeding  Isac Sarna, BS Vena [place], BCPS Clinical Pharmacist Pager [phone] 11/17/2019,7:57 AM

## 2019-11-17 NOTE — ED Notes (Signed)
Patient denies pain and is resting comfortably.  

## 2019-11-17 NOTE — H&P (Signed)
Patient Demographics:    Barbara Stone, is a 84 y.o. female  MRN: [id number]   DOB - Dec 23, 1935  Admit Date - 11/16/2019  Outpatient Primary MD for the patient is Lemmie Evens, MD   Assessment & Plan:    Active Problems:   Acute on chronic systolic (congestive) heart failure (HCC)   Acute on chronic HFrEF (heart failure with reduced ejection fraction)/EF 25 %   Type 2 diabetes mellitus with stage 4 chronic kidney disease (HCC)   Atrial fibrillation (HCC) [I48.91]   HTN (hypertension)   IDA (iron deficiency anemia)   Acute-on-chronic kidney injury ([place])   Acute exacerbation of CHF (congestive heart failure) (HCC)   Anticoagulant long-term use/Coumadin for Afib   Type 2 diabetes mellitus with hypoglycemia without coma (HCC)   Syncope and collapse---Due to Hypoglycemia    1)HFrEF--acute on chronic systolic dysfunction exacerbation--- last known EF 25%- declined ischemic evaluation in the past, she declines ischemia work-up again today due to concerns about nephrotoxicity of the dye -BNP is 1351 which is higher than recent baseline -Treated  with IV Lasix 80 mg twice daily -Daily weights and fluid input and output management -Baseline weight usually around 200 pounds avoid ACEI/ARB/ARNI due to renal concerns -Troponin 189 >>170   2) syncope secondary to hypoglycemia--- patient passed out at home, patient had close head injury, facial abrasions and contusions, -- blood sugar was 44, check CBGs frequently -CT head without acute findings -Hold Amaryl  3) generalized weakness and deconditioning--PT eval appreciated recommends SNF rehab, family trying to decide between SNF rehab and discharge home with home health services  4) chronic atrial fibrillation--- pharmacy y to manage Coumadin, INR currently 1.5 -  continue Coreg for rate control  5)DM2-patient had episode of hypoglycemia, resulting in syncope  --hold Amaryl as above #2, Use Novolog/Humalog Sliding scale insulin with Accu-Cheks/Fingersticks as ordered   6)CKD IV--creatinine around 2.6 which is not far from patient's baseline, -, renally adjust medications, avoid nephrotoxic agents / dehydration  / hypotension  7) chronic anemia--currently 11.2 which is higher than patient's recent baseline, -No concerns about bleeding continue to monitor closely  8) multiple comorbid conditions--- Discussed with  patient's son Hedy Camara at bedside --palliative care consult for goals of care requested  9)HTN--okay to resume Coreg, may use IV labetalol as needed elevated BP   Disposition/Need for in-Hospital Stay- patient unable to be discharged at this time due to --congestive heart failure requiring IV diuresis ,syncope secondary to hypoglycemia requiring further monitoring  Status is: Inpatient  Remains inpatient appropriate because:congestive heart failure requiring IV diuresis ,syncope secondary to hypoglycemia requiring further monitoring   Dispo: The patient is from: Home              Anticipated d/c is to: Home with HH Vs SNF              Anticipated d/c date is: 1 day  Patient currently is not medically stable to d/c. Barriers: Not Clinically Stable- congestive heart failure requiring IV diuresis ,syncope secondary to hypoglycemia requiring further monitoring    With History of - Reviewed by me  Past Medical History:  Diagnosis Date  . Anxiety   . Atrial fibrillation ([place])   . Chronic systolic CHF (congestive heart failure) ([place])   . CKD (chronic kidney disease)   . Diabetes mellitus without complication ([place])   . Hypertension       Past Surgical History:  Procedure Laterality Date  . BACK SURGERY    . EYE SURGERY Left       Chief Complaint  Patient presents with  . Fall      HPI:    Barbara Stone  is a  84 y.o. female with past medical history of chronic systolic CHF ( 06% by repeat echo in 06/2017) - declined ischemic evaluation in the past by review of notes), moderate MR, persistent atrial fibrillation, HTN, Type 2 DM and Stage 4 CKD who presented to the ED after she passed out at home, patient had close head injury, facial abrasions and contusions, -- blood sugar was 44, -- -CT head without acute findings -Patient is found to also have shortness of breath and dyspnea on exertion chest x-ray consistent with CHF, BNP elevated at 1351 -troponins are flat at 189 and 170 -Additional history obtained from patient's son Hedy Camara at bedside -Creatinine Around 2.6 which is not far from patient's baseline -INR 1.5 --Patient denies frank chest pains, no focal weakness -No concerns about seizures no incontinence no tongue biting -In ED patient remains very weak and unsteady with difficulty with ambulation  Review of systems:    In addition to the HPI above,   A full Review of  Systems was done, all other systems reviewed are negative except as noted above in HPI , .    Social History:  Reviewed by me    Social History   Tobacco Use  . Smoking status: Never Smoker  . Smokeless tobacco: Never Used  Substance Use Topics  . Alcohol use: No       Family History :  Reviewed by me    Family History  Problem Relation Age of Onset  . Colon cancer Neg Hx      Home Medications:   Prior to Admission medications   Medication Sig Start Date End Date Taking? Authorizing Provider  acetaminophen (TYLENOL) 325 MG tablet Take 2 tablets (650 mg total) by mouth every 6 (six) hours as needed for mild pain or fever (or Fever >/= 101). 08/06/18   Andray Assefa, MD  aspirin EC 81 MG tablet Take 81 mg by mouth daily.    [provider]  carvedilol (COREG) 12.5 MG tablet Take 1 tablet (12.5 mg total) by mouth 2 (two) times daily. 08/06/18   Roxan Hockey, MD  Cholecalciferol (VITAMIN D PO)  Take 1 tablet by mouth daily.    [provider]  COMBIGAN 0.2-0.5 % ophthalmic solution Place 1 drop into both eyes 2 (two) times daily. 06/22/17   [provider]  EAR DROPS 6.5 % OTIC solution  10/04/18   [provider]  FEROSUL 325 (65 Fe) MG tablet Take 1 tablet by mouth 2 (two) times daily. 06/26/17   [provider]  glimepiride (AMARYL) 1 MG tablet Take 1 mg by mouth daily with breakfast.    [provider]  LORazepam (ATIVAN) 0.5 MG tablet Take 1 tablet by mouth  as needed. 06/22/17   [provider]  metolazone (ZAROXOLYN) 5 MG tablet Take 2 tablets (10 mg total) by mouth daily. 10/31/19 01/29/20  Strader, Fransisco Hertz, PA-C  Potassium Chloride ER 20 MEQ TBCR Take 20 mEq by mouth See admin instructions. Take 2 tablets (40 meq)  every M/W/F, take 1 tablet (20 Meq) every T/T/S/S 08/06/18   Roxan Hockey, MD  predniSONE (DELTASONE) 10 MG tablet  10/04/18   [provider]  torsemide (DEMADEX) 100 MG tablet Take 1 tablet (100 mg total) by mouth 2 (two) times daily. 10/31/19   Strader, Fransisco Hertz, PA-C  traMADol (ULTRAM) 50 MG tablet Take 1 tablet (50 mg total) by mouth every 6 (six) hours as needed for severe pain. 02/06/18   Johnson, Clanford L, MD  TRAVATAN Z 0.004 % SOLN ophthalmic solution Place 1 drop into both eyes at bedtime. 06/22/17   [provider]  warfarin (COUMADIN) 5 MG tablet Take 5mg  on Monday and Thursday, then 2.5mg  all other days 08/09/19   Verta Ellen., NP     Allergies:    No Known Allergies   Physical Exam:   Vitals  Blood pressure (!) 97/56, pulse 78, temperature 98.2 F (36.8 C), temperature source Oral, resp. rate 18, height 5\' 6"  (1.676 m), weight 92.6 kg, SpO2 97 %.  Physical Examination: General appearance - alert, chronically ill appearing, and in no distress Mental status - alert, oriented to person, place, and time,  Eyes -  subconjunctival hemorrhage, periorbital ecchymosis Neck -  supple, no JVD elevation , Chest -diminished breath sounds with bibasilar rales  heart - S1 and S2 normal, regular  Abdomen - soft, nontender, nondistended, no masses or organomegaly Neurological - screening mental status exam normal, neck supple without rigidity, cranial nerves II through XII intact, DTR's normal and symmetric Extremities -2+ pitting pedal edema noted, intact peripheral pulses  Skin - warm, dry     Data Review:    CBC Recent Labs  Lab 11/17/19 0055  WBC 6.7  HGB 11.2*  HCT 35.6*  PLT 132*  MCV 99.4  MCH 31.3  MCHC 31.5  RDW 16.7*  LYMPHSABS 0.5*  MONOABS 0.6  EOSABS 0.4  BASOSABS 0.0   ------------------------------------------------------------------------------------------------------------------  Chemistries  Recent Labs  Lab 11/17/19 0055  NA 140  K 3.5  CL 98  CO2 26  GLUCOSE 44*  BUN 103*  CREATININE 2.60*  CALCIUM 9.0  AST 39  ALT 13  ALKPHOS 102  BILITOT 2.3*   ------------------------------------------------------------------------------------------------------------------ estimated creatinine clearance is 19.1 mL/min (A) (by C-G formula based on SCr of 2.6 mg/dL (H)). ------------------------------------------------------------------------------------------------------------------ No results for input(s): TSH, T4TOTAL, T3FREE, THYROIDAB in the last 72 hours.  Invalid input(s): FREET3   Coagulation profile Recent Labs  Lab 11/17/19 0055  INR 1.5*   ------------------------------------------------------------------------------------------------------------------- No results for input(s): DDIMER in the last 72 hours. -------------------------------------------------------------------------------------------------------------------  Cardiac Enzymes No results for input(s): CKMB, TROPONINI, MYOGLOBIN in the last 168 hours.  Invalid input(s):  CK ------------------------------------------------------------------------------------------------------------------    Component Value Date/Time   BNP 1,351.0 (H) 11/17/2019 0055     ---------------------------------------------------------------------------------------------------------------  Urinalysis    Component Value Date/Time   COLORURINE YELLOW 11/17/2019 0313   APPEARANCEUR CLEAR 11/17/2019 0313   LABSPEC <1.005 (L) 11/17/2019 0313   PHURINE 6.5 11/17/2019 0313   GLUCOSEU NEGATIVE 11/17/2019 0313   HGBUR NEGATIVE 11/17/2019 0313   BILIRUBINUR NEGATIVE 11/17/2019 0313   KETONESUR NEGATIVE 11/17/2019 0313   PROTEINUR NEGATIVE 11/17/2019 0313   UROBILINOGEN 0.2 09/06/2006  [place] (A) 11/17/2019 0313   LEUKOCYTESUR NEGATIVE 11/17/2019 0313    ----------------------------------------------------------------------------------------------------------------   Imaging Results:    DG Chest 1 View  Result Date: 11/17/2019 CLINICAL DATA:  Weakness, fall, altered mental status. History of heart disease, diabetes, hypertension EXAM: CHEST  1 VIEW COMPARISON:  08/03/2018 FINDINGS: Shallow inspiration. Diffuse cardiac enlargement. Perihilar infiltrates likely representing edema. No pleural effusions. No pneumothorax. Calcification of the aorta. Degenerative changes in the spine and shoulders. IMPRESSION: Cardiac enlargement with perihilar edema. Electronically Signed   By: Lucienne Capers M.D.   On: 11/17/2019 00:46   CT Head Wo Contrast  Result Date: 11/17/2019 CLINICAL DATA:  Status post fall. EXAM: CT HEAD WITHOUT CONTRAST TECHNIQUE: Contiguous axial images were obtained from the base of the skull through the vertex without intravenous contrast. COMPARISON:  None. FINDINGS: Brain: There is moderate severity cerebral atrophy with widening of the extra-axial spaces and ventricular dilatation. There are areas of decreased attenuation within the white matter tracts  of the supratentorial brain, consistent with microvascular disease changes. Vascular: No hyperdense vessel or unexpected calcification. Skull: Normal. Negative for fracture or focal lesion. Sinuses/Orbits: No acute finding. Other: There is mild left frontal scalp soft tissue swelling. Mild lateral right periorbital soft tissue swelling is also seen. IMPRESSION: 1. Mild left frontal and right periorbital soft tissue swelling. 2. Generalized cerebral atrophy. 3. No acute intracranial abnormality. Electronically Signed   By: Virgina Norfolk M.D.   On: 11/17/2019 00:35   CT Maxillofacial Wo Contrast  Result Date: 11/17/2019 CLINICAL DATA:  Status post fall. EXAM: CT MAXILLOFACIAL WITHOUT CONTRAST TECHNIQUE: Multidetector CT imaging of the maxillofacial structures was performed. Multiplanar CT image reconstructions were also generated. COMPARISON:  None. FINDINGS: Osseous: No fracture or mandibular dislocation. No destructive process. Orbits: Negative. No traumatic or inflammatory finding. Sinuses: Clear. Soft tissues: There is mild right periorbital soft tissue swelling. Limited intracranial: No significant or unexpected finding. IMPRESSION: 1. Mild right periorbital soft tissue swelling without evidence of an acute fracture. Electronically Signed   By: Virgina Norfolk M.D.   On: 11/17/2019 00:39    Radiological Exams on Admission: DG Chest 1 View  Result Date: 11/17/2019 CLINICAL DATA:  Weakness, fall, altered mental status. History of heart disease, diabetes, hypertension EXAM: CHEST  1 VIEW COMPARISON:  08/03/2018 FINDINGS: Shallow inspiration. Diffuse cardiac enlargement. Perihilar infiltrates likely representing edema. No pleural effusions. No pneumothorax. Calcification of the aorta. Degenerative changes in the spine and shoulders. IMPRESSION: Cardiac enlargement with perihilar edema. Electronically Signed   By: Lucienne Capers M.D.   On: 11/17/2019 00:46   CT Head Wo Contrast  Result Date:  11/17/2019 CLINICAL DATA:  Status post fall. EXAM: CT HEAD WITHOUT CONTRAST TECHNIQUE: Contiguous axial images were obtained from the base of the skull through the vertex without intravenous contrast. COMPARISON:  None. FINDINGS: Brain: There is moderate severity cerebral atrophy with widening of the extra-axial spaces and ventricular dilatation. There are areas of decreased attenuation within the white matter tracts of the supratentorial brain, consistent with microvascular disease changes. Vascular: No hyperdense vessel or unexpected calcification. Skull: Normal. Negative for fracture or focal lesion. Sinuses/Orbits: No acute finding. Other: There is mild left frontal scalp soft tissue swelling. Mild lateral right periorbital soft tissue swelling is also seen. IMPRESSION: 1. Mild left frontal and right periorbital soft tissue swelling. 2. Generalized cerebral atrophy. 3. No acute intracranial abnormality. Electronically Signed   By: Virgina Norfolk M.D.   On: 11/17/2019 00:35   CT  Maxillofacial Wo Contrast  Result Date: 11/17/2019 CLINICAL DATA:  Status post fall. EXAM: CT MAXILLOFACIAL WITHOUT CONTRAST TECHNIQUE: Multidetector CT imaging of the maxillofacial structures was performed. Multiplanar CT image reconstructions were also generated. COMPARISON:  None. FINDINGS: Osseous: No fracture or mandibular dislocation. No destructive process. Orbits: Negative. No traumatic or inflammatory finding. Sinuses: Clear. Soft tissues: There is mild right periorbital soft tissue swelling. Limited intracranial: No significant or unexpected finding. IMPRESSION: 1. Mild right periorbital soft tissue swelling without evidence of an acute fracture. Electronically Signed   By: Virgina Norfolk M.D.   On: 11/17/2019 00:39    DVT Prophylaxis -SCD /coumadin AM Labs Ordered, also please review Full Orders  Family Communication: Admission, patients condition and plan of care including tests being ordered have been  discussed with the patient and son Hedy Camara who indicate understanding and agree with the plan   Code Status - Full Code  Likely DC to  Home Vs SNF  Condition   stable  Roxan Hockey M.D on 11/17/2019 at 5:51 PM Go to [email or web address] -  for contact info  [facility] - Office  [phone]

## 2019-11-17 NOTE — ED Notes (Signed)
Attempted to reach pt daughter regarding bed placement. Pt daughter did not answer, voicemail left with call back number.

## 2019-11-17 NOTE — Evaluation (Signed)
Physical Therapy Evaluation Patient Details Name: Barbara Stone MRN: [id number] DOB: 1935/09/02 Today's Date: 11/17/2019   History of Present Illness  Barbara Stone is a 84 y.o. female.  Patient is an 84 year old female with past medical history of congestive heart failure, atrial fibrillation on Coumadin, chronic renal insufficiency, diabetes, hypertension. Patient lives at home with her son-in-law. Patient apparently fell getting up to go to the bathroom. She tells me that she became weak and ended up on the floor. She has abrasions to the right eye and bridge of her nose, but does not recall hitting her head. She denies to me she is experiencing any pain. She denies headache, neck pain, visual disturbances, chest pain, abdominal pain, or extremity pain.    Clinical Impression  Patient demonstrates slow labored movement for sitting up at bedside with difficulty moving legs, unsafe attempting to use SPC secondary to near loss of balance, slightly improved balance using RW, but limited to a few steps at bedside due to fatigue and fall risk.  Patient will benefit from continued physical therapy in hospital and recommended venue below to increase strength, balance, endurance for safe ADLs and gait.     Follow Up Recommendations SNF    Equipment Recommendations  Rolling walker with 5" wheels    Recommendations for Other Services       Precautions / Restrictions Precautions Precautions: Fall Restrictions Weight Bearing Restrictions: No      Mobility  Bed Mobility Overal bed mobility: Needs Assistance Bed Mobility: Supine to Sit;Sit to Supine     Supine to sit: Min assist Sit to supine: Min assist   General bed mobility comments: slow labored movement requiring assistance to move BLE    Transfers Overall transfer level: Needs assistance Equipment used: Rolling walker (2 wheeled);Straight cane Transfers: Sit to/from [facility] Sit to Stand: Min assist Stand  pivot transfers: Min assist;Mod assist       General transfer comment: slow labored movement  Ambulation/Gait Ambulation/Gait assistance: Mod assist Gait Distance (Feet): 5 Feet Assistive device: Rolling walker (2 wheeled);Straight cane Gait Pattern/deviations: Decreased step length - right;Decreased step length - left;Decreased stride length Gait velocity: decreased   General Gait Details: limited to 5-6 slow labored steps at bedside due to fall risk, c/o fatigue  Stairs            Wheelchair Mobility    Modified Rankin (Stroke Patients Only)       Balance Overall balance assessment: Needs assistance Sitting-balance support: No upper extremity supported;Feet supported Sitting balance-Leahy Scale: Fair Sitting balance - Comments: fair/good seated at EOB   Standing balance support: Single extremity supported;Bilateral upper extremity supported;During functional activity Standing balance-Leahy Scale: Poor Standing balance comment: very unsteady using SPC or RW                             Pertinent Vitals/Pain Pain Assessment: No/denies pain    Home Living Family/patient expects to be discharged to:: Private residence Living Arrangements: Other relatives Available Help at Discharge: Family;Available PRN/intermittently Type of Home: House         Home Equipment: Cane - single point;Grab bars - tub/shower      Prior Function Level of Independence: Independent with assistive device(s)         Comments: household ambulator using SPC     Hand Dominance        Extremity/Trunk Assessment   Upper Extremity Assessment Upper Extremity Assessment: Generalized  weakness    Lower Extremity Assessment Lower Extremity Assessment: Generalized weakness    Cervical / Trunk Assessment Cervical / Trunk Assessment: Normal  Communication   Communication: HOH  Cognition Arousal/Alertness: Awake/alert Behavior During Therapy: WFL for tasks  assessed/performed Overall Cognitive Status: Within Functional Limits for tasks assessed                                        General Comments      Exercises     Assessment/Plan    PT Assessment Patient needs continued PT services  PT Problem List Decreased strength;Decreased activity tolerance;Decreased balance;Decreased mobility       PT Treatment Interventions DME instruction;Gait training;Stair training;Functional mobility training;Therapeutic activities;Therapeutic exercise;Balance training;Patient/family education    PT Goals (Current goals can be found in the Care Plan section)  Acute Rehab PT Goals Patient Stated Goal: Return home after rehab PT Goal Formulation: With patient/family Time For Goal Achievement: 12/01/19 Potential to Achieve Goals: Good    Frequency Min 3X/week   Barriers to discharge        Co-evaluation               AM-PAC PT "6 Clicks" Mobility  Outcome Measure Help needed turning from your back to your side while in a flat bed without using bedrails?: A Little Help needed moving from lying on your back to sitting on the side of a flat bed without using bedrails?: A Little Help needed moving to and from a bed to a chair (including a wheelchair)?: A Lot Help needed standing up from a chair using your arms (e.g., wheelchair or bedside chair)?: A Lot Help needed to walk in hospital room?: A Lot Help needed climbing 3-5 steps with a railing? : Total 6 Click Score: 13    End of Session   Activity Tolerance: Patient tolerated treatment well;Patient limited by fatigue Patient left: in bed;with call bell/phone within reach;with family/visitor present Nurse Communication: Mobility status PT Visit Diagnosis: Unsteadiness on feet (R26.81);Other abnormalities of gait and mobility (R26.89);Muscle weakness (generalized) (M62.81)    Time: 7614-7092 PT Time Calculation (min) (ACUTE ONLY): 25 min   Charges:   PT Evaluation $PT  Eval Moderate Complexity: 1 Mod PT Treatments $Therapeutic Activity: 23-37 mins        10:18 AM, 11/17/19 Lonell Grandchild, MPT Physical Therapist with [facility] [phone] office [phone] mobile phone

## 2019-11-17 NOTE — Plan of Care (Signed)
  Problem: Acute Rehab PT Goals(only PT should resolve) Goal: Pt Will Go Supine/Side To Sit Outcome: Progressing Flowsheets (Taken 11/17/2019 1020) Pt will go Supine/Side to Sit: with min guard assist   Problem: Acute Rehab PT Goals(only PT should resolve) Goal: Patient Will Transfer Sit To/From Stand Flowsheets (Taken 11/17/2019 1020) Patient will transfer sit to/from stand:  with min guard assist  with minimal assist Goal: Pt Will Transfer Bed To Chair/Chair To Bed Flowsheets (Taken 11/17/2019 1020) Pt will Transfer Bed to Chair/Chair to Bed:  min guard assist  with min assist Goal: Pt Will Ambulate Flowsheets (Taken 11/17/2019 1020) Pt will Ambulate:  25 feet  with minimal assist  with rolling walker   10:21 AM, 11/17/19 Lonell Grandchild, MPT Physical Therapist with [facility] [phone] office [phone] mobile phone

## 2019-11-17 NOTE — ED Notes (Signed)
Critical  Glu 44 Trop 189 reported to [facility] md   Gave pt  juice  And crackers

## 2019-11-17 NOTE — TOC Initial Note (Addendum)
Transition of Care [facility]) - Initial/Assessment Note    Patient Details  Name: Barbara Stone MRN: [id number] Date of Birth: 06/18/1935  Transition of Care [facility]) CM/SW Contact:    Iona Beard, [place] Phone Number: 11/17/2019, 3:00 PM  Clinical Narrative:                 CSW attempted to call pt in room due to PT recommending SNF at d/c, no answer. Upon calling pts mobile number [phone] pts daughter Barbara Stone answered, this is a good contact number for pts daughter. Pts daughter states that pt is very hard of hearing and that she will bring pts hearing-aids when she visits. Pts daughter states that the family would be agreeable to pt going to SNF. However, daughter reports pt will most likely NOT be agreeable to SNF at d/c. Per pts daughter pt may be agreeable to [facility] though they have been trying to get it previous to hospital stay and pt did not want it. Pts daughter states that pt is able to complete ADLs independently. TOC to follow.         Patient Goals and CMS Choice        Expected Discharge Plan and Services                                                Prior Living Arrangements/Services                       Activities of Daily Living      Permission Sought/Granted                  Emotional Assessment              Admission diagnosis:  Acute exacerbation of CHF (congestive heart failure) ([place]) [I50.9] Patient Active Problem List   Diagnosis Date Noted  . Acute exacerbation of CHF (congestive heart failure) ([place]) 08/03/2018  . Acute on chronic HFrEF (heart failure with reduced ejection fraction)/EF 25 % 08/03/2018  . Type 2 diabetes mellitus with stage 4 chronic kidney disease ([place]) 08/03/2018  . Anticoagulant long-term use/Coumadin for Afib 08/03/2018  . Cellulitis of left hand 02/04/2018  . Acute on chronic systolic (congestive) heart failure ([place]) 06/27/2017  . Acute-on-chronic kidney injury ([place]) 06/27/2017  . Community  acquired pneumonia 06/27/2017  . Diabetes mellitus without complication ([place])   . IDA (iron deficiency anemia) 12/14/2016  . Acute respiratory failure with hypoxemia ([place]) 03/19/2016  . Chronic systolic CHF (congestive heart failure) ([place]) 03/19/2016  . HTN (hypertension) 03/19/2016  . Chronic kidney disease (CKD), stage IV (severe) ([place]) 03/19/2016  . Atrial fibrillation ([place]) [I48.91] 03/09/2016  . Encounter for therapeutic drug monitoring 03/09/2016   PCP:  Lemmie Evens, MD Pharmacy:   [place], [place] - [address] [place] [address] Phone: [phone] Fax: [phone]  [place] [address] [place] - [place] [address] [place] [address] [place] [address] Phone: [phone] Fax: [phone]     Social Determinants of Health (SDOH) Interventions    Readmission Risk Interventions Readmission Risk Prevention Plan 08/05/2018 08/04/2018  Transportation Screening - Complete  PCP or Specialist Appt within 3-5 Days Complete -  HRI or Home Care Consult Patient refused Complete  Social Work Consult for [place] Planning/Counseling -  Complete  Palliative Care Screening - Not Applicable  Medication Review (RN Care Manager) - Complete  Some recent data might be hidden

## 2019-11-17 NOTE — ED Notes (Signed)
Pt return from CT.

## 2019-11-18 DIAGNOSIS — I4891 Unspecified atrial fibrillation: Secondary | ICD-10-CM | POA: Diagnosis not present

## 2019-11-18 DIAGNOSIS — N184 Chronic kidney disease, stage 4 (severe): Secondary | ICD-10-CM | POA: Diagnosis not present

## 2019-11-18 DIAGNOSIS — E1122 Type 2 diabetes mellitus with diabetic chronic kidney disease: Secondary | ICD-10-CM | POA: Diagnosis not present

## 2019-11-18 DIAGNOSIS — I5023 Acute on chronic systolic (congestive) heart failure: Secondary | ICD-10-CM | POA: Diagnosis not present

## 2019-11-18 LAB — GLUCOSE, CAPILLARY
Glucose-Capillary: 103 mg/dL — ABNORMAL HIGH (ref 70–99)
Glucose-Capillary: 146 mg/dL — ABNORMAL HIGH (ref 70–99)
Glucose-Capillary: 172 mg/dL — ABNORMAL HIGH (ref 70–99)
Glucose-Capillary: 215 mg/dL — ABNORMAL HIGH (ref 70–99)

## 2019-11-18 LAB — PROTIME-INR
INR: 1.6 — ABNORMAL HIGH (ref 0.8–1.2)
Prothrombin Time: 18.8 seconds — ABNORMAL HIGH (ref 11.4–15.2)

## 2019-11-18 LAB — CBC
HCT: 32.6 % — ABNORMAL LOW (ref 36.0–46.0)
Hemoglobin: 10 g/dL — ABNORMAL LOW (ref 12.0–15.0)
MCH: 31 pg (ref 26.0–34.0)
MCHC: 30.7 g/dL (ref 30.0–36.0)
MCV: 100.9 fL — ABNORMAL HIGH (ref 80.0–100.0)
Platelets: 130 10*3/uL — ABNORMAL LOW (ref 150–400)
RBC: 3.23 MIL/uL — ABNORMAL LOW (ref 3.87–5.11)
RDW: 17 % — ABNORMAL HIGH (ref 11.5–15.5)
WBC: 5.2 10*3/uL (ref 4.0–10.5)
nRBC: 0 % (ref 0.0–0.2)

## 2019-11-18 LAB — BASIC METABOLIC PANEL
Anion gap: 15 (ref 5–15)
BUN: 100 mg/dL — ABNORMAL HIGH (ref 8–23)
CO2: 27 mmol/L (ref 22–32)
Calcium: 8.7 mg/dL — ABNORMAL LOW (ref 8.9–10.3)
Chloride: 99 mmol/L (ref 98–111)
Creatinine, Ser: 2.81 mg/dL — ABNORMAL HIGH (ref 0.44–1.00)
GFR, Estimated: 16 mL/min — ABNORMAL LOW (ref >60)
Glucose, Bld: 121 mg/dL — ABNORMAL HIGH (ref 70–99)
Potassium: 3.3 mmol/L — ABNORMAL LOW (ref 3.5–5.1)
Sodium: 141 mmol/L (ref 135–145)

## 2019-11-18 LAB — HEMOGLOBIN A1C
Hgb A1c MFr Bld: 5.4 % (ref 4.8–5.6)
Mean Plasma Glucose: 108 mg/dL

## 2019-11-18 MED ORDER — WARFARIN SODIUM 5 MG PO TABS
5.0000 mg | ORAL_TABLET | Freq: Once | ORAL | Status: AC
  Administered 2019-11-18: 5 mg via ORAL
  Filled 2019-11-18: qty 1

## 2019-11-18 MED ORDER — POTASSIUM CHLORIDE CRYS ER 20 MEQ PO TBCR
40.0000 meq | EXTENDED_RELEASE_TABLET | ORAL | Status: AC
  Administered 2019-11-18 (×2): 40 meq via ORAL
  Filled 2019-11-18 (×2): qty 4

## 2019-11-18 NOTE — Progress Notes (Signed)
Assisted to bedside commode several times today.  Able to bear weight and pivot from chair to bed and stand for pericare.  Very hard of hearing but alert and oriented.  Family agreeable to SNF for rehab.  100% on room air

## 2019-11-18 NOTE — Progress Notes (Signed)
Patient Demographics:    Barbara Stone, is a 84 y.o. female, DOB - 12/08/1935, QIO:[id number]  Admit date - 11/16/2019   Admitting Physician Barbara Hoit, DO  Outpatient Primary MD for the patient is Barbara Evens, MD  LOS - 1   Chief Complaint  Patient presents with  . Fall        Subjective:    Barbara Stone today has no fevers, no emesis,  No chest pain,   -Shortness of breath persist -Complains of fatigue and weakness  Assessment  & Plan :    Active Problems:   Acute on chronic systolic (congestive) heart failure (HCC)   Acute on chronic HFrEF (heart failure with reduced ejection fraction)/EF 25 %   Type 2 diabetes mellitus with stage 4 chronic kidney disease (HCC)   Atrial fibrillation (HCC) [I48.91]   HTN (hypertension)   IDA (iron deficiency anemia)   Acute-on-chronic kidney injury ([place])   Acute exacerbation of CHF (congestive heart failure) (HCC)   Anticoagulant long-term use/Coumadin for Afib   Type 2 diabetes mellitus with hypoglycemia without coma ([place])   Syncope and collapse---Due to Hypoglycemia  Brief Summary:- 84 y.o. female with past medical history of chronic systolic CHF ( 32% by repeat echo in 06/2017) - declined ischemic evaluation in the past by review of notes), moderate MR, persistent atrial fibrillation, HTN, Type 2 DM and Stage 4 CKD admitted on 11/17/19 --after syncopal episode in the setting of hypoglycemia and found to have acute on chronic systolic CHF exacerbation as well  A/p 1)HFrEF--acute on chronic systolic dysfunction exacerbation--- last known EF 25%- declined ischemic evaluation in the past, she declines ischemia work-up again today due to concerns about nephrotoxicity of the dye -BNP is 1351 which is higher than recent baseline -c.n  IV Lasix 80 mg twice daily -Daily weights and fluid input and output management -Baseline weight usually around 200  pounds avoid ACEI/ARB/ARNI due to renal concerns -Troponin 189 >>170   2) syncope secondary to hypoglycemia--- patient passed out at home, patient had closed head injury, facial abrasions and contusions, -- blood sugar was 44,   -CT head without acute findings -Hold Amaryl  3)Generalized weakness and deconditioning--PT eval appreciated recommends SNF rehab, family trying to decide between SNF rehab and discharge home with home health services  4) chronic atrial fibrillation--- pharmacy y to manage Coumadin,  - continue Coreg for rate control  5)DM2-patient had episode of hypoglycemia, resulting in syncope  --hold Amaryl as above #2, Use Novolog/Humalog Sliding scale insulin with Accu-Cheks/Fingersticks as ordered   6)CKD IV--creatinine up to 2.8 from  2.6 on admission which is not far from patient's baseline, -, renally adjust medications, avoid nephrotoxic agents / dehydration  / hypotension  7) chronic anemia--currently 10.0 which is higher than patient's recent baseline, -No concerns about bleeding continue to monitor closely  8) multiple comorbid conditions--- Discussed with  patient's son Barbara Stone at bedside --palliative care consult for goals of care requested  9)HTN--c/n  Coreg, may use IV labetalol as needed elevated BP   Disposition/Need for in-Hospital Stay- patient unable to be discharged at this time due to --congestive heart failure requiring IV diuresis ,syncope secondary to hypoglycemia requiring further monitoring Awaiting transfer to SNF rehab  Status is: Inpatient  Remains inpatient appropriate because:congestive heart failure requiring IV diuresis ,syncope secondary to hypoglycemia requiring further monitoring   Dispo: The patient is from: Home  Anticipated d/c is to: Home with HH Vs SNF  Anticipated d/c date is: 1 day  Patient currently is not medically stable to d/c. Barriers: Not Clinically Stable-  congestive heart failure requiring IV diuresis ,syncope secondary to hypoglycemia requiring further monitoring Code Status : Full  Family Communication:    (patient is alert, awake and coherent)  Discussed with son Barbara Stone  Consults  :  na  DVT Prophylaxis  : Coumadin SCDs   Lab Results  Component Value Date   PLT 130 (L) 11/18/2019    Inpatient Medications  Scheduled Meds: . aspirin EC  81 mg Oral Daily  . brimonidine  1 drop Both Eyes BID   And  . timolol  1 drop Both Eyes BID  . carvedilol  12.5 mg Oral BID  . cholecalciferol  1,000 Units Oral Daily  . ferrous sulfate  325 mg Oral BID  . furosemide  80 mg Intravenous Q12H  . insulin aspart  0-5 Units Subcutaneous QHS  . insulin aspart  0-6 Units Subcutaneous TID WC  . latanoprost  1 drop Both Eyes QHS  . metolazone  10 mg Oral Daily  . potassium chloride  20 mEq Oral Daily  . sodium chloride flush  3 mL Intravenous Q12H  . sodium chloride flush  3 mL Intravenous Q12H  . Warfarin - Pharmacist Dosing Inpatient   Does not apply q1600   Continuous Infusions: . sodium chloride     PRN Meds:.sodium chloride, acetaminophen **OR** acetaminophen, bisacodyl, LORazepam, ondansetron **OR** ondansetron (ZOFRAN) IV, polyethylene glycol, sodium chloride flush, traMADol, traZODone    Anti-infectives (From admission, onward)   None        Objective:   Vitals:   11/17/19 2136 11/18/19 0400 11/18/19 0557 11/18/19 1403  BP: 95/74  93/61 111/67  Pulse: 85  80 80  Resp: 16  16 16   Temp: 97.7 F (36.5 C)  97.8 F (36.6 C) 98 F (36.7 C)  TempSrc: Oral  Oral Oral  SpO2: 100%  98% 100%  Weight:  88.5 kg    Height:        Wt Readings from Last 3 Encounters:  11/18/19 88.5 kg  10/31/19 92.6 kg  01/12/19 86.6 kg     Intake/Output Summary (Last 24 hours) at 11/18/2019 1716 Last data filed at 11/18/2019 1405 Gross per 24 hour  Intake --  Output 850 ml  Net -850 ml    Physical Exam  Gen:- Awake Alert,  In no apparent  distress  HEENT:-  subconjunctival hemorrhage, periorbital ecchymosis Neck-Supple Neck,No JVD,.  Lungs-diminished in bases, no wheezing  CV- S1, S2 normal, regular  Abd-  +ve B.Sounds, Abd Soft, No tenderness,    Extremity/Skin:-Chronic 2+ pitting edema, pedal pulses present  Psych-affect is appropriate, oriented x3 Neuro-generalized weakness, no new focal deficits, no tremors   Data Review:   Micro Results Recent Results (from the past 240 hour(s))  Respiratory Panel by RT PCR (Flu A&B, Covid) - Nasopharyngeal Swab     Status: None   Collection Time: 11/17/19  4:00 AM   Specimen: Nasopharyngeal Swab  Result Value Ref Range Status   SARS Coronavirus 2 by RT PCR NEGATIVE NEGATIVE Final    Comment: (NOTE) SARS-CoV-2 target nucleic acids are NOT DETECTED.  The SARS-CoV-2 RNA is generally detectable in upper respiratoy specimens during the acute phase of infection.  The lowest concentration of SARS-CoV-2 viral copies this assay can detect is 131 copies/mL. A negative result does not preclude SARS-Cov-2 infection and should not be used as the sole basis for treatment or other patient management decisions. A negative result may occur with  improper specimen collection/handling, submission of specimen other than nasopharyngeal swab, presence of viral mutation(s) within the areas targeted by this assay, and inadequate number of viral copies (<131 copies/mL). A negative result must be combined with clinical observations, patient history, and epidemiological information. The expected result is Negative.  Fact Sheet for Patients:  PinkCheek.be  Fact Sheet for Healthcare Providers:  GravelBags.it  This test is no t yet approved or cleared by the [place] FDA and  has been authorized for detection and/or diagnosis of SARS-CoV-2 by FDA under an Emergency Use Authorization (EUA). This EUA will remain  in effect (meaning this  test can be used) for the duration of the COVID-19 declaration under Section 564(b)(1) of the Act, 21 U.S.C. section 360bbb-3(b)(1), unless the authorization is terminated or revoked sooner.     Influenza A by PCR NEGATIVE NEGATIVE Final   Influenza B by PCR NEGATIVE NEGATIVE Final    Comment: (NOTE) The Xpert Xpress SARS-CoV-2/FLU/RSV assay is intended as an aid in  the diagnosis of influenza from Nasopharyngeal swab specimens and  should not be used as a sole basis for treatment. Nasal washings and  aspirates are unacceptable for Xpert Xpress SARS-CoV-2/FLU/RSV  testing.  Fact Sheet for Patients: PinkCheek.be  Fact Sheet for Healthcare Providers: GravelBags.it  This test is not yet approved or cleared by the [place] FDA and  has been authorized for detection and/or diagnosis of SARS-CoV-2 by  FDA under an Emergency Use Authorization (EUA). This EUA will remain  in effect (meaning this test can be used) for the duration of the  Covid-19 declaration under Section 564(b)(1) of the Act, 21  U.S.C. section 360bbb-3(b)(1), unless the authorization is  terminated or revoked. Performed at [facility], [address], [place], [place] [address]     Radiology Reports DG Chest 1 View  Result Date: 11/17/2019 CLINICAL DATA:  Weakness, fall, altered mental status. History of heart disease, diabetes, hypertension EXAM: CHEST  1 VIEW COMPARISON:  08/03/2018 FINDINGS: Shallow inspiration. Diffuse cardiac enlargement. Perihilar infiltrates likely representing edema. No pleural effusions. No pneumothorax. Calcification of the aorta. Degenerative changes in the spine and shoulders. IMPRESSION: Cardiac enlargement with perihilar edema. Electronically Signed   By: Lucienne Capers M.D.   On: 11/17/2019 00:46   CT Head Wo Contrast  Result Date: 11/17/2019 CLINICAL DATA:  Status post fall. EXAM: CT HEAD WITHOUT CONTRAST TECHNIQUE:  Contiguous axial images were obtained from the base of the skull through the vertex without intravenous contrast. COMPARISON:  None. FINDINGS: Brain: There is moderate severity cerebral atrophy with widening of the extra-axial spaces and ventricular dilatation. There are areas of decreased attenuation within the white matter tracts of the supratentorial brain, consistent with microvascular disease changes. Vascular: No hyperdense vessel or unexpected calcification. Skull: Normal. Negative for fracture or focal lesion. Sinuses/Orbits: No acute finding. Other: There is mild left frontal scalp soft tissue swelling. Mild lateral right periorbital soft tissue swelling is also seen. IMPRESSION: 1. Mild left frontal and right periorbital soft tissue swelling. 2. Generalized cerebral atrophy. 3. No acute intracranial abnormality. Electronically Signed   By: Virgina Norfolk M.D.   On: 11/17/2019 00:35   CT Maxillofacial Wo Contrast  Result Date: 11/17/2019 CLINICAL DATA:  Status post  fall. EXAM: CT MAXILLOFACIAL WITHOUT CONTRAST TECHNIQUE: Multidetector CT imaging of the maxillofacial structures was performed. Multiplanar CT image reconstructions were also generated. COMPARISON:  None. FINDINGS: Osseous: No fracture or mandibular dislocation. No destructive process. Orbits: Negative. No traumatic or inflammatory finding. Sinuses: Clear. Soft tissues: There is mild right periorbital soft tissue swelling. Limited intracranial: No significant or unexpected finding. IMPRESSION: 1. Mild right periorbital soft tissue swelling without evidence of an acute fracture. Electronically Signed   By: Virgina Norfolk M.D.   On: 11/17/2019 00:39     CBC Recent Labs  Lab 11/17/19 0055 11/18/19 0610  WBC 6.7 5.2  HGB 11.2* 10.0*  HCT 35.6* 32.6*  PLT 132* 130*  MCV 99.4 100.9*  MCH 31.3 31.0  MCHC 31.5 30.7  RDW 16.7* 17.0*  LYMPHSABS 0.5*  --   MONOABS 0.6  --   EOSABS 0.4  --   BASOSABS 0.0  --     Chemistries    Recent Labs  Lab 11/17/19 0055 11/18/19 0610  NA 140 141  K 3.5 3.3*  CL 98 99  CO2 26 27  GLUCOSE 44* 121*  BUN 103* 100*  CREATININE 2.60* 2.81*  CALCIUM 9.0 8.7*  AST 39  --   ALT 13  --   ALKPHOS 102  --   BILITOT 2.3*  --    ------------------------------------------------------------------------------------------------------------------ No results for input(s): CHOL, HDL, LDLCALC, TRIG, CHOLHDL, LDLDIRECT in the last 72 hours.  Lab Results  Component Value Date   HGBA1C 5.4 11/17/2019   ------------------------------------------------------------------------------------------------------------------ No results for input(s): TSH, T4TOTAL, T3FREE, THYROIDAB in the last 72 hours.  Invalid input(s): FREET3 ------------------------------------------------------------------------------------------------------------------ No results for input(s): VITAMINB12, FOLATE, FERRITIN, TIBC, IRON, RETICCTPCT in the last 72 hours.  Coagulation profile Recent Labs  Lab 11/17/19 0055 11/18/19 0610  INR 1.5* 1.6*    No results for input(s): DDIMER in the last 72 hours.  Cardiac Enzymes No results for input(s): CKMB, TROPONINI, MYOGLOBIN in the last 168 hours.  Invalid input(s): CK ------------------------------------------------------------------------------------------------------------------    Component Value Date/Time   BNP 1,351.0 (H) 11/17/2019 2482     Roxan Hockey M.D on 11/18/2019 at 5:16 PM  Go to [email or web address] - for contact info  [facility] - Office  [phone]

## 2019-11-18 NOTE — Progress Notes (Signed)
ANTICOAGULATION CONSULT NOTE -  Pharmacy Consult for Coumadin Indication: atrial fibrillation  No Known Allergies  Patient Measurements: Height: 5\' 6"  (167.6 cm) Weight: 88.5 kg (195 lb 1.7 oz) IBW/kg (Calculated) : 59.3  Vital Signs: Temp: 97.8 F (36.6 C) (10/23 0557) Temp Source: Oral (10/23 0557) BP: 93/61 (10/23 0557) Pulse Rate: 80 (10/23 0557)  Labs: Recent Labs    11/17/19 0055 11/17/19 0239 11/18/19 0610  HGB 11.2*  --  10.0*  HCT 35.6*  --  32.6*  PLT 132*  --  130*  LABPROT 17.7*  --  18.8*  INR 1.5*  --  1.6*  CREATININE 2.60*  --  2.81*  TROPONINIHS 189* 170*  --     Estimated Creatinine Clearance: 17.3 mL/min (A) (by C-G formula based on SCr of 2.81 mg/dL (H)).   Medical History: Past Medical History:  Diagnosis Date  . Anxiety   . Atrial fibrillation ([place])   . Chronic systolic CHF (congestive heart failure) ([place])   . CKD (chronic kidney disease)   . Diabetes mellitus without complication ([place])   . Hypertension     Medications:  See med rec  Assessment: Patient presented to ED due to a fall. She is chronically anticoagulated with coumadin for afib. Pharmacy asked to dose.  INR is subtherapeutic this AM at 1.6   Home dose adjusted at clinic to 5mg  on Monday and Thursday.  2.5mg  all other days  Goal of Therapy:  INR 2-3 Monitor platelets by anticoagulation protocol: Yes   Plan:  Coumadin 5mg  po x 1 today Daily PT-INR Monitor for S/S of bleeding  Isac Sarna, BS Vena [place], BCPS Clinical Pharmacist Pager [phone] 11/18/2019,9:45 AM

## 2019-11-19 DIAGNOSIS — I10 Essential (primary) hypertension: Secondary | ICD-10-CM

## 2019-11-19 DIAGNOSIS — E1122 Type 2 diabetes mellitus with diabetic chronic kidney disease: Secondary | ICD-10-CM | POA: Diagnosis not present

## 2019-11-19 DIAGNOSIS — I5023 Acute on chronic systolic (congestive) heart failure: Secondary | ICD-10-CM | POA: Diagnosis not present

## 2019-11-19 DIAGNOSIS — N184 Chronic kidney disease, stage 4 (severe): Secondary | ICD-10-CM | POA: Diagnosis not present

## 2019-11-19 LAB — MAGNESIUM: Magnesium: 2 mg/dL (ref 1.7–2.4)

## 2019-11-19 LAB — BASIC METABOLIC PANEL
Anion gap: 11 (ref 5–15)
BUN: 112 mg/dL — ABNORMAL HIGH (ref 8–23)
CO2: 26 mmol/L (ref 22–32)
Calcium: 8.5 mg/dL — ABNORMAL LOW (ref 8.9–10.3)
Chloride: 99 mmol/L (ref 98–111)
Creatinine, Ser: 2.83 mg/dL — ABNORMAL HIGH (ref 0.44–1.00)
GFR, Estimated: 16 mL/min — ABNORMAL LOW (ref >60)
Glucose, Bld: 89 mg/dL (ref 70–99)
Potassium: 4 mmol/L (ref 3.5–5.1)
Sodium: 136 mmol/L (ref 135–145)

## 2019-11-19 LAB — GLUCOSE, CAPILLARY
Glucose-Capillary: 65 mg/dL — ABNORMAL LOW (ref 70–99)
Glucose-Capillary: 133 mg/dL — ABNORMAL HIGH (ref 70–99)
Glucose-Capillary: 125 mg/dL — ABNORMAL HIGH (ref 70–99)
Glucose-Capillary: 159 mg/dL — ABNORMAL HIGH (ref 70–99)
Glucose-Capillary: 161 mg/dL — ABNORMAL HIGH (ref 70–99)

## 2019-11-19 LAB — PROTIME-INR
INR: 1.7 — ABNORMAL HIGH (ref 0.8–1.2)
Prothrombin Time: 19.7 seconds — ABNORMAL HIGH (ref 11.4–15.2)

## 2019-11-19 MED ORDER — WARFARIN SODIUM 5 MG PO TABS
5.0000 mg | ORAL_TABLET | Freq: Once | ORAL | Status: AC
  Administered 2019-11-19: 5 mg via ORAL
  Filled 2019-11-19: qty 1

## 2019-11-19 MED ORDER — FUROSEMIDE 10 MG/ML IJ SOLN
80.0000 mg | Freq: Every day | INTRAMUSCULAR | Status: DC
  Administered 2019-11-20: 80 mg via INTRAVENOUS
  Filled 2019-11-19: qty 8

## 2019-11-19 MED ORDER — FUROSEMIDE 10 MG/ML IJ SOLN
40.0000 mg | Freq: Every evening | INTRAMUSCULAR | Status: DC
  Administered 2019-11-19: 40 mg via INTRAVENOUS
  Filled 2019-11-19: qty 4

## 2019-11-19 NOTE — Progress Notes (Signed)
BP this morning 86/67 and Dr. Denton Brick ordered to hold coreg until later in day and that he would adjust lasix.

## 2019-11-19 NOTE — NC FL2 (Addendum)
[place] LEVEL OF CARE SCREENING TOOL     IDENTIFICATION  Patient Name: Barbara Stone Birthdate: 16-Dec-1935 Sex: female Admission Date (Current Location): 11/16/2019  [facility] and [place] Number:  [employer] and Address:  [place] [address] [place]      Provider Number:    Attending Physician Name and Address:  Roxan Hockey, MD  Relative Name and Phone Number:  Barbara Stone (Daughter) [phone]    Current Level of Care: Hospital Recommended Level of Care: [place] Prior Approval Number:    Date Approved/Denied: 11/19/19 PASRR Number: [id number]  Discharge Plan:      Current Diagnoses: Patient Active Problem List   Diagnosis Date Noted  . Type 2 diabetes mellitus with hypoglycemia without coma ([place]) 11/17/2019  . Syncope and collapse---Due to Hypoglycemia 11/17/2019  . Acute exacerbation of CHF (congestive heart failure) ([place]) 08/03/2018  . Acute on chronic HFrEF (heart failure with reduced ejection fraction)/EF 25 % 08/03/2018  . Type 2 diabetes mellitus with stage 4 chronic kidney disease ([place]) 08/03/2018  . Anticoagulant long-term use/Coumadin for Afib 08/03/2018  . Cellulitis of left hand 02/04/2018  . Acute on chronic systolic (congestive) heart failure ([place]) 06/27/2017  . Acute-on-chronic kidney injury ([place]) 06/27/2017  . Community acquired pneumonia 06/27/2017  . Diabetes mellitus without complication ([place])   . IDA (iron deficiency anemia) 12/14/2016  . Acute respiratory failure with hypoxemia ([place]) 03/19/2016  . Chronic systolic CHF (congestive heart failure) ([place]) 03/19/2016  . HTN (hypertension) 03/19/2016  . Chronic kidney disease (CKD), stage IV (severe) ([place]) 03/19/2016  . Atrial fibrillation ([place]) [I48.91] 03/09/2016  . Encounter for therapeutic drug monitoring 03/09/2016    Orientation RESPIRATION BLADDER Height & Weight     Self, Time, Situation, Place   Normal External catheter Weight: 195 lb 1.7 oz (88.5 kg) Height:  5\' 6"  (167.6 cm)  BEHAVIORAL SYMPTOMS/MOOD NEUROLOGICAL BOWEL NUTRITION STATUS      Continent Diet (Diet heart healthy/carb modified Room service appropriate? Yes; Fluid consistency: Thin)  AMBULATORY STATUS COMMUNICATION OF NEEDS Skin   Extensive Assist Verbally Other (Comment), Skin abrasions (Face abrasian, Pressure injury 11/18/19 sacrum posterior stage 2 partial thickness loss of dermis presenting as a shallow open injury with a red , pink wound bed without slough. .25 cm x 2.5 cm)                       Personal Care Assistance Level of Assistance  Bathing, Feeding, Dressing Bathing Assistance: Maximum assistance Feeding assistance: Independent Dressing Assistance: Maximum assistance     Functional Limitations Info  Sight, Hearing, Speech Sight Info: Adequate Hearing Info: Impaired Speech Info: Adequate    SPECIAL CARE FACTORS FREQUENCY  PT (By licensed PT)     PT Frequency: 5x per week              Contractures Contractures Info: Not present    Additional Factors Info  Code Status, Allergies, Insulin Sliding Scale Code Status Info: Full Allergies Info: N/A   Insulin Sliding Scale Info: Insulin sliding scale Insulin aspart (novoLOG) injections 0 - 6 units 0-6 units subcutaneous, 3 times daily with meals. First does 11/17/19 at 0800Correction coverage: resistant (obese, steroids) CVG <70: Implement Hypoglycemia Standing Orders and refer to Hypoglycemia Standing Orders sidebar reportCBG 70 - 120: 0 units CBG 121 - 150: 0 units CBG 151 - 250: 1 units CBG 201 - 250: 2 units CBG 251 - 300: 3 units  CBG 301 - 250 : 4 units CBG 351 - 400: 5 units CBG > Give 6 units and call MD       Current Medications (11/19/2019):  This is the current hospital active medication list Current Facility-Administered Medications  Medication Dose Route Frequency Provider Last Rate Last Admin  . 0.9 %  sodium chloride  infusion  250 mL Intravenous PRN Emokpae, Courage, MD      . acetaminophen (TYLENOL) tablet 650 mg  650 mg Oral Q6H PRN Emokpae, Courage, MD       Or  . acetaminophen (TYLENOL) suppository 650 mg  650 mg Rectal Q6H PRN Emokpae, Courage, MD      . aspirin EC tablet 81 mg  81 mg Oral Daily Emokpae, Courage, MD   81 mg at 11/19/19 0832  . bisacodyl (DULCOLAX) suppository 10 mg  10 mg Rectal Daily PRN Emokpae, Courage, MD      . brimonidine (ALPHAGAN) 0.2 % ophthalmic solution 1 drop  1 drop Both Eyes BID Emokpae, Courage, MD   1 drop at 11/19/19 1202   And  . timolol (TIMOPTIC) 0.5 % ophthalmic solution 1 drop  1 drop Both Eyes BID Roxan Hockey, MD   1 drop at 11/19/19 1203  . carvedilol (COREG) tablet 12.5 mg  12.5 mg Oral BID Emokpae, Courage, MD   12.5 mg at 11/19/19 1204  . cholecalciferol (VITAMIN D3) tablet 1,000 Units  1,000 Units Oral Daily Roxan Hockey, MD   1,000 Units at 11/19/19 0832  . ferrous sulfate tablet 325 mg  325 mg Oral BID Roxan Hockey, MD   325 mg at 11/19/19 0832  . furosemide (LASIX) injection 80 mg  80 mg Intravenous Q12H Emokpae, Courage, MD   80 mg at 11/19/19 0830  . insulin aspart (novoLOG) injection 0-5 Units  0-5 Units Subcutaneous QHS Roxan Hockey, MD   2 Units at 11/18/19 2050  . insulin aspart (novoLOG) injection 0-6 Units  0-6 Units Subcutaneous TID WC Emokpae, Courage, MD   1 Units at 11/18/19 1710  . latanoprost (XALATAN) 0.005 % ophthalmic solution 1 drop  1 drop Both Eyes QHS Emokpae, Courage, MD   1 drop at 11/17/19 2304  . LORazepam (ATIVAN) tablet 0.5 mg  0.5 mg Oral Q12H PRN Emokpae, Courage, MD      . metolazone (ZAROXOLYN) tablet 10 mg  10 mg Oral Daily Emokpae, Courage, MD   10 mg at 11/19/19 0832  . ondansetron (ZOFRAN) tablet 4 mg  4 mg Oral Q6H PRN Emokpae, Courage, MD       Or  . ondansetron (ZOFRAN) injection 4 mg  4 mg Intravenous Q6H PRN Emokpae, Courage, MD      . polyethylene glycol (MIRALAX / GLYCOLAX) packet 17 g  17 g Oral  Daily PRN Emokpae, Courage, MD      . potassium chloride SA (KLOR-CON) CR tablet 20 mEq  20 mEq Oral Daily Emokpae, Courage, MD   20 mEq at 11/19/19 0832  . sodium chloride flush (NS) 0.9 % injection 3 mL  3 mL Intravenous Q12H Emokpae, Courage, MD   3 mL at 11/19/19 0845  . sodium chloride flush (NS) 0.9 % injection 3 mL  3 mL Intravenous Q12H Emokpae, Courage, MD   3 mL at 11/19/19 0844  . sodium chloride flush (NS) 0.9 % injection 3 mL  3 mL Intravenous PRN Roxan Hockey, MD   3 mL at 11/17/19 0950  . traMADol (ULTRAM) tablet 50 mg  50 mg Oral Q6H PRN  Roxan Hockey, MD   50 mg at 11/17/19 1118  . traZODone (DESYREL) tablet 50 mg  50 mg Oral QHS PRN Emokpae, Courage, MD      . warfarin (COUMADIN) tablet 5 mg  5 mg Oral ONCE-1600 Emokpae, Courage, MD      . Warfarin - Pharmacist Dosing Inpatient   Does not apply [id number] Roxan Hockey, MD   Given at 11/18/19 1711     Discharge Medications: Please see discharge summary for a list of discharge medications.  Relevant Imaging Results:  Relevant Lab Results:   Additional Information PT SSN [id number]  Natasha Bence, LCSW

## 2019-11-19 NOTE — Progress Notes (Signed)
ANTICOAGULATION CONSULT NOTE -  Pharmacy Consult for Coumadin Indication: atrial fibrillation  No Known Allergies  Patient Measurements: Height: 5\' 6"  (167.6 cm) Weight: 88.5 kg (195 lb 1.7 oz) IBW/kg (Calculated) : 59.3  Vital Signs: Temp: 99 F (37.2 C) (10/24 0525) Temp Source: Oral (10/23 2048) BP: 100/63 (10/24 0843) Pulse Rate: 80 (10/24 0843)  Labs: Recent Labs    11/17/19 0055 11/17/19 0239 11/18/19 0610 11/19/19 0602  HGB 11.2*  --  10.0*  --   HCT 35.6*  --  32.6*  --   PLT 132*  --  130*  --   LABPROT 17.7*  --  18.8* 19.7*  INR 1.5*  --  1.6* 1.7*  CREATININE 2.60*  --  2.81* 2.83*  TROPONINIHS 189* 170*  --   --     Estimated Creatinine Clearance: 17.2 mL/min (A) (by C-G formula based on SCr of 2.83 mg/dL (H)).   Medical History: Past Medical History:  Diagnosis Date  . Anxiety   . Atrial fibrillation ([place])   . Chronic systolic CHF (congestive heart failure) ([place])   . CKD (chronic kidney disease)   . Diabetes mellitus without complication ([place])   . Hypertension     Medications:  See med rec  Assessment: Patient presented to ED due to a fall. She is chronically anticoagulated with coumadin for afib. Pharmacy asked to dose.  INR is subtherapeutic this AM at 1.7, gently trending upward.   Home dose adjusted at clinic to 5mg  on Monday and Thursday.  2.5mg  all other days  Goal of Therapy:  INR 2-3 Monitor platelets by anticoagulation protocol: Yes   Plan:  Coumadin 5mg  po x 1 today Daily PT-INR Monitor for S/S of bleeding  Isac Sarna, BS Vena Austria, BCPS Clinical Pharmacist Pager [phone] 11/19/2019,8:44 AM

## 2019-11-19 NOTE — Progress Notes (Signed)
Patient Demographics:    Barbara Stone, is a 84 y.o. female, DOB - 1935/02/15, CXK:[id number]  Admit date - 11/16/2019   Admitting Physician Bernadette Hoit, DO  Outpatient Primary MD for the patient is Lemmie Evens, MD  LOS - 2   Chief Complaint  Patient presents with  . Fall        Subjective:    Barbara Stone today has no fevers, no emesis,  No chest pain,   BP is soft Denies dizziness shob is improving Voiding well   Assessment  & Plan :    Active Problems:   Acute on chronic systolic (congestive) heart failure (HCC)   Acute on chronic HFrEF (heart failure with reduced ejection fraction)/EF 25 %   Type 2 diabetes mellitus with stage 4 chronic kidney disease (HCC)   Atrial fibrillation (HCC) [I48.91]   HTN (hypertension)   IDA (iron deficiency anemia)   Acute-on-chronic kidney injury ([place])   Acute exacerbation of CHF (congestive heart failure) (HCC)   Anticoagulant long-term use/Coumadin for Afib   Type 2 diabetes mellitus with hypoglycemia without coma ([place])   Syncope and collapse---Due to Hypoglycemia  Brief Summary:- 84 y.o. female with past medical history of chronic systolic CHF ( 97% by repeat echo in 06/2017) - declined ischemic evaluation in the past by review of notes), moderate MR, persistent atrial fibrillation, HTN, Type 2 DM and Stage 4 CKD admitted on 11/17/19 --after syncopal episode in the setting of hypoglycemia and found to have acute on chronic systolic CHF exacerbation as well  A/p 1)HFrEF--acute on chronic systolic dysfunction exacerbation--- last known EF 25%- declined ischemic evaluation in the past, she declines ischemia work-up again today due to concerns about nephrotoxicity of the dye -BNP is 1351 which is higher than recent baseline -Daily weights and fluid input and output management -Baseline weight usually around 200 pounds avoid ACEI/ARB/ARNI due to  renal concerns -Troponin 189 >>170 -Diuresing well, weight is down to 195 pounds from 204 pounds -BP soft so change IV Lasix to 80 mg every morning and 40 mg every afternoon (previously on 80 mg IV twice daily)   2) syncope secondary to hypoglycemia--- patient passed out at home, patient had closed head injury, facial abrasions and contusions, -- blood sugar was 44,   -CT head without acute findings -Hold Amaryl Patient had a blood sugar of 65 this a.m.  3)Generalized weakness and deconditioning--PT eval appreciated recommends SNF rehab,  awaiting insurance authorization for SNF rehab  4) chronic atrial fibrillation--- pharmacy y to manage Coumadin,  - continue Coreg for rate control  5)DM2-A1c 5.4 patient had episode of hypoglycemia, resulting in syncope PTA --hold Amaryl as above #2, Use Novolog/Humalog Sliding scale insulin with Accu-Cheks/Fingersticks as ordered -Patient had a blood sugar of 65 in am on 11/19/19---hemoglobin A1c is 5.4 putting her at risk for recurrent hypoglycemia -Upon discharge patient will need Amaryl   6)CKD IV--creatinine up to 2.8 from  2.6 on admission which is not far from patient's baseline, -, renally adjust medications, avoid nephrotoxic agents / dehydration  / hypotension  7) chronic anemia--currently 10.0 which is higher than patient's recent baseline, -No concerns about bleeding continue to monitor closely  8) multiple comorbid conditions--- Discussed with  patient's son Hedy Camara at bedside --  palliative care consult for goals of care requested  9)HTN--c/n  Coreg, may use IV labetalol as needed elevated BP   Disposition/Need for in-Hospital Stay- patient unable to be discharged at this time due to --congestive heart failure requiring IV diuresis ,syncope secondary to hypoglycemia requiring further monitoring Awaiting transfer to SNF rehab  Status is: Inpatient  Remains inpatient appropriate because:congestive heart failure requiring IV  diuresis ,syncope secondary to hypoglycemia requiring further monitoring   Dispo: The patient is from: Home  Anticipated d/c is to: Home with HH Vs SNF  Anticipated d/c date is: 1 day  Patient currently is not medically stable to d/c. Barriers: Not Clinically Stable- congestive heart failure requiring IV diuresis ,syncope secondary to hypoglycemia requiring further monitoring Code Status : Full  Family Communication:    (patient is alert, awake and coherent)  Discussed with son Hedy Camara Left Voicemail for daughter Ms. [name] at [phone]  Consults  :  na  DVT Prophylaxis  : Coumadin SCDs   Lab Results  Component Value Date   PLT 130 (L) 11/18/2019    Inpatient Medications  Scheduled Meds: . aspirin EC  81 mg Oral Daily  . brimonidine  1 drop Both Eyes BID   And  . timolol  1 drop Both Eyes BID  . carvedilol  12.5 mg Oral BID  . cholecalciferol  1,000 Units Oral Daily  . ferrous sulfate  325 mg Oral BID  . furosemide  40 mg Intravenous QPM  . [START ON 11/20/2019] furosemide  80 mg Intravenous Daily  . insulin aspart  0-5 Units Subcutaneous QHS  . insulin aspart  0-6 Units Subcutaneous TID WC  . latanoprost  1 drop Both Eyes QHS  . metolazone  10 mg Oral Daily  . potassium chloride  20 mEq Oral Daily  . sodium chloride flush  3 mL Intravenous Q12H  . sodium chloride flush  3 mL Intravenous Q12H  . warfarin  5 mg Oral ONCE-1600  . Warfarin - Pharmacist Dosing Inpatient   Does not apply q1600   Continuous Infusions: . sodium chloride     PRN Meds:.sodium chloride, acetaminophen **OR** acetaminophen, bisacodyl, LORazepam, ondansetron **OR** ondansetron (ZOFRAN) IV, polyethylene glycol, sodium chloride flush, traMADol, traZODone    Anti-infectives (From admission, onward)   None        Objective:   Vitals:   11/19/19 0525 11/19/19 0840 11/19/19 0843 11/19/19 1200  BP: 96/66 (!) 86/67 100/63 104/67  Pulse: 77 77 80  78  Resp: 17   16  Temp: 99 F (37.2 C)   98.7 F (37.1 C)  TempSrc:    Oral  SpO2: 100% 100% 100% 100%  Weight:      Height:        Wt Readings from Last 3 Encounters:  11/18/19 88.5 kg  10/31/19 92.6 kg  01/12/19 86.6 kg     Intake/Output Summary (Last 24 hours) at 11/19/2019 1330 Last data filed at 11/19/2019 1000 Gross per 24 hour  Intake 480 ml  Output 750 ml  Net -270 ml    Physical Exam  Gen:- Awake Alert,  In no apparent distress  HEENT:-  subconjunctival hemorrhage,  resolving periorbital ecchymosis Neck-Supple Neck,No JVD,.  Lungs-improved air movement, no wheezing  CV- S1, S2 normal, regular  Abd-  +ve B.Sounds, Abd Soft, No tenderness,    Extremity/Skin:-Improving + pitting edema, pedal pulses present  Psych-affect is appropriate, oriented x3 Neuro-generalized weakness, no new focal deficits, no tremors   Data Review:  Micro Results Recent Results (from the past 240 hour(s))  Respiratory Panel by RT PCR (Flu A&B, Covid) - Nasopharyngeal Swab     Status: None   Collection Time: 11/17/19  4:00 AM   Specimen: Nasopharyngeal Swab  Result Value Ref Range Status   SARS Coronavirus 2 by RT PCR NEGATIVE NEGATIVE Final    Comment: (NOTE) SARS-CoV-2 target nucleic acids are NOT DETECTED.  The SARS-CoV-2 RNA is generally detectable in upper respiratoy specimens during the acute phase of infection. The lowest concentration of SARS-CoV-2 viral copies this assay can detect is 131 copies/mL. A negative result does not preclude SARS-Cov-2 infection and should not be used as the sole basis for treatment or other patient management decisions. A negative result may occur with  improper specimen collection/handling, submission of specimen other than nasopharyngeal swab, presence of viral mutation(s) within the areas targeted by this assay, and inadequate number of viral copies (<131 copies/mL). A negative result must be combined with clinical observations, patient  history, and epidemiological information. The expected result is Negative.  Fact Sheet for Patients:  PinkCheek.be  Fact Sheet for Healthcare Providers:  GravelBags.it  This test is no t yet approved or cleared by the [place] FDA and  has been authorized for detection and/or diagnosis of SARS-CoV-2 by FDA under an Emergency Use Authorization (EUA). This EUA will remain  in effect (meaning this test can be used) for the duration of the COVID-19 declaration under Section 564(b)(1) of the Act, 21 U.S.C. section 360bbb-3(b)(1), unless the authorization is terminated or revoked sooner.     Influenza A by PCR NEGATIVE NEGATIVE Final   Influenza B by PCR NEGATIVE NEGATIVE Final    Comment: (NOTE) The Xpert Xpress SARS-CoV-2/FLU/RSV assay is intended as an aid in  the diagnosis of influenza from Nasopharyngeal swab specimens and  should not be used as a sole basis for treatment. Nasal washings and  aspirates are unacceptable for Xpert Xpress SARS-CoV-2/FLU/RSV  testing.  Fact Sheet for Patients: PinkCheek.be  Fact Sheet for Healthcare Providers: GravelBags.it  This test is not yet approved or cleared by the [place] FDA and  has been authorized for detection and/or diagnosis of SARS-CoV-2 by  FDA under an Emergency Use Authorization (EUA). This EUA will remain  in effect (meaning this test can be used) for the duration of the  Covid-19 declaration under Section 564(b)(1) of the Act, 21  U.S.C. section 360bbb-3(b)(1), unless the authorization is  terminated or revoked. Performed at [facility], [address] [place], [place] [address]     Radiology Reports DG Chest 1 View  Result Date: 11/17/2019 CLINICAL DATA:  Weakness, fall, altered mental status. History of heart disease, diabetes, hypertension EXAM: CHEST  1 VIEW COMPARISON:  08/03/2018 FINDINGS:  Shallow inspiration. Diffuse cardiac enlargement. Perihilar infiltrates likely representing edema. No pleural effusions. No pneumothorax. Calcification of the aorta. Degenerative changes in the spine and shoulders. IMPRESSION: Cardiac enlargement with perihilar edema. Electronically Signed   By: Lucienne Capers M.D.   On: 11/17/2019 00:46   CT Head Wo Contrast  Result Date: 11/17/2019 CLINICAL DATA:  Status post fall. EXAM: CT HEAD WITHOUT CONTRAST TECHNIQUE: Contiguous axial images were obtained from the base of the skull through the vertex without intravenous contrast. COMPARISON:  None. FINDINGS: Brain: There is moderate severity cerebral atrophy with widening of the extra-axial spaces and ventricular dilatation. There are areas of decreased attenuation within the white matter tracts of the supratentorial brain, consistent with microvascular disease changes. Vascular: No  hyperdense vessel or unexpected calcification. Skull: Normal. Negative for fracture or focal lesion. Sinuses/Orbits: No acute finding. Other: There is mild left frontal scalp soft tissue swelling. Mild lateral right periorbital soft tissue swelling is also seen. IMPRESSION: 1. Mild left frontal and right periorbital soft tissue swelling. 2. Generalized cerebral atrophy. 3. No acute intracranial abnormality. Electronically Signed   By: Virgina Norfolk M.D.   On: 11/17/2019 00:35   CT Maxillofacial Wo Contrast  Result Date: 11/17/2019 CLINICAL DATA:  Status post fall. EXAM: CT MAXILLOFACIAL WITHOUT CONTRAST TECHNIQUE: Multidetector CT imaging of the maxillofacial structures was performed. Multiplanar CT image reconstructions were also generated. COMPARISON:  None. FINDINGS: Osseous: No fracture or mandibular dislocation. No destructive process. Orbits: Negative. No traumatic or inflammatory finding. Sinuses: Clear. Soft tissues: There is mild right periorbital soft tissue swelling. Limited intracranial: No significant or unexpected  finding. IMPRESSION: 1. Mild right periorbital soft tissue swelling without evidence of an acute fracture. Electronically Signed   By: Virgina Norfolk M.D.   On: 11/17/2019 00:39     CBC Recent Labs  Lab 11/17/19 0055 11/18/19 0610  WBC 6.7 5.2  HGB 11.2* 10.0*  HCT 35.6* 32.6*  PLT 132* 130*  MCV 99.4 100.9*  MCH 31.3 31.0  MCHC 31.5 30.7  RDW 16.7* 17.0*  LYMPHSABS 0.5*  --   MONOABS 0.6  --   EOSABS 0.4  --   BASOSABS 0.0  --     Chemistries  Recent Labs  Lab 11/17/19 0055 11/18/19 0610 11/19/19 0602  NA 140 141 136  K 3.5 3.3* 4.0  CL 98 99 99  CO2 26 27 26   GLUCOSE 44* 121* 89  BUN 103* 100* 112*  CREATININE 2.60* 2.81* 2.83*  CALCIUM 9.0 8.7* 8.5*  MG  --   --  2.0  AST 39  --   --   ALT 13  --   --   ALKPHOS 102  --   --   BILITOT 2.3*  --   --    ------------------------------------------------------------------------------------------------------------------ No results for input(s): CHOL, HDL, LDLCALC, TRIG, CHOLHDL, LDLDIRECT in the last 72 hours.  Lab Results  Component Value Date   HGBA1C 5.4 11/17/2019   ------------------------------------------------------------------------------------------------------------------ No results for input(s): TSH, T4TOTAL, T3FREE, THYROIDAB in the last 72 hours.  Invalid input(s): FREET3 ------------------------------------------------------------------------------------------------------------------ No results for input(s): VITAMINB12, FOLATE, FERRITIN, TIBC, IRON, RETICCTPCT in the last 72 hours.  Coagulation profile Recent Labs  Lab 11/17/19 0055 11/18/19 0610 11/19/19 0602  INR 1.5* 1.6* 1.7*    No results for input(s): DDIMER in the last 72 hours.  Cardiac Enzymes No results for input(s): CKMB, TROPONINI, MYOGLOBIN in the last 168 hours.  Invalid input(s): CK ------------------------------------------------------------------------------------------------------------------    Component Value  Date/Time   BNP 1,351.0 (H) 11/17/2019 3154     Roxan Hockey M.D on 11/19/2019 at 1:30 PM  Go to [email or web address] - for contact info  [facility] - Office  [phone]

## 2019-11-20 DIAGNOSIS — I4891 Unspecified atrial fibrillation: Secondary | ICD-10-CM | POA: Diagnosis not present

## 2019-11-20 DIAGNOSIS — I10 Essential (primary) hypertension: Secondary | ICD-10-CM | POA: Diagnosis not present

## 2019-11-20 DIAGNOSIS — E1122 Type 2 diabetes mellitus with diabetic chronic kidney disease: Secondary | ICD-10-CM | POA: Diagnosis not present

## 2019-11-20 DIAGNOSIS — I5023 Acute on chronic systolic (congestive) heart failure: Secondary | ICD-10-CM | POA: Diagnosis not present

## 2019-11-20 LAB — RENAL FUNCTION PANEL
Albumin: 3.2 g/dL — ABNORMAL LOW (ref 3.5–5.0)
Anion gap: 11 (ref 5–15)
BUN: 110 mg/dL — ABNORMAL HIGH (ref 8–23)
CO2: 28 mmol/L (ref 22–32)
Calcium: 8.8 mg/dL — ABNORMAL LOW (ref 8.9–10.3)
Chloride: 99 mmol/L (ref 98–111)
Creatinine, Ser: 2.79 mg/dL — ABNORMAL HIGH (ref 0.44–1.00)
GFR, Estimated: 16 mL/min — ABNORMAL LOW (ref >60)
Glucose, Bld: 105 mg/dL — ABNORMAL HIGH (ref 70–99)
Phosphorus: 3.7 mg/dL (ref 2.5–4.6)
Potassium: 3.7 mmol/L (ref 3.5–5.1)
Sodium: 138 mmol/L (ref 135–145)

## 2019-11-20 LAB — CBC
HCT: 31.1 % — ABNORMAL LOW (ref 36.0–46.0)
Hemoglobin: 9.8 g/dL — ABNORMAL LOW (ref 12.0–15.0)
MCH: 31.3 pg (ref 26.0–34.0)
MCHC: 31.5 g/dL (ref 30.0–36.0)
MCV: 99.4 fL (ref 80.0–100.0)
Platelets: 125 10*3/uL — ABNORMAL LOW (ref 150–400)
RBC: 3.13 MIL/uL — ABNORMAL LOW (ref 3.87–5.11)
RDW: 17 % — ABNORMAL HIGH (ref 11.5–15.5)
WBC: 5.7 10*3/uL (ref 4.0–10.5)
nRBC: 0 % (ref 0.0–0.2)

## 2019-11-20 LAB — GLUCOSE, CAPILLARY
Glucose-Capillary: 97 mg/dL (ref 70–99)
Glucose-Capillary: 115 mg/dL — ABNORMAL HIGH (ref 70–99)

## 2019-11-20 LAB — PROTIME-INR
INR: 1.6 — ABNORMAL HIGH (ref 0.8–1.2)
Prothrombin Time: 18.8 seconds — ABNORMAL HIGH (ref 11.4–15.2)

## 2019-11-20 LAB — RESPIRATORY PANEL BY RT PCR (FLU A&B, COVID)
Influenza A by PCR: NEGATIVE
Influenza B by PCR: NEGATIVE
SARS Coronavirus 2 by RT PCR: NEGATIVE

## 2019-11-20 MED ORDER — LORAZEPAM 0.5 MG PO TABS: 0.5000 mg | ORAL_TABLET | Freq: Two times a day (BID) | ORAL | 1 refills | Status: AC | PRN

## 2019-11-20 MED ORDER — WARFARIN SODIUM 5 MG PO TABS: ORAL_TABLET | ORAL | 3 refills | Status: AC

## 2019-11-20 MED ORDER — WARFARIN SODIUM 7.5 MG PO TABS: 7.5000 mg | ORAL_TABLET | Freq: Once | ORAL | Status: DC

## 2019-11-20 MED ORDER — TRAMADOL HCL 50 MG PO TABS: 50.0000 mg | ORAL_TABLET | Freq: Three times a day (TID) | ORAL | 0 refills | Status: AC | PRN

## 2019-11-20 MED ORDER — WARFARIN SODIUM 7.5 MG PO TABS
7.5000 mg | ORAL_TABLET | Freq: Once | ORAL | Status: AC
  Administered 2019-11-20: 7.5 mg via ORAL
  Filled 2019-11-20: qty 1

## 2019-11-20 NOTE — Care Management Important Message (Signed)
Important Message  Patient Details  Name: Barbara Stone MRN: [id number] Date of Birth: May 17, 1935   Medicare Important Message Given:  Yes (RN Vida Roller was asked to deliver form)     Tommy Medal 11/20/2019, 11:55 AM

## 2019-11-20 NOTE — Progress Notes (Signed)
Pt prepared for d/c to SNF. IV d/c'd. Skin intact except as charted in most recent assessments. Vitals are stable. Report called to receiving facility. Pt to be transported by family to [facility] in [place].

## 2019-11-20 NOTE — TOC Transition Note (Signed)
Transition of Care [facility]) - CM/SW Discharge Note   Patient Details  Name: Barbara Stone MRN: [id number] Date of Birth: December 16, 1935  Transition of Care [facility]) CM/SW Contact:  Salome Arnt, LCSW Phone Number: 11/20/2019, 2:15 PM   Clinical Narrative:  Pt d/c today. LCSW spoke with pt's daughter, Glenard Haring regarding d/c plan. She feels pt needs short term SNF and states pt was in agreement yesterday. She is aware [facility] does not have any beds which was first preference. Angel requested referral be sent to [facility] and would like [facility]. [facility] offered bed and can accept pt today. Glenard Haring states family will provide transport. COVID negative 11/20/19. D/C summary sent to facility. Glenard Haring is aware that pt will be responsible for copays after 20 days as pt has [place] Medicaid. Angel plans for pt to return home prior to 20 days. RN given number to call report. LCSW discussed above with pt who was agreeable.      Final next level of care: Skilled Nursing Facility Barriers to Discharge: Barriers Resolved   Patient Goals and CMS Choice Patient states their goals for this hospitalization and ongoing recovery are:: short-term SNF   Choice offered to / list presented to : Adult Children  Discharge Placement              Patient chooses bed at: [facility] Patient to be transferred to facility by: family Name of family member notified: Glenard Haring- daughter Patient and family notified of of transfer: 11/20/19  Discharge Plan and Services                                     Social Determinants of Health (SDOH) Interventions     Readmission Risk Interventions Readmission Risk Prevention Plan 08/05/2018 08/04/2018  Transportation Screening - Complete  PCP or Specialist Appt within 3-5 Days Complete -  HRI or Home Care Consult Patient refused Complete  Social Work Consult for [place] Planning/Counseling - Complete  Palliative Care Screening - Not Applicable   Medication Review Press photographer) - Complete  Some recent data might be hidden

## 2019-11-20 NOTE — Discharge Summary (Addendum)
Barbara Stone, is a 84 y.o. female  DOB Dec 30, 1935  MRN [id number].  Admission date:  11/16/2019  Admitting Physician  Bernadette Hoit, DO  Discharge Date:  11/20/2019   Primary MD  Lemmie Evens, MD  Recommendations for primary care physician for things to follow:   1)Take Coumadin Take 5mg  on Monday, Wednesday  and Friday, then 2.5mg  on TTSS 2)-Repeat PT/INR on Wednesday 11/22/19 3)Stop Amaryl/Glimeperide due to Low Blood sugars-- please check fingersticks/accuchecks before meals (3 times a day) 4)You are taking coumadin which is a blood thinner so Avoid ibuprofen/Advil/Aleve/Motrin/Goody Powders/Naproxen/BC powders/Meloxicam/Diclofenac/Indomethacin and other Nonsteroidal anti-inflammatory medications as these will make you more likely to bleed and can cause stomach ulcers, can also cause Kidney problems.  5)Repeat CBC and BMP on Friday 11/24/19  Admission Diagnosis  Acute exacerbation of CHF (congestive heart failure) ([place]) [I50.9] Contusion of face, initial encounter [S00.83XA] Fall, initial encounter [W19.XXXA] Acute on chronic congestive heart failure, unspecified heart failure type ([place]) [I50.9]   Discharge Diagnosis  Acute exacerbation of CHF (congestive heart failure) ([place]) [I50.9] Contusion of face, initial encounter [S00.83XA] Fall, initial encounter [W19.XXXA] Acute on chronic congestive heart failure, unspecified heart failure type ([place]) [I50.9]    Active Problems:   Acute on chronic systolic (congestive) heart failure (HCC)   Acute on chronic HFrEF (heart failure with reduced ejection fraction)/EF 25 %   Type 2 diabetes mellitus with stage 4 chronic kidney disease (HCC)   Atrial fibrillation (HCC) [I48.91]   HTN (hypertension)   IDA (iron deficiency anemia)   Acute-on-chronic kidney injury ([place])   Acute exacerbation of CHF (congestive heart failure) (HCC)   Anticoagulant long-term  use/Coumadin for Afib   Type 2 diabetes mellitus with hypoglycemia without coma ([place])   Syncope and collapse---Due to Hypoglycemia      Past Medical History:  Diagnosis Date  . Anxiety   . Atrial fibrillation ([place])   . Chronic systolic CHF (congestive heart failure) ([place])   . CKD (chronic kidney disease)   . Diabetes mellitus without complication ([place])   . Hypertension     Past Surgical History:  Procedure Laterality Date  . BACK SURGERY    . EYE SURGERY Left      HPI  from the history and physical done on the day of admission:    Lai Hendriks  is a 84 y.o. female with past medical history of chronic systolic CHF ( 56% by repeat echo in 06/2017) - declined ischemic evaluation in the past by review of notes), moderate MR, persistent atrial fibrillation, HTN, Type 2 DM and Stage 4 CKD who presented to the ED after she passed out at home, patient had close head injury, facial abrasions and contusions, -- blood sugar was 44, -- -CT head without acute findings -Patient is found to also have shortness of breath and dyspnea on exertion chest x-ray consistent with CHF, BNP elevated at 1351 -troponins are flat at 189 and 170 -Additional history obtained from patient's son Hedy Camara at bedside -Creatinine Around 2.6 which is not far  from patient's baseline -INR 1.5 --Patient denies frank chest pains, no focal weakness -No concerns about seizures no incontinence no tongue biting -In ED patient remains very weak and unsteady with difficulty with ambulation     Hospital Course:   Brief Summary:- 84 y.o.femalewith past medical history of chronic systolic ZMO(29% by repeat echo in 06/2017)- declined ischemic evaluation in the past by review of notes), moderate MR, persistent atrial fibrillation, HTN, Type 2 DM and Stage 4 CKD admitted on 11/17/19 --after syncopal episode in the setting of hypoglycemia and found to have acute on chronic systolic CHF exacerbation as  well  A/p 1)HFrEF--acute on chronic systolic dysfunction exacerbation--- lastknown EF 25%-declined ischemic evaluation in the past,she declines ischemia work-up again today due to concerns about nephrotoxicity of the dye -BNP is 1351 which is higher than recent baseline -Daily weights and fluid input and output management -Baseline weight usually around 200 pounds avoid ACEI/ARB/ARNI due to renal concerns -Troponin189>>170 -Diuresed well with IV Lasix,  -weight is down to 195 pounds from 204 pounds -Okay to discharge on p.o. Torsemide and metolazone   2)Syncope secondary to hypoglycemia---patient passed out at home, patient had closed head injury, facial abrasions and contusions, --blood sugar was 44,  while in the hospital patient had episodes of borderline hypoglycemia -Accu-Cheks/fingerstick check 3 times daily advised -CT head without acute findings -  Amaryl has been discontinued  3)Generalized weakness and deconditioning--PT eval appreciated recommends SNF rehab, awaiting insurance authorization for SNF rehab  4)Chronic atrial fibrillation----continue Coreg for rate control -INR subtherapeutic Coumadin changed to 5 mg Mondays Wednesdays and Fridays, 2.5 mg other days -Recheck PT/INR on Wednesday, 11/22/2019  5)DM2-A1c 5.4 patient had episode of hypoglycemia, resulting in syncopePTA --Discontinued Amaryl as above #2, -Accu-Cheks/Fingersticks AC meals as advised -Patient had a blood sugar of 65 in am on 11/19/19---hemoglobin A1c is 5.4 putting her at risk for recurrent hypoglycemia   6)CKD IV--creatinine up to 2.8 from  2.6 on admission which is not far from patient's baseline, -, renally adjust medications, avoid nephrotoxic agents / dehydration / hypotension  7)chronic anemia--currently around 10 which is higher than patient's recent baseline, -No concerns about bleeding continue to monitor closely -I see no indication for her aspirin while on  Coumadin  8)multiple comorbid conditions---Discussed withpatient's son Raynelle Bring --palliative care consult for goals of care requested  9)HTN--c/n  Coreg, , torsemide and metolazone  10)Generalized weakness/deconditioning and ambulatory dysfunction--  Disposition --discharge to to SNF rehab   Dispo: The patient is from:Home Anticipated d/c is to: SNF   Code Status : Full  Family Communication:    (patient is alert, awake and coherent)  Discussed with son Hedy Camara Left Voicemail for daughter Ms. [name] at [phone] also left voicemail for daughter Ms Glenard Haring WSFKCLE--[phone]  Discharge Condition: stable  Follow UP-- PCP  Contact information for after-discharge care    [facility] .   Service: Skilled Nursing Contact information: [place] [phone]                  Consults obtained - na  Diet and Activity recommendation:  As advised  Discharge Instructions    Discharge Instructions    Call MD for:  difficulty breathing, headache or visual disturbances   Complete by: As directed    Call MD for:  persistant dizziness or light-headedness   Complete by: As directed    Call MD for:  persistant nausea and vomiting  Complete by: As directed    Call MD for:  severe uncontrolled pain   Complete by: As directed    Call MD for:  temperature >100.4   Complete by: As directed    Diet - low sodium heart healthy   Complete by: As directed    Discharge instructions   Complete by: As directed    1)Take Coumadin Take 5mg  on Monday, Wednesday  and Friday, then 2.5mg  on TTSS 2)-Repeat PT/INR on Wednesday 11/22/19 3)Stop Amaryl/Glimeperide due to Low Blood sugars-- please check fingersticks/accuchecks before meals (3 times a day) 4)You are taking coumadin which is a blood thinner so Avoid ibuprofen/Advil/Aleve/Motrin/Goody Powders/Naproxen/BC  powders/Meloxicam/Diclofenac/Indomethacin and other Nonsteroidal anti-inflammatory medications as these will make you more likely to bleed and can cause stomach ulcers, can also cause Kidney problems.  5)Repeat CBC and BMP on Friday 11/24/19   Discharge wound care:   Complete by: As directed    As advised   Increase activity slowly   Complete by: As directed         Discharge Medications     Allergies as of 11/20/2019   No Known Allergies     Medication List    STOP taking these medications   aspirin EC 81 MG tablet   glimepiride 1 MG tablet Commonly known as: AMARYL     TAKE these medications   acetaminophen 325 MG tablet Commonly known as: TYLENOL Take 2 tablets (650 mg total) by mouth every 6 (six) hours as needed for mild pain or fever (or Fever >/= 101).   allopurinol 100 MG tablet Commonly known as: ZYLOPRIM Take 100 mg by mouth daily.   bimatoprost 0.03 % ophthalmic solution Commonly known as: LUMIGAN Place 1 drop into both eyes at bedtime.   carvedilol 12.5 MG tablet Commonly known as: COREG Take 1 tablet (12.5 mg total) by mouth 2 (two) times daily.   Combigan 0.2-0.5 % ophthalmic solution Generic drug: brimonidine-timolol Place 1 drop into both eyes 2 (two) times daily.   Ear Drops 6.5 % OTIC solution Generic drug: carbamide peroxide   FeroSul 325 (65 FE) MG tablet Generic drug: ferrous sulfate Take 1 tablet by mouth 2 (two) times daily.   LORazepam 0.5 MG tablet Commonly known as: ATIVAN Take 1 tablet (0.5 mg total) by mouth every 12 (twelve) hours as needed for anxiety or sleep. What changed:   when to take this  reasons to take this   metolazone 5 MG tablet Commonly known as: ZAROXOLYN Take 2 tablets (10 mg total) by mouth daily.   Potassium Chloride ER 20 MEQ Tbcr Take 20 mEq by mouth See admin instructions. Take 2 tablets (40 meq)  every M/W/F, take 1 tablet (20 Meq) every T/T/S/S   SSD 1 % cream Generic drug: silver  sulfADIAZINE Apply 1 application topically daily.   torsemide 100 MG tablet Commonly known as: DEMADEX Take 1 tablet (100 mg total) by mouth 2 (two) times daily.   traMADol 50 MG tablet Commonly known as: ULTRAM Take 1 tablet (50 mg total) by mouth every 8 (eight) hours as needed for severe pain. What changed: when to take this   Travatan Z 0.004 % Soln ophthalmic solution Generic drug: Travoprost (BAK Free) Place 1 drop into both eyes at bedtime.   triamcinolone ointment 0.1 % Commonly known as: KENALOG Apply 1 application topically daily.   VITAMIN D PO Take 1 tablet by mouth daily.   warfarin 5 MG tablet Commonly known as: COUMADIN Take as directed. If  you are unsure how to take this medication, talk to your nurse or doctor. Original instructions: Take 5mg  on Monday, Wednesday  and Friday, then 2.5mg  on TTSS What changed: additional instructions            Discharge Care Instructions  (From admission, onward)         Start     Ordered   11/20/19 0000  Discharge wound care:       Comments: As advised   11/20/19 1245         Major procedures and Radiology Reports - PLEASE review detailed and final reports for all details, in brief -   DG Chest 1 View  Result Date: 11/17/2019 CLINICAL DATA:  Weakness, fall, altered mental status. History of heart disease, diabetes, hypertension EXAM: CHEST  1 VIEW COMPARISON:  08/03/2018 FINDINGS: Shallow inspiration. Diffuse cardiac enlargement. Perihilar infiltrates likely representing edema. No pleural effusions. No pneumothorax. Calcification of the aorta. Degenerative changes in the spine and shoulders. IMPRESSION: Cardiac enlargement with perihilar edema. Electronically Signed   By: Lucienne Capers M.D.   On: 11/17/2019 00:46   CT Head Wo Contrast  Result Date: 11/17/2019 CLINICAL DATA:  Status post fall. EXAM: CT HEAD WITHOUT CONTRAST TECHNIQUE: Contiguous axial images were obtained from the base of the skull through  the vertex without intravenous contrast. COMPARISON:  None. FINDINGS: Brain: There is moderate severity cerebral atrophy with widening of the extra-axial spaces and ventricular dilatation. There are areas of decreased attenuation within the white matter tracts of the supratentorial brain, consistent with microvascular disease changes. Vascular: No hyperdense vessel or unexpected calcification. Skull: Normal. Negative for fracture or focal lesion. Sinuses/Orbits: No acute finding. Other: There is mild left frontal scalp soft tissue swelling. Mild lateral right periorbital soft tissue swelling is also seen. IMPRESSION: 1. Mild left frontal and right periorbital soft tissue swelling. 2. Generalized cerebral atrophy. 3. No acute intracranial abnormality. Electronically Signed   By: Virgina Norfolk M.D.   On: 11/17/2019 00:35   CT Maxillofacial Wo Contrast  Result Date: 11/17/2019 CLINICAL DATA:  Status post fall. EXAM: CT MAXILLOFACIAL WITHOUT CONTRAST TECHNIQUE: Multidetector CT imaging of the maxillofacial structures was performed. Multiplanar CT image reconstructions were also generated. COMPARISON:  None. FINDINGS: Osseous: No fracture or mandibular dislocation. No destructive process. Orbits: Negative. No traumatic or inflammatory finding. Sinuses: Clear. Soft tissues: There is mild right periorbital soft tissue swelling. Limited intracranial: No significant or unexpected finding. IMPRESSION: 1. Mild right periorbital soft tissue swelling without evidence of an acute fracture. Electronically Signed   By: Virgina Norfolk M.D.   On: 11/17/2019 00:39    Micro Results   Recent Results (from the past 240 hour(s))  Respiratory Panel by RT PCR (Flu A&B, Covid) - Nasopharyngeal Swab     Status: None   Collection Time: 11/17/19  4:00 AM   Specimen: Nasopharyngeal Swab  Result Value Ref Range Status   SARS Coronavirus 2 by RT PCR NEGATIVE NEGATIVE Final    Comment: (NOTE) SARS-CoV-2 target nucleic acids  are NOT DETECTED.  The SARS-CoV-2 RNA is generally detectable in upper respiratoy specimens during the acute phase of infection. The lowest concentration of SARS-CoV-2 viral copies this assay can detect is 131 copies/mL. A negative result does not preclude SARS-Cov-2 infection and should not be used as the sole basis for treatment or other patient management decisions. A negative result may occur with  improper specimen collection/handling, submission of specimen other than nasopharyngeal swab, presence of viral mutation(s) within  the areas targeted by this assay, and inadequate number of viral copies (<131 copies/mL). A negative result must be combined with clinical observations, patient history, and epidemiological information. The expected result is Negative.  Fact Sheet for Patients:  PinkCheek.be  Fact Sheet for Healthcare Providers:  GravelBags.it  This test is no t yet approved or cleared by the [place] FDA and  has been authorized for detection and/or diagnosis of SARS-CoV-2 by FDA under an Emergency Use Authorization (EUA). This EUA will remain  in effect (meaning this test can be used) for the duration of the COVID-19 declaration under Section 564(b)(1) of the Act, 21 U.S.C. section 360bbb-3(b)(1), unless the authorization is terminated or revoked sooner.     Influenza A by PCR NEGATIVE NEGATIVE Final   Influenza B by PCR NEGATIVE NEGATIVE Final    Comment: (NOTE) The Xpert Xpress SARS-CoV-2/FLU/RSV assay is intended as an aid in  the diagnosis of influenza from Nasopharyngeal swab specimens and  should not be used as a sole basis for treatment. Nasal washings and  aspirates are unacceptable for Xpert Xpress SARS-CoV-2/FLU/RSV  testing.  Fact Sheet for Patients: PinkCheek.be  Fact Sheet for Healthcare Providers: GravelBags.it  This test is not  yet approved or cleared by the [place] FDA and  has been authorized for detection and/or diagnosis of SARS-CoV-2 by  FDA under an Emergency Use Authorization (EUA). This EUA will remain  in effect (meaning this test can be used) for the duration of the  Covid-19 declaration under Section 564(b)(1) of the Act, 21  U.S.C. section 360bbb-3(b)(1), unless the authorization is  terminated or revoked. Performed at [facility], [address] [place], [place] [address]   Respiratory Panel by RT PCR (Flu A&B, Covid) - Nasopharyngeal Swab     Status: None   Collection Time: 11/20/19 11:34 AM   Specimen: Nasopharyngeal Swab  Result Value Ref Range Status   SARS Coronavirus 2 by RT PCR NEGATIVE NEGATIVE Final    Comment: (NOTE) SARS-CoV-2 target nucleic acids are NOT DETECTED.  The SARS-CoV-2 RNA is generally detectable in upper respiratoy specimens during the acute phase of infection. The lowest concentration of SARS-CoV-2 viral copies this assay can detect is 131 copies/mL. A negative result does not preclude SARS-Cov-2 infection and should not be used as the sole basis for treatment or other patient management decisions. A negative result may occur with  improper specimen collection/handling, submission of specimen other than nasopharyngeal swab, presence of viral mutation(s) within the areas targeted by this assay, and inadequate number of viral copies (<131 copies/mL). A negative result must be combined with clinical observations, patient history, and epidemiological information. The expected result is Negative.  Fact Sheet for Patients:  PinkCheek.be  Fact Sheet for Healthcare Providers:  GravelBags.it  This test is no t yet approved or cleared by the [place] FDA and  has been authorized for detection and/or diagnosis of SARS-CoV-2 by FDA under an Emergency Use Authorization (EUA). This EUA will remain  in effect  (meaning this test can be used) for the duration of the COVID-19 declaration under Section 564(b)(1) of the Act, 21 U.S.C. section 360bbb-3(b)(1), unless the authorization is terminated or revoked sooner.     Influenza A by PCR NEGATIVE NEGATIVE Final   Influenza B by PCR NEGATIVE NEGATIVE Final    Comment: (NOTE) The Xpert Xpress SARS-CoV-2/FLU/RSV assay is intended as an aid in  the diagnosis of influenza from Nasopharyngeal swab specimens and  should not be used as a  sole basis for treatment. Nasal washings and  aspirates are unacceptable for Xpert Xpress SARS-CoV-2/FLU/RSV  testing.  Fact Sheet for Patients: PinkCheek.be  Fact Sheet for Healthcare Providers: GravelBags.it  This test is not yet approved or cleared by the [place] FDA and  has been authorized for detection and/or diagnosis of SARS-CoV-2 by  FDA under an Emergency Use Authorization (EUA). This EUA will remain  in effect (meaning this test can be used) for the duration of the  Covid-19 declaration under Section 564(b)(1) of the Act, 21  U.S.C. section 360bbb-3(b)(1), unless the authorization is  terminated or revoked. Performed at [facility], [address] [place],  [address]     Today   Subjective    Taiana Temkin today has no new complaints -Voiding okay -Eating okay -No chest pains no palpitations no dizziness no increased shortness of breath        Patient has been seen and examined prior to discharge   Objective   Blood pressure 106/60, pulse 64, temperature 98.1 F (36.7 C), temperature source Oral, resp. rate 18, height 5\' 6"  (1.676 m), weight 88.5 kg, SpO2 100 %.   Intake/Output Summary (Last 24 hours) at 11/20/2019 1609 Last data filed at 11/19/2019 1735 Gross per 24 hour  Intake 480 ml  Output 300 ml  Net 180 ml    Exam Gen:- Awake Alert,  In no apparent distress  HEENT:-Resolving subconjunctival hemorrhage,  resolving periorbital ecchymosis Neck-Supple Neck,No JVD,.  Lungs-improved air movement, no wheezing  CV- S1, S2 normal, regular  Abd-  +ve B.Sounds, Abd Soft, No tenderness,    Extremity/Skin:-Improving + pitting edema, pedal pulses present  Psych-affect is appropriate, oriented x3 Neuro-generalized weakness, no new focal deficits, no tremors   Data Review   CBC w Diff:  Lab Results  Component Value Date   WBC 5.7 11/20/2019   HGB 9.8 (L) 11/20/2019   HCT 31.1 (L) 11/20/2019   PLT 125 (L) 11/20/2019   LYMPHOPCT 7 11/17/2019   MONOPCT 10 11/17/2019   EOSPCT 5 11/17/2019   BASOPCT 0 11/17/2019    CMP:  Lab Results  Component Value Date   NA 138 11/20/2019   K 3.7 11/20/2019   CL 99 11/20/2019   CO2 28 11/20/2019   BUN 110 (H) 11/20/2019   CREATININE 2.79 (H) 11/20/2019   CREATININE 1.76 (H) 02/03/2017   PROT 7.1 11/17/2019   ALBUMIN 3.2 (L) 11/20/2019   BILITOT 2.3 (H) 11/17/2019   ALKPHOS 102 11/17/2019   AST 39 11/17/2019   ALT 13 11/17/2019  .   Total Discharge time is about 33 minutes  Roxan Hockey M.D on 11/20/2019 at 4:09 PM  Go to [email or web address] -  for contact info  [facility] - Office  [phone]

## 2019-11-20 NOTE — Progress Notes (Signed)
ANTICOAGULATION CONSULT NOTE -  Pharmacy Consult for Coumadin Indication: atrial fibrillation  No Known Allergies  Patient Measurements: Height: 5\' 6"  (167.6 cm) Weight: 88.5 kg (195 lb 1.7 oz) IBW/kg (Calculated) : 59.3  Vital Signs: Temp: 99.2 F (37.3 C) (10/25 0540) Temp Source: Oral (10/25 0540) BP: 104/58 (10/25 0540) Pulse Rate: 73 (10/25 0540)  Labs: Recent Labs    11/18/19 0610 11/19/19 0602 11/20/19 0729  HGB 10.0*  --  9.8*  HCT 32.6*  --  31.1*  PLT 130*  --  125*  LABPROT 18.8* 19.7* 18.8*  INR 1.6* 1.7* 1.6*  CREATININE 2.81* 2.83* 2.79*    Estimated Creatinine Clearance: 17.4 mL/min (A) (by C-G formula based on SCr of 2.79 mg/dL (H)).   Medical History: Past Medical History:  Diagnosis Date  . Anxiety   . Atrial fibrillation ([place])   . Chronic systolic CHF (congestive heart failure) ([place])   . CKD (chronic kidney disease)   . Diabetes mellitus without complication ([place])   . Hypertension     Medications:  See med rec  Assessment: Patient presented to ED due to a fall. She is chronically anticoagulated with coumadin for afib. Pharmacy asked to dose.  INR is subtherapeutic this AM at 1.6, will give a little booster dose today   Home dose adjusted at clinic to 5mg  on Monday and Thursday.  2.5mg  all other days  Goal of Therapy:  INR 2-3 Monitor platelets by anticoagulation protocol: Yes   Plan:  Coumadin 7.5mg  po x 1 today Daily PT-INR Monitor for S/S of bleeding  Isac Sarna, BS Vena [place], BCPS Clinical Pharmacist Pager [phone] 11/20/2019,11:20 AM

## 2019-11-20 NOTE — Progress Notes (Signed)
PMT consult received and chart reviewed. Discussed with Dr. Denton Brick. No family at bedside. Patient is awake, alert. Sitting up in chair with no complaints. No family at bedside. VM left for daughter, Ivin Booty to discuss goals of care. Awaiting return call. Thank you.   NO CHARGE  Ihor Dow, [place], FNP-C Palliative Medicine Team  Phone: [phone] Fax: [phone]

## 2019-11-20 NOTE — Progress Notes (Addendum)
Physical Therapy Treatment Patient Details Name: Barbara Stone MRN: [id number] DOB: 19-Dec-1935 Today's Date: 11/20/2019    History of Present Illness Barbara Stone is a 84 y.o. female.  Patient is an 84 year old female with past medical history of congestive heart failure, atrial fibrillation on Coumadin, chronic renal insufficiency, diabetes, hypertension. Patient lives at home with her son-in-law. Patient apparently fell getting up to go to the bathroom. She tells me that she became weak and ended up on the floor. She has abrasions to the right eye and bridge of her nose, but does not recall hitting her head. She denies to me she is experiencing any pain. She denies headache, neck pain, visual disturbances, chest pain, abdominal pain, or extremity pain.    PT Comments    Pt very hard of hearing; does better if you physically show pt what you want her to do and then she will return Demonstration.  Pt continues to move very slowly and will need continued rehab in order to go home safely.   Follow Up Recommendations  SNF     Equipment Recommendations  Rolling walker with 5" wheels    Recommendations for Other Services       Precautions / Restrictions Precautions Precautions: Fall Restrictions Weight Bearing Restrictions: No    Mobility  Bed Mobility Overal bed mobility: Needs Assistance Bed Mobility: Supine to Sit;Sit to Supine     Supine to sit: Min assist        Transfers Overall transfer level: Needs assistance   Transfers: Sit to/from Stand;Stand Pivot Transfers Sit to Stand: Supervision Stand pivot transfers: Min guard       General transfer comment: slow labored movement         Cognition Arousal/Alertness: Awake/alert Behavior During Therapy: WFL for tasks assessed/performed Overall Cognitive Status: Within Functional Limits for tasks assessed                                        Exercises General Exercises - Lower  Extremity Ankle Circles/Pumps: AAROM;Both;10 reps Heel Slides: Both;10 reps;AAROM Hip ABduction/ADduction: AAROM;Both;10 reps Straight Leg Raises: Both;5 reps;AAROM Sit to stand x 5 reps    General Comments        Pertinent Vitals/Pain Pain Assessment: Faces Pain Score: 3  Pain Location: feet; statees that she has had problems with her feet for years and is under MD care for it. Pain Descriptors / Indicators: Aching    Home Living Family/patient expects to be discharged to:: Private residence Living Arrangements: Other relatives Available Help at Discharge: Family;Available PRN/intermittently Type of Home: House       Home Equipment: Cane - single point;Grab bars - tub/shower      Prior Function Level of Independence: Independent with assistive device(s)      Comments: household ambulator using [place]   PT Goals (current goals can now be found in the care plan section) Acute Rehab PT Goals Patient Stated Goal: Return home after rehab PT Goal Formulation: With patient/family Time For Goal Achievement: 12/01/19 Potential to Achieve Goals: Good    Frequency    Min 3X/week      PT Plan  SNF       AM-PAC PT "6 Clicks" Mobility   Outcome Measure  Help needed turning from your back to your side while in a flat bed without using bedrails?: A Little Help needed moving from lying on your back  to sitting on the side of a flat bed without using bedrails?: A Little Help needed moving to and from a bed to a chair (including a wheelchair)?: A Little Help needed standing up from a chair using your arms (e.g., wheelchair or bedside chair)?: A Little Help needed to walk in hospital room?: A Lot Help needed climbing 3-5 steps with a railing? : Total 6 Click Score: 15    End of Session   Activity Tolerance: Patient tolerated treatment well;Patient limited by fatigue Patient left: in bed;with call bell/phone within reach;with family/visitor present Nurse Communication:  Mobility status PT Visit Diagnosis: Unsteadiness on feet (R26.81);Other abnormalities of gait and mobility (R26.89);Muscle weakness (generalized) (M62.81)     Time: 8721-5872 PT Time Calculation (min) (ACUTE ONLY): 40 min  Charges:  $Therapeutic Exercise: 23-37 mins $Therapeutic Activity: 8-22 mins                       Rayetta Humphrey, PT CLT [phone] 11/20/2019, 9:46 AM

## 2019-11-20 NOTE — Discharge Instructions (Signed)
1)Take Coumadin Take 5mg  on Monday, Wednesday  and Friday, then 2.5mg  on TTSS 2)-Repeat PT/INR on Wednesday 11/22/19 3)Stop Amaryl/Glimeperide due to Low Blood sugars-- please check fingersticks/accuchecks before meals (3 times a day) 4)You are taking coumadin which is a blood thinner so Avoid ibuprofen/Advil/Aleve/Motrin/Goody Powders/Naproxen/BC powders/Meloxicam/Diclofenac/Indomethacin and other Nonsteroidal anti-inflammatory medications as these will make you more likely to bleed and can cause stomach ulcers, can also cause Kidney problems.  5)Repeat CBC and BMP on Friday 11/24/19

## 2019-11-20 NOTE — Progress Notes (Signed)
Report given to Mickel Baas, LPN at [facility] and rehab.

## 2019-12-19 ENCOUNTER — Inpatient Hospital Stay (HOSPITAL_COMMUNITY)
Admission: AD | Admit: 2019-12-19 | Payer: Medicare Other | Source: Other Acute Inpatient Hospital | Admitting: Family Medicine

## 2019-12-19 NOTE — Progress Notes (Addendum)
   84 y.o.femalewith past medical history of chronic systolic FDV(44% by repeat echo in 06/2017)- declined ischemic evaluation in the past by review of notes), moderate MR, HTN, DM2, persistent atrial fibrillation on Coumadin for anticoagulation, Stage 4 CKDand chronic anemia  --- Accepted to telemetry bed pending Covid test --  Zoe Creasman 06/10/1935 Pt at [facility] with GI Bleed    -- --[facility] and [place] spoke with on-call GI physician Dr. Jenetta Downer who requested that he be called for official consult when  patient Gets here  -- -Addendum--/Update 1629 PM Pt will Now go [place]--- Admitting Hospitalist to call on -call Gi when Pt arrives at [facility] campus from [facility], [place]

## 2019-12-27 ENCOUNTER — Ambulatory Visit (HOSPITAL_COMMUNITY): Payer: Medicare Other

## 2020-01-01 NOTE — Progress Notes (Deleted)
Cardiology Office Note    Date:  01/01/2020   ID:  Barbara Stone, DOB February 03, 1935, MRN [id number]  PCP:  Lemmie Evens, MD  Cardiologist: Kate Sable, MD (Inactive)    No chief complaint on file.   History of Present Illness:    Barbara Stone is a 84 y.o. female with past medical history of chronic systolic CHF (EF 25-42% in 06/2017, at 25% by repeat echo in 06/2017 - declined ischemic evaluation in the past by review of notes), moderate MR, persistent atrial fibrillation, HTN, Type 2 DM and Stage 4 CKD who presents to the office today for 46-month follow-up.   She was last examined by myself in 10/2019 and reported worsening lower extremity edema and diuretic therapy had recently been titrated by Nephrology to Torsemide 100mg  BID and Metolazone 10mg  daily. The patient was unsure of what she was currently taking and her daughter was strongly encouraged to review and verify her medications upon returning home. She was continued on Coreg for her cardiomyopathy as BP did not allow for Hydralazine or Nitrates.   In the interim, she was admitted to [facility] from 10/21 - 11/20/2019 after suffering a fall at home and found to be hypoglycemic on admission. She was treated for a CHF exacerbation with IV Lasix with weight down to 195 lbs at discharge. She was discharged to SNF for rehab.     Past Medical History:  Diagnosis Date  . Anxiety   . Atrial fibrillation ([place])   . Chronic systolic CHF (congestive heart failure) ([place])   . CKD (chronic kidney disease)   . Diabetes mellitus without complication ([place])   . Hypertension     Past Surgical History:  Procedure Laterality Date  . BACK SURGERY    . EYE SURGERY Left     Current Medications: Outpatient Medications Prior to Visit  Medication Sig Dispense Refill  . acetaminophen (TYLENOL) 325 MG tablet Take 2 tablets (650 mg total) by mouth every 6 (six) hours as needed for mild pain or fever (or Fever >/= 101). 15 tablet 1  .  allopurinol (ZYLOPRIM) 100 MG tablet Take 100 mg by mouth daily.    . bimatoprost (LUMIGAN) 0.03 % ophthalmic solution Place 1 drop into both eyes at bedtime.    . carvedilol (COREG) 12.5 MG tablet Take 1 tablet (12.5 mg total) by mouth 2 (two) times daily. 60 tablet 2  . Cholecalciferol (VITAMIN D PO) Take 1 tablet by mouth daily.    . COMBIGAN 0.2-0.5 % ophthalmic solution Place 1 drop into both eyes 2 (two) times daily.  6  . EAR DROPS 6.5 % OTIC solution     . FEROSUL 325 (65 Fe) MG tablet Take 1 tablet by mouth 2 (two) times daily.  11  . LORazepam (ATIVAN) 0.5 MG tablet Take 1 tablet (0.5 mg total) by mouth every 12 (twelve) hours as needed for anxiety or sleep. 15 tablet 1  . metolazone (ZAROXOLYN) 5 MG tablet Take 2 tablets (10 mg total) by mouth daily. 90 tablet 3  . Potassium Chloride ER 20 MEQ TBCR Take 20 mEq by mouth See admin instructions. Take 2 tablets (40 meq)  every M/W/F, take 1 tablet (20 Meq) every T/T/S/S 50 tablet 5  . SSD 1 % cream Apply 1 application topically daily.    Marland Kitchen torsemide (DEMADEX) 100 MG tablet Take 1 tablet (100 mg total) by mouth 2 (two) times daily.    . traMADol (ULTRAM) 50 MG tablet Take 1  tablet (50 mg total) by mouth every 8 (eight) hours as needed for severe pain. 12 tablet 0  . TRAVATAN Z 0.004 % SOLN ophthalmic solution Place 1 drop into both eyes at bedtime.  6  . triamcinolone ointment (KENALOG) 0.1 % Apply 1 application topically daily.    Marland Kitchen warfarin (COUMADIN) 5 MG tablet Take 5mg  on Monday, Wednesday  and Friday, then 2.5mg  on TTSS 90 tablet 3   No facility-administered medications prior to visit.     Allergies:   Patient has no known allergies.   Social History   Socioeconomic History  . Marital status: Widowed    Spouse name: Not on file  . Number of children: Not on file  . Years of education: Not on file  . Highest education level: Not on file  Occupational History  . Not on file  Tobacco Use  . Smoking status: Never Smoker  .  Smokeless tobacco: Never Used  Vaping Use  . Vaping Use: Never used  Substance and Sexual Activity  . Alcohol use: No  . Drug use: No  . Sexual activity: Not on file  Other Topics Concern  . Not on file  Social History Narrative   Brother-in-law lives with patient.    Social Determinants of Health   Financial Resource Strain:   . Difficulty of Paying Living Expenses: Not on file  Food Insecurity:   . Worried About Charity fundraiser in the Last Year: Not on file  . Ran Out of Food in the Last Year: Not on file  Transportation Needs:   . Lack of Transportation (Medical): Not on file  . Lack of Transportation (Non-Medical): Not on file  Physical Activity:   . Days of Exercise per Week: Not on file  . Minutes of Exercise per Session: Not on file  Stress:   . Feeling of Stress : Not on file  Social Connections:   . Frequency of Communication with Friends and Family: Not on file  . Frequency of Social Gatherings with Friends and Family: Not on file  . Attends Religious Services: Not on file  . Active Member of Clubs or Organizations: Not on file  . Attends Archivist Meetings: Not on file  . Marital Status: Not on file     Family History:  The patient's ***family history is not on file.   Review of Systems:   Please see the history of present illness.     General:  No chills, fever, night sweats or weight changes.  Cardiovascular:  No chest pain, dyspnea on exertion, edema, orthopnea, palpitations, paroxysmal nocturnal dyspnea. Dermatological: No rash, lesions/masses Respiratory: No cough, dyspnea Urologic: No hematuria, dysuria Abdominal:   No nausea, vomiting, diarrhea, bright red blood per rectum, melena, or hematemesis Neurologic:  No visual changes, wkns, changes in mental status. All other systems reviewed and are otherwise negative except as noted above.   Physical Exam:    VS:  There were no vitals taken for this visit.   General: Well developed,  well nourished,female appearing in no acute distress. Head: Normocephalic, atraumatic. Neck: No carotid bruits. JVD not elevated.  Lungs: Respirations regular and unlabored, without wheezes or rales.  Heart: ***Regular rate and rhythm. No S3 or S4.  No murmur, no rubs, or gallops appreciated. Abdomen: Appears non-distended. No obvious abdominal masses. Msk:  Strength and tone appear normal for age. No obvious joint deformities or effusions. Extremities: No clubbing or cyanosis. No edema.  Distal pedal pulses are 2+  bilaterally. Neuro: Alert and oriented X 3. Moves all extremities spontaneously. No focal deficits noted. Psych:  Responds to questions appropriately with a normal affect. Skin: No rashes or lesions noted  Wt Readings from Last 3 Encounters:  11/18/19 195 lb 1.7 oz (88.5 kg)  10/31/19 204 lb 3.2 oz (92.6 kg)  01/12/19 191 lb (86.6 kg)        Studies/Labs Reviewed:   EKG:  EKG is*** ordered today.  The ekg ordered today demonstrates ***  Recent Labs: 11/17/2019: ALT 13; B Natriuretic Peptide 1,351.0 11/19/2019: Magnesium 2.0 11/20/2019: BUN 110; Creatinine, Ser 2.79; Hemoglobin 9.8; Platelets 125; Potassium 3.7; Sodium 138   Lipid Panel    Component Value Date/Time   CHOL 142 02/14/2018 1142   TRIG 65 02/14/2018 1142   HDL 35 (L) 02/14/2018 1142   CHOLHDL 4.1 02/14/2018 1142   VLDL 13 02/14/2018 1142   LDLCALC 94 02/14/2018 1142    Additional studies/ records that were reviewed today include:  ***  Assessment:    No diagnosis found.   Plan:   In order of problems listed above:  1. ***    Shared Decision Making/Informed Consent:   {Are you ordering a CV Procedure (e.g. stress test, cath, DCCV, TEE, etc)?   Press F2        :[id number]}    Medication Adjustments/Labs and Tests Ordered: Current medicines are reviewed at length with the patient today.  Concerns regarding medicines are outlined above.  Medication changes, Labs and Tests ordered today  are listed in the Patient Instructions below. There are no Patient Instructions on file for this visit.   Signed, Erma Heritage, PA-C  01/01/2020 7:03 PM    [place] [address] [place], [place] [address] Phone: [phone] Fax: [phone]

## 2020-01-02 ENCOUNTER — Ambulatory Visit: Payer: Medicare Other | Admitting: Student

## 2020-01-29 NOTE — Progress Notes (Deleted)
Cardiology Office Note    Date:  01/29/2020   ID:  [name], DOB [birth year], MRN [id number]  PCP:  Lemmie Evens, MD  Cardiologist: Kate Sable, MD (Inactive) EPS: None  No chief complaint on file.   History of Present Illness:  Barbara Stone is a 85 y.o. female with history of chronic systolic CHF LVEF 66% on echo 2019 has declined ischemic evaluation in the past, moderate MR, persistent atrial fibrillation on Coumadin, hypertension, DM type II, CKD stage IV.  Patient hospitalized 10/2018 with syncope felt secondary to hypoglycemia with closed head injury facial abrasions contusions blood sugar 44.  CT without acute findings.  BNP was elevated at 1351 troponins flat.  Creatinine was 2.6.  Patient was hospitalized in [facility] hospital 12/19/2019 with GI bleed and was transferred to [facility] or [facility] but never was   Past Medical History:  Diagnosis Date  . Anxiety   . Atrial fibrillation ([place])   . Chronic systolic CHF (congestive heart failure) ([place])   . CKD (chronic kidney disease)   . Diabetes mellitus without complication ([place])   . Hypertension     Past Surgical History:  Procedure Laterality Date  . BACK SURGERY    . EYE SURGERY Left     Current Medications: No outpatient medications have been marked as taking for the 02/05/20 encounter (Appointment) with Imogene Burn, PA-C.     Allergies:   Patient has no known allergies.   Social History   Socioeconomic History  . Marital status: Widowed    Spouse name: Not on file  . Number of children: Not on file  . Years of education: Not on file  . Highest education level: Not on file  Occupational History  . Not on file  Tobacco Use  . Smoking status: Never Smoker  . Smokeless tobacco: Never Used  Vaping Use  . Vaping Use: Never used  Substance and Sexual Activity  . Alcohol use: No  . Drug use: No  . Sexual activity: Not on file  Other Topics Concern  . Not on file  Social History  Narrative   Brother-in-law lives with patient.    Social Determinants of Health   Financial Resource Strain: Not on file  Food Insecurity: Not on file  Transportation Needs: Not on file  Physical Activity: Not on file  Stress: Not on file  Social Connections: Not on file     Family History:  The patient's ***family history is not on file.   ROS:   Please see the history of present illness.    ROS All other systems reviewed and are negative.   PHYSICAL EXAM:   VS:  There were no vitals taken for this visit.  Physical Exam  GEN: Well nourished, well developed, in no acute distress  HEENT: normal  Neck: no JVD, carotid bruits, or masses Cardiac:RRR; no murmurs, rubs, or gallops  Respiratory:  clear to auscultation bilaterally, normal work of breathing GI: soft, nontender, nondistended, + BS Ext: without cyanosis, clubbing, or edema, Good distal pulses bilaterally MS: no deformity or atrophy  Skin: warm and dry, no rash Neuro:  Alert and Oriented x 3, Strength and sensation are intact Psych: euthymic mood, full affect  Wt Readings from Last 3 Encounters:  11/18/19 195 lb 1.7 oz (88.5 kg)  10/31/19 204 lb 3.2 oz (92.6 kg)  01/12/19 191 lb (86.6 kg)      Studies/Labs Reviewed:   EKG:  EKG is*** ordered today.  The  ekg ordered today demonstrates ***  Recent Labs: 11/17/2019: ALT 13; B Natriuretic Peptide 1,351.0 11/19/2019: Magnesium 2.0 11/20/2019: BUN 110; Creatinine, Ser 2.79; Hemoglobin 9.8; Platelets 125; Potassium 3.7; Sodium 138   Lipid Panel    Component Value Date/Time   CHOL 142 02/14/2018 1142   TRIG 65 02/14/2018 1142   HDL 35 (L) 02/14/2018 1142   CHOLHDL 4.1 02/14/2018 1142   VLDL 13 02/14/2018 1142   LDLCALC 94 02/14/2018 1142    Additional studies/ records that were reviewed today include:  Echocardiogram: 06/2017 Study Conclusions   - Left ventricle: The cavity size was normal. Wall thickness was    increased in a pattern of mild LVH. The  estimated ejection    fraction was 25%. Diffuse hypokinesis. There is moderate    hypokinesis of the apicalinferior and apical myocardium. The    study was not technically sufficient to allow evaluation of LV    diastolic dysfunction due to atrial fibrillation.  - Ventricular septum: Septal motion showed abnormal function and    dyssynergy.  - Aortic valve: Moderately calcified annulus. Probably trileaflet.    There was mild regurgitation.  - Mitral valve: Mildly calcified annulus. There was moderate    regurgitation directed posteriorly.  - Right atrium: Central venous pressure (est): 3 mm Hg.  - Tricuspid valve: There was moderate regurgitation.  - Pulmonary arteries: Systolic pressure was severely increased. PA    peak pressure: 70 mm Hg (S).  - Pericardium, extracardiac: There was no pericardial effusion.       Risk Assessment/Calculations:   {Does this patient have ATRIAL FIBRILLATION?:[phone]}     ASSESSMENT:    1. Chronic systolic CHF (congestive heart failure) (HCC)   2. Persistent atrial fibrillation ([place])   3. Essential hypertension   4. Valvular heart disease   5. Chronic kidney disease (CKD), stage IV (severe) (HCC)      PLAN:  In order of problems listed above: Chronic systolic CHF EF 65% on echo 06/2017 has declined ischemic evaluation  Persistent atrial fibrillation on Coumadin with GI bleed 11/2019 hospitalized at [facility] and was to transfer but never did  Hypertension  Moderate MR  CKD stage IV  Shared Decision Making/Informed Consent   {Are you ordering a CV Procedure (e.g. stress test, cath, DCCV, TEE, etc)?   Press F2        :[id number]}    Medication Adjustments/Labs and Tests Ordered: Current medicines are reviewed at length with the patient today.  Concerns regarding medicines are outlined above.  Medication changes, Labs and Tests ordered today are listed in the Patient Instructions below. There are no Patient Instructions on  file for this visit.   Sumner Boast, PA-C  01/29/2020 3:43 PM    [facility] [address], [place], [place]  [address] Phone: [phone]; Fax: [phone]

## 2020-02-05 ENCOUNTER — Ambulatory Visit: Payer: Medicare Other | Admitting: Physician Assistant

## 2020-02-05 DIAGNOSIS — I5022 Chronic systolic (congestive) heart failure: Secondary | ICD-10-CM

## 2020-02-05 DIAGNOSIS — I10 Essential (primary) hypertension: Secondary | ICD-10-CM

## 2020-02-05 DIAGNOSIS — I38 Endocarditis, valve unspecified: Secondary | ICD-10-CM

## 2020-02-05 DIAGNOSIS — I4819 Other persistent atrial fibrillation: Secondary | ICD-10-CM

## 2020-02-05 DIAGNOSIS — N184 Chronic kidney disease, stage 4 (severe): Secondary | ICD-10-CM

## 2020-02-16 ENCOUNTER — Ambulatory Visit: Payer: Self-pay | Admitting: *Deleted

## 2020-03-26 DEATH — deceased

## 2020-06-10 IMAGING — DX DG HAND COMPLETE 3+V*L*
3 series · 3 of 3 positions shown · non-contrast
Comparison: 11/14/2016

CLINICAL DATA: 82-year-old with swelling and pain and left index
finger.

EXAM:
LEFT HAND - COMPLETE 3+ VIEW

[hand pa]
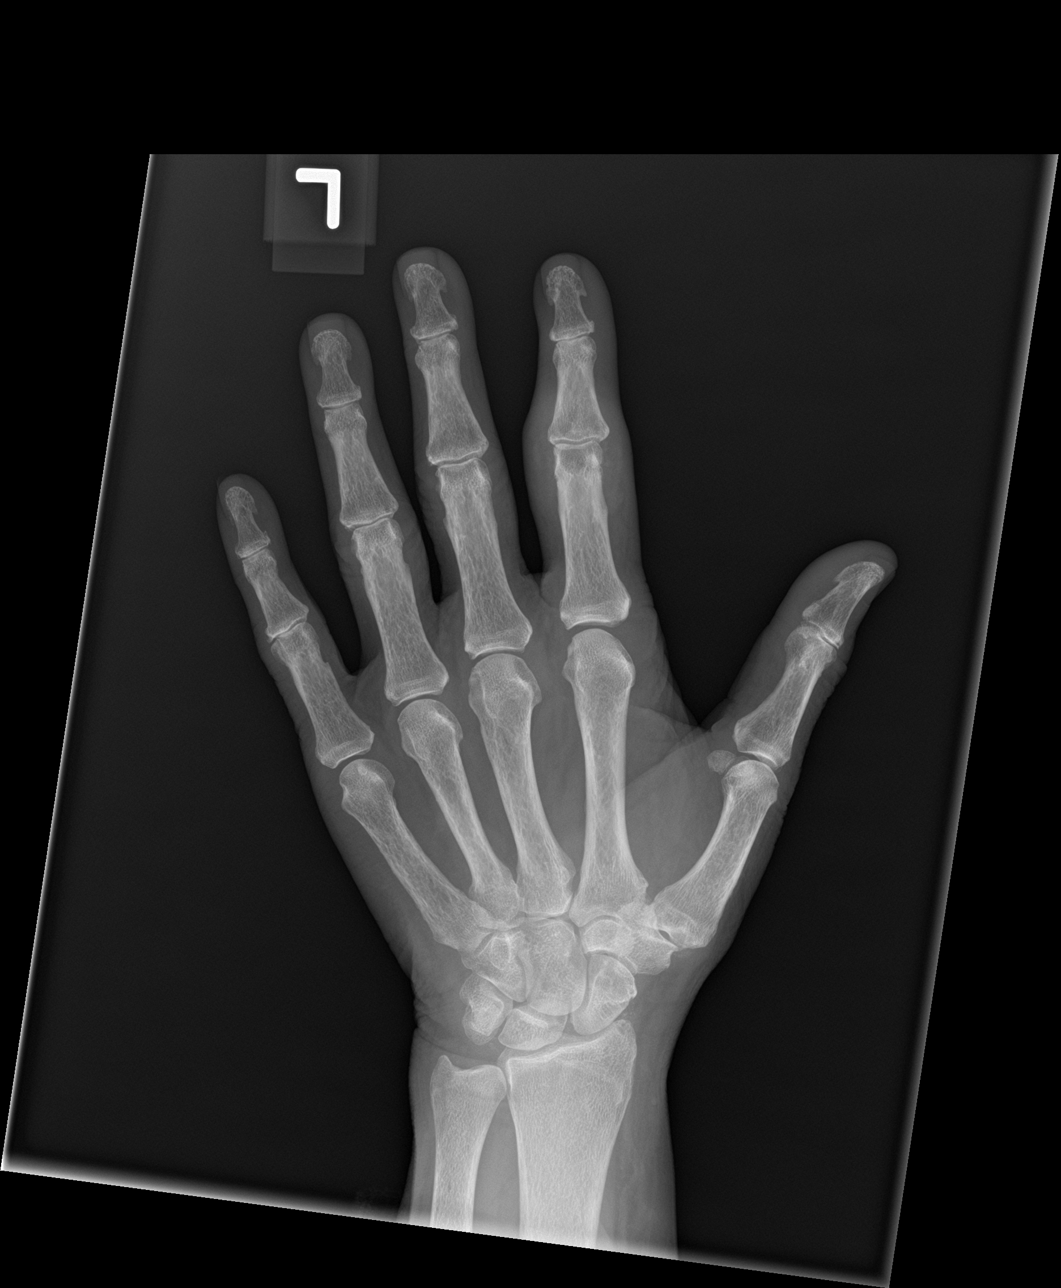

[hand obl]
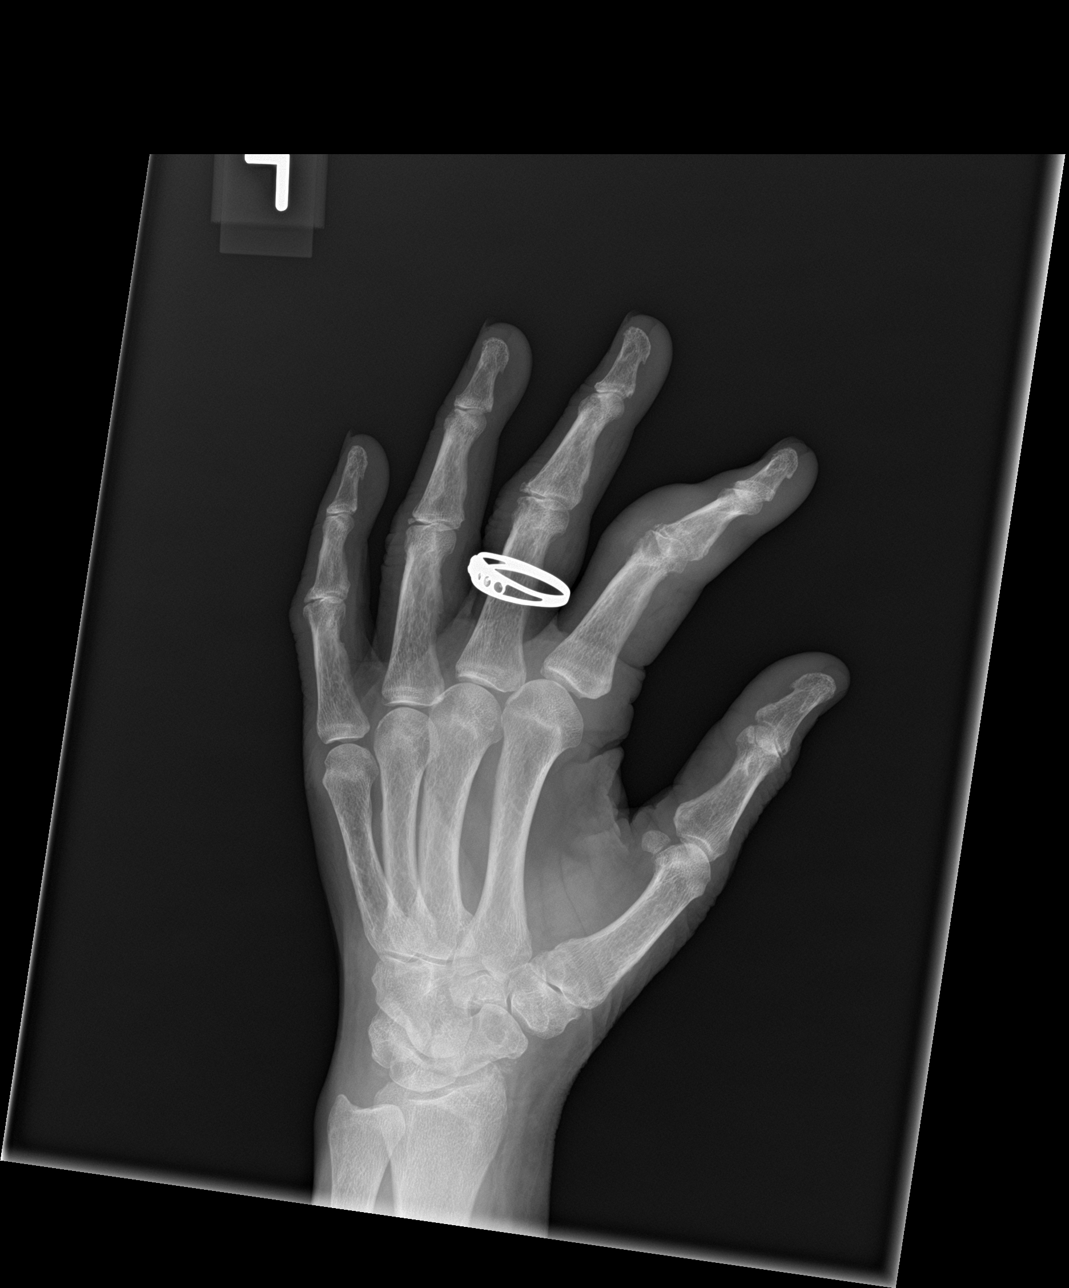

[hand lat]
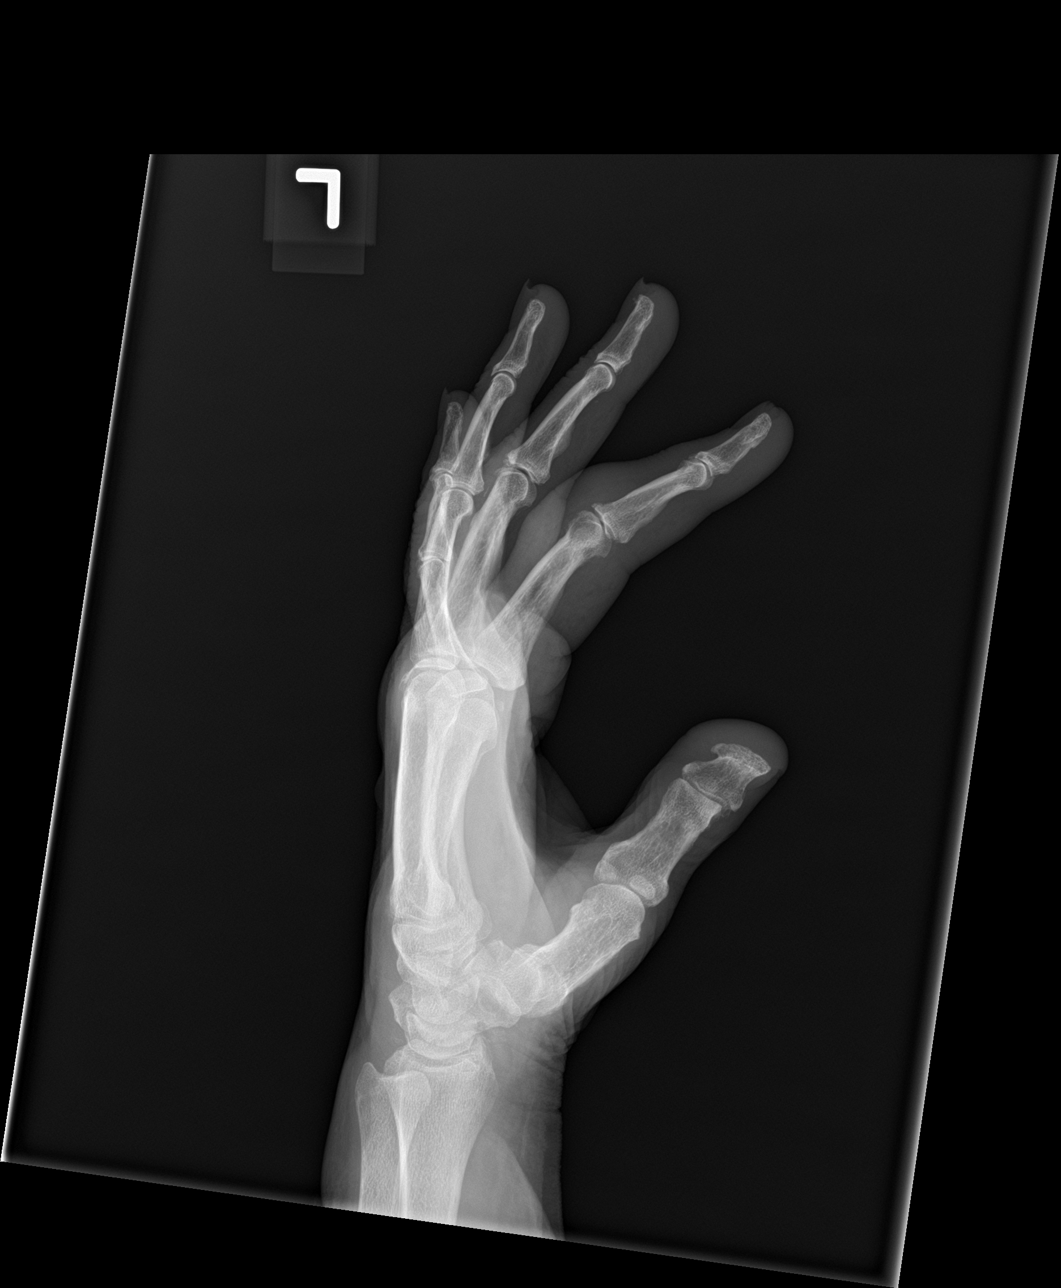

[3 of 3 positions shown; findings below may reference images not displayed]

FINDINGS: Focal soft tissue swelling involving the index finger PIP joint.
There is a small bone fragment or loose body near the dorsal aspect
of the index finger PIP joint which appears chronic. However, there
is mild irregularity and haziness involving the distal aspect of the
proximal phalanx that appears to be new and concern for a small area
of periosteal reaction or cortical irregularity along the ulnar
aspect of the proximal phalanx. There is no significant joint space
narrowing at the index finger PIP joint. Mild joint space narrowing
at the index finger DIP joint appears chronic. Negative for fracture
or dislocation.
IMPRESSION: Significant soft tissue swelling around the index finger PIP joint.
Although there is no significant arthropathy at this level, there is
concern for new mild cortical irregularity involving the distal
aspect of the index finger proximal phalanx. Findings raise concern
for an inflammatory or infectious process and consider further
characterization with an MRI.
